# Patient Record
Sex: Female | Born: 1970 | Race: Black or African American | Hispanic: No | Smoking: Never smoker
Health system: Southern US, Community
[De-identification: ages and names within clinical notes are randomized; demographics above are authoritative.]

## PROBLEM LIST (undated history)

## (undated) DIAGNOSIS — F200 Paranoid schizophrenia: Secondary | ICD-10-CM

## (undated) DIAGNOSIS — B009 Herpesviral infection, unspecified: Secondary | ICD-10-CM

## (undated) DIAGNOSIS — D573 Sickle-cell trait: Secondary | ICD-10-CM

## (undated) DIAGNOSIS — F32A Depression, unspecified: Secondary | ICD-10-CM

## (undated) DIAGNOSIS — R945 Abnormal results of liver function studies: Secondary | ICD-10-CM

## (undated) DIAGNOSIS — K8020 Calculus of gallbladder without cholecystitis without obstruction: Secondary | ICD-10-CM

## (undated) DIAGNOSIS — B20 Human immunodeficiency virus [HIV] disease: Secondary | ICD-10-CM

## (undated) DIAGNOSIS — F329 Major depressive disorder, single episode, unspecified: Secondary | ICD-10-CM

## (undated) DIAGNOSIS — I10 Essential (primary) hypertension: Secondary | ICD-10-CM

## (undated) DIAGNOSIS — F209 Schizophrenia, unspecified: Secondary | ICD-10-CM

## (undated) DIAGNOSIS — R7989 Other specified abnormal findings of blood chemistry: Secondary | ICD-10-CM

## (undated) DIAGNOSIS — F29 Unspecified psychosis not due to a substance or known physiological condition: Secondary | ICD-10-CM

## (undated) HISTORY — DX: Depression, unspecified: F32.A

## (undated) HISTORY — DX: Sickle-cell trait: D57.3

## (undated) HISTORY — DX: Major depressive disorder, single episode, unspecified: F32.9

## (undated) HISTORY — DX: Paranoid schizophrenia: F20.0

## (undated) HISTORY — DX: Herpesviral infection, unspecified: B00.9

---

## 1999-05-28 ENCOUNTER — Encounter (INDEPENDENT_AMBULATORY_CARE_PROVIDER_SITE_OTHER): Payer: Self-pay | Admitting: *Deleted

## 1999-05-28 LAB — CONVERTED CEMR LAB
CD4 Count: 1140 microliters
CD4 T Cell Abs: 1140

## 2000-03-13 ENCOUNTER — Encounter: Payer: Self-pay | Admitting: Emergency Medicine

## 2000-03-13 ENCOUNTER — Emergency Department (HOSPITAL_COMMUNITY): Admission: EM | Admit: 2000-03-13 | Discharge: 2000-03-13 | Payer: Self-pay | Admitting: Emergency Medicine

## 2001-04-09 ENCOUNTER — Other Ambulatory Visit: Admission: RE | Admit: 2001-04-09 | Discharge: 2001-04-09 | Payer: Self-pay | Admitting: Obstetrics and Gynecology

## 2002-04-13 ENCOUNTER — Other Ambulatory Visit: Admission: RE | Admit: 2002-04-13 | Discharge: 2002-04-13 | Payer: Self-pay | Admitting: Obstetrics and Gynecology

## 2003-04-15 ENCOUNTER — Emergency Department (HOSPITAL_COMMUNITY): Admission: EM | Admit: 2003-04-15 | Discharge: 2003-04-15 | Payer: Self-pay | Admitting: Emergency Medicine

## 2003-05-17 ENCOUNTER — Other Ambulatory Visit: Admission: RE | Admit: 2003-05-17 | Discharge: 2003-05-17 | Payer: Self-pay | Admitting: Obstetrics and Gynecology

## 2005-07-05 ENCOUNTER — Ambulatory Visit: Payer: Self-pay | Admitting: Infectious Diseases

## 2005-08-01 ENCOUNTER — Ambulatory Visit: Payer: Self-pay | Admitting: Internal Medicine

## 2005-08-01 ENCOUNTER — Encounter: Admission: RE | Admit: 2005-08-01 | Discharge: 2005-08-01 | Payer: Self-pay | Admitting: Infectious Diseases

## 2005-08-01 ENCOUNTER — Encounter (INDEPENDENT_AMBULATORY_CARE_PROVIDER_SITE_OTHER): Payer: Self-pay | Admitting: *Deleted

## 2005-08-01 LAB — CONVERTED CEMR LAB
CD4 Count: 660 microliters
HIV 1 RNA Quant: 399 copies/mL

## 2005-08-08 ENCOUNTER — Encounter: Payer: Self-pay | Admitting: Internal Medicine

## 2005-08-14 ENCOUNTER — Ambulatory Visit: Payer: Self-pay | Admitting: Internal Medicine

## 2005-10-23 ENCOUNTER — Encounter (INDEPENDENT_AMBULATORY_CARE_PROVIDER_SITE_OTHER): Payer: Self-pay | Admitting: *Deleted

## 2005-10-23 ENCOUNTER — Ambulatory Visit: Payer: Self-pay | Admitting: Internal Medicine

## 2005-10-23 ENCOUNTER — Encounter: Admission: RE | Admit: 2005-10-23 | Discharge: 2005-10-23 | Payer: Self-pay | Admitting: Internal Medicine

## 2005-10-23 LAB — CONVERTED CEMR LAB
CD4 Count: 700 microliters
HIV 1 RNA Quant: 49 copies/mL

## 2005-11-06 ENCOUNTER — Ambulatory Visit: Payer: Self-pay | Admitting: Internal Medicine

## 2005-11-20 ENCOUNTER — Ambulatory Visit: Payer: Self-pay | Admitting: Internal Medicine

## 2005-12-17 ENCOUNTER — Encounter: Payer: Self-pay | Admitting: Internal Medicine

## 2005-12-27 DIAGNOSIS — F32A Depression, unspecified: Secondary | ICD-10-CM | POA: Insufficient documentation

## 2005-12-27 DIAGNOSIS — F329 Major depressive disorder, single episode, unspecified: Secondary | ICD-10-CM

## 2005-12-27 DIAGNOSIS — B20 Human immunodeficiency virus [HIV] disease: Secondary | ICD-10-CM | POA: Insufficient documentation

## 2006-01-20 ENCOUNTER — Encounter: Admission: RE | Admit: 2006-01-20 | Discharge: 2006-01-20 | Payer: Self-pay | Admitting: Internal Medicine

## 2006-01-20 ENCOUNTER — Encounter (INDEPENDENT_AMBULATORY_CARE_PROVIDER_SITE_OTHER): Payer: Self-pay | Admitting: *Deleted

## 2006-01-20 ENCOUNTER — Ambulatory Visit: Payer: Self-pay | Admitting: Internal Medicine

## 2006-01-20 LAB — CONVERTED CEMR LAB
CD4 Count: 690 microliters
HIV 1 RNA Quant: 49 copies/mL

## 2006-02-21 ENCOUNTER — Encounter: Payer: Self-pay | Admitting: Internal Medicine

## 2006-04-21 ENCOUNTER — Encounter (INDEPENDENT_AMBULATORY_CARE_PROVIDER_SITE_OTHER): Payer: Self-pay | Admitting: *Deleted

## 2006-04-21 LAB — CONVERTED CEMR LAB: Pap Smear: NEGATIVE

## 2006-05-04 ENCOUNTER — Encounter (INDEPENDENT_AMBULATORY_CARE_PROVIDER_SITE_OTHER): Payer: Self-pay | Admitting: *Deleted

## 2006-07-09 ENCOUNTER — Encounter: Payer: Self-pay | Admitting: Internal Medicine

## 2007-07-29 ENCOUNTER — Encounter: Admission: RE | Admit: 2007-07-29 | Discharge: 2007-07-29 | Payer: Self-pay | Admitting: Internal Medicine

## 2007-07-29 ENCOUNTER — Ambulatory Visit: Payer: Self-pay | Admitting: Internal Medicine

## 2007-07-29 LAB — CONVERTED CEMR LAB
ALT: 9 units/L (ref 0–35)
AST: 11 units/L (ref 0–37)
Albumin: 4.1 g/dL (ref 3.5–5.2)
Alkaline Phosphatase: 60 units/L (ref 39–117)
BUN: 12 mg/dL (ref 6–23)
Basophils Absolute: 0 10*3/uL (ref 0.0–0.1)
Basophils Relative: 1 % (ref 0–1)
CO2: 20 meq/L (ref 19–32)
Calcium: 8.9 mg/dL (ref 8.4–10.5)
Chlamydia, Swab/Urine, PCR: NEGATIVE
Chloride: 113 meq/L — ABNORMAL HIGH (ref 96–112)
Creatinine, Ser: 0.83 mg/dL (ref 0.40–1.20)
Eosinophils Absolute: 0 10*3/uL (ref 0.0–0.7)
Eosinophils Relative: 1 % (ref 0–5)
GC Probe Amp, Urine: NEGATIVE
Glucose, Bld: 89 mg/dL (ref 70–99)
HCT: 36.2 % (ref 36.0–46.0)
HIV 1 RNA Quant: <50 copies/mL (ref <50)
HIV-1 RNA Quant, Log: <1.7 (ref <1.70)
Hemoglobin: 12.2 g/dL (ref 12.0–15.0)
Lymphocytes Relative: 43 % (ref 12–46)
Lymphs Abs: 1.6 10*3/uL (ref 0.7–4.0)
MCHC: 33.7 g/dL (ref 30.0–36.0)
MCV: 87 fL (ref 78.0–100.0)
Monocytes Absolute: 0.4 10*3/uL (ref 0.1–1.0)
Monocytes Relative: 11 % (ref 3–12)
Neutro Abs: 1.6 10*3/uL — ABNORMAL LOW (ref 1.7–7.7)
Neutrophils Relative %: 45 % (ref 43–77)
Platelets: 239 10*3/uL (ref 150–400)
Potassium: 3.6 meq/L (ref 3.5–5.3)
RBC: 4.16 M/uL (ref 3.87–5.11)
RDW: 13.8 % (ref 11.5–15.5)
Sodium: 142 meq/L (ref 135–145)
Total Bilirubin: 0.4 mg/dL (ref 0.3–1.2)
Total Protein: 7.4 g/dL (ref 6.0–8.3)
WBC: 3.7 10*3/uL — ABNORMAL LOW (ref 4.0–10.5)

## 2007-08-06 ENCOUNTER — Encounter (INDEPENDENT_AMBULATORY_CARE_PROVIDER_SITE_OTHER): Payer: Self-pay | Admitting: *Deleted

## 2007-08-11 ENCOUNTER — Encounter (INDEPENDENT_AMBULATORY_CARE_PROVIDER_SITE_OTHER): Payer: Self-pay | Admitting: *Deleted

## 2007-08-12 ENCOUNTER — Ambulatory Visit: Payer: Self-pay | Admitting: Internal Medicine

## 2007-11-19 ENCOUNTER — Telehealth: Payer: Self-pay | Admitting: Internal Medicine

## 2008-09-06 ENCOUNTER — Ambulatory Visit: Payer: Self-pay | Admitting: Internal Medicine

## 2008-09-06 LAB — CONVERTED CEMR LAB
ALT: 13 units/L (ref 0–35)
AST: 15 units/L (ref 0–37)
Albumin: 4 g/dL (ref 3.5–5.2)
Alkaline Phosphatase: 63 units/L (ref 39–117)
BUN: 16 mg/dL (ref 6–23)
Basophils Absolute: 0 10*3/uL (ref 0.0–0.1)
Basophils Relative: 0 % (ref 0–1)
CO2: 20 meq/L (ref 19–32)
Calcium: 8.8 mg/dL (ref 8.4–10.5)
Chloride: 110 meq/L (ref 96–112)
Creatinine, Ser: 0.83 mg/dL (ref 0.40–1.20)
Eosinophils Absolute: 0 10*3/uL (ref 0.0–0.7)
Eosinophils Relative: 1 % (ref 0–5)
Glucose, Bld: 77 mg/dL (ref 70–99)
HCT: 34 % — ABNORMAL LOW (ref 36.0–46.0)
HIV 1 RNA Quant: <48 copies/mL (ref <48)
HIV-1 RNA Quant, Log: <1.68 (ref <1.68)
Hemoglobin: 11.4 g/dL — ABNORMAL LOW (ref 12.0–15.0)
Lymphocytes Relative: 32 % (ref 12–46)
Lymphs Abs: 1.1 10*3/uL (ref 0.7–4.0)
MCHC: 33.5 g/dL (ref 30.0–36.0)
MCV: 84.8 fL (ref 78.0–100.0)
Monocytes Absolute: 0.3 10*3/uL (ref 0.1–1.0)
Monocytes Relative: 10 % (ref 3–12)
Neutro Abs: 2 10*3/uL (ref 1.7–7.7)
Neutrophils Relative %: 57 % (ref 43–77)
Platelets: 229 10*3/uL (ref 150–400)
Potassium: 4.3 meq/L (ref 3.5–5.3)
RBC: 4.01 M/uL (ref 3.87–5.11)
RDW: 14.9 % (ref 11.5–15.5)
Sodium: 139 meq/L (ref 135–145)
Total Bilirubin: 0.4 mg/dL (ref 0.3–1.2)
Total Protein: 7 g/dL (ref 6.0–8.3)
WBC: 3.4 10*3/uL — ABNORMAL LOW (ref 4.0–10.5)

## 2008-09-21 ENCOUNTER — Ambulatory Visit: Payer: Self-pay | Admitting: Internal Medicine

## 2009-05-05 ENCOUNTER — Ambulatory Visit: Payer: Self-pay | Admitting: Internal Medicine

## 2009-05-05 DIAGNOSIS — N6459 Other signs and symptoms in breast: Secondary | ICD-10-CM | POA: Insufficient documentation

## 2009-05-05 LAB — CONVERTED CEMR LAB
HIV 1 RNA Quant: <48 copies/mL (ref <48)
HIV-1 RNA Quant, Log: <1.68 (ref <1.68)
Pap Smear: NEGATIVE

## 2009-05-09 ENCOUNTER — Encounter: Payer: Self-pay | Admitting: Internal Medicine

## 2009-05-10 DIAGNOSIS — E229 Hyperfunction of pituitary gland, unspecified: Secondary | ICD-10-CM | POA: Insufficient documentation

## 2009-05-10 LAB — CONVERTED CEMR LAB
ALT: 9 units/L (ref 0–35)
AST: 15 units/L (ref 0–37)
Albumin: 3.7 g/dL (ref 3.5–5.2)
Alkaline Phosphatase: 106 units/L (ref 39–117)
BUN: 18 mg/dL (ref 6–23)
Basophils Absolute: 0 10*3/uL (ref 0.0–0.1)
Basophils Relative: 0 % (ref 0–1)
CO2: 23 meq/L (ref 19–32)
Calcium: 8.8 mg/dL (ref 8.4–10.5)
Chloride: 108 meq/L (ref 96–112)
Creatinine, Ser: 0.81 mg/dL (ref 0.40–1.20)
Eosinophils Absolute: 0.1 10*3/uL (ref 0.0–0.7)
Eosinophils Relative: 1 % (ref 0–5)
Glucose, Bld: 89 mg/dL (ref 70–99)
HCT: 37.2 % (ref 36.0–46.0)
Hemoglobin: 12.3 g/dL (ref 12.0–15.0)
Lymphocytes Relative: 33 % (ref 12–46)
Lymphs Abs: 1.7 10*3/uL (ref 0.7–4.0)
MCHC: 33.1 g/dL (ref 30.0–36.0)
MCV: 84.9 fL (ref >=78.0)
Monocytes Absolute: 0.5 10*3/uL (ref 0.1–1.0)
Monocytes Relative: 9 % (ref 3–12)
Neutro Abs: 3 10*3/uL (ref 1.7–7.7)
Neutrophils Relative %: 57 % (ref 43–77)
Platelets: 203 10*3/uL (ref 150–400)
Potassium: 3.7 meq/L (ref 3.5–5.3)
Prolactin: 99.6 ng/mL
RBC: 4.38 M/uL (ref 3.87–5.11)
RDW: 13.6 % (ref 11.5–15.5)
Sodium: 140 meq/L (ref 135–145)
Total Bilirubin: 0.3 mg/dL (ref 0.3–1.2)
Total Protein: 6.7 g/dL (ref 6.0–8.3)
WBC: 5.3 10*3/uL (ref 4.0–10.5)

## 2009-05-12 ENCOUNTER — Encounter: Payer: Self-pay | Admitting: Internal Medicine

## 2009-05-16 LAB — CONVERTED CEMR LAB: hCG, Beta Chain, Quant, S: <2 milliintl units/mL

## 2009-05-17 ENCOUNTER — Encounter: Payer: Self-pay | Admitting: Internal Medicine

## 2009-05-19 ENCOUNTER — Ambulatory Visit: Payer: Self-pay | Admitting: Internal Medicine

## 2009-06-23 ENCOUNTER — Ambulatory Visit (HOSPITAL_COMMUNITY): Admission: RE | Admit: 2009-06-23 | Discharge: 2009-06-23 | Payer: Self-pay | Admitting: Internal Medicine

## 2009-12-07 ENCOUNTER — Ambulatory Visit: Payer: Self-pay | Admitting: Internal Medicine

## 2009-12-07 LAB — CONVERTED CEMR LAB
ALT: 12 units/L (ref 0–35)
AST: 13 units/L (ref 0–37)
Albumin: 3.9 g/dL (ref 3.5–5.2)
Alkaline Phosphatase: 137 units/L — ABNORMAL HIGH (ref 39–117)
BUN: 12 mg/dL (ref 6–23)
Basophils Absolute: 0 10*3/uL (ref 0.0–0.1)
Basophils Relative: 0 % (ref 0–1)
CO2: 26 meq/L (ref 19–32)
Calcium: 8.7 mg/dL (ref 8.4–10.5)
Chloride: 107 meq/L (ref 96–112)
Creatinine, Ser: 0.77 mg/dL (ref 0.40–1.20)
Eosinophils Absolute: 0.1 10*3/uL (ref 0.0–0.7)
Eosinophils Relative: 1 % (ref 0–5)
Glucose, Bld: 89 mg/dL (ref 70–99)
HCT: 36.3 % (ref 36.0–46.0)
HIV 1 RNA Quant: <20 copies/mL (ref <20)
HIV-1 RNA Quant, Log: <1.3 (ref <1.30)
Hemoglobin: 12 g/dL (ref 12.0–15.0)
Lymphocytes Relative: 27 % (ref 12–46)
Lymphs Abs: 1.7 10*3/uL (ref 0.7–4.0)
MCHC: 33.1 g/dL (ref 30.0–36.0)
MCV: 83.1 fL (ref 78.0–100.0)
Monocytes Absolute: 0.7 10*3/uL (ref 0.1–1.0)
Monocytes Relative: 10 % (ref 3–12)
Neutro Abs: 3.9 10*3/uL (ref 1.7–7.7)
Neutrophils Relative %: 62 % (ref 43–77)
Platelets: 229 10*3/uL (ref 150–400)
Potassium: 3.7 meq/L (ref 3.5–5.3)
RBC: 4.37 M/uL (ref 3.87–5.11)
RDW: 14.9 % (ref 11.5–15.5)
Sodium: 141 meq/L (ref 135–145)
Total Bilirubin: 0.4 mg/dL (ref 0.3–1.2)
Total Protein: 7.1 g/dL (ref 6.0–8.3)
WBC: 6.4 10*3/uL (ref 4.0–10.5)

## 2010-01-17 ENCOUNTER — Encounter (INDEPENDENT_AMBULATORY_CARE_PROVIDER_SITE_OTHER): Payer: Self-pay | Admitting: *Deleted

## 2010-03-18 ENCOUNTER — Encounter: Payer: Self-pay | Admitting: Internal Medicine

## 2010-03-29 NOTE — Miscellaneous (Signed)
  Clinical Lists Changes  Observations: Added new observation of YEARAIDSPOS: 2007  (01/17/2010 10:53) Added new observation of HIV STATUS: CDC-defined AIDS  (01/17/2010 10:53)

## 2010-03-29 NOTE — Miscellaneous (Signed)
Summary: Orders Update  Clinical Lists Changes  Orders: Added new Test order of T-CBC w/Diff [phone]) - Signed Added new Test order of T-CD4 [phone]) - Signed Added new Test order of T-Comprehensive Metabolic Panel [id number]) - Signed Added new Test order of T-HIV Viral Load [phone]) - Signed Added new Test order of T-RPR (Syphilis) ([id number]) - Signed

## 2010-03-29 NOTE — Letter (Signed)
Summary: OPCID: Flow Sheet  OPCID: Flow Sheet   Imported By: Florinda Marker 09/22/2008 15:33:13  _____________________________________________________________________  External Attachment:    Type:   Image     Comment:   External Document

## 2010-03-29 NOTE — Assessment & Plan Note (Signed)
Summary: CHECKUP/SB.   CC:  check up and Depression.  History of Present Illness: Pt here for lab results.  She wants to have another baby. She currently does not have a partner. She will look into artificial insemination. She did a rapid HIV test at home and it was negative.  Depression History:      The patient denies a depressed mood most of the day and a diminished interest in her usual daily activities.         Preventive Screening-Counseling & Management  Alcohol-Tobacco     Alcohol drinks/day: 0     Smoking Status: never  Caffeine-Diet-Exercise     Caffeine use/day: 0     Does Patient Exercise: yes     Type of exercise: walking     Exercise (avg: min/session): 30-60     Times/week: 5  Safety-Violence-Falls     Seat Belt Use: yes   Current Allergies (reviewed today): No known allergies  Review of Systems  The patient denies anorexia, fever, and weight loss.    Vital Signs:  Patient profile:   40 year old female Height:      67 inches (170.18 cm) Weight:      155.7 pounds (70.77 kg) BMI:     24.47 Temp:     97.9 degrees F (36.61 degrees C) oral Pulse rate:   83 / minute BP sitting:   100 / 64  (left arm)  Vitals Entered By: Baxter Hire) (September 21, 2008 10:09 AM) CC: check up, Depression Is Patient Diabetic? No Pain Assessment Patient in pain? no      Nutritional Status BMI of 19 -24 = normal Nutritional Status Detail appetite is good per patient  Have you ever been in a relationship where you felt threatened, hurt or afraid?No   Does patient need assistance? Functional Status Self care Ambulation Normal    Physical Exam  General:  alert, well-developed, well-nourished, and well-hydrated.   Head:  normocephalic and atraumatic.   Mouth:  pharynx pink and moist.   Lungs:  normal breath sounds.     Impression & Recommendations:  Problem # 1:  HIV DISEASE (ICD-042) Pt currently not on treatment.  We confirmed that she is HIV postive by  reviewing 2 postive wester blots in her chart.  I do not have an explanation for the negative rapid test unless it was not done correctly.  I discussed the need for using condoms. She will repeat her lbs in 6 months. Diagnostics Reviewed:  CD4: 480 (09/07/2008)   WBC: 3.4 (09/06/2008)   Hgb: 11.4 (09/06/2008)   HCT: 34.0 (09/06/2008)   Platelets: 229 (09/06/2008) HIV-1 RNA: <48 copies/mL (09/06/2008)   HBSAg: NO (04/21/2006)  Other Orders: Est. Patient Level III (48546) Future Orders: T-CD4SP ([facility]) (CD4SP) ... 03/20/2009 T-HIV Viral Load [id number]) ... 03/20/2009 T-Comprehensive Metabolic Panel [id number]) ... 03/20/2009 T-CBC w/Diff ([id number]) ... 03/20/2009  Patient Instructions: 1)  Please schedule a follow-up appointment in 6 months,2 weeks after labs. 2)  Schedule in PAP clinic
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
## 2010-03-29 NOTE — Progress Notes (Signed)
Summary: [place] another state  Phone Note Call from Patient   Caller: Patient Summary of Call: Patient called to let us know that she has moved to [place] and still wants to receive care here.  Her new number is [phone]. Initial call taken by: Paulo Fruit  BS,CPht II,MPH,  November 19, 2007 4:49 PM

## 2010-03-29 NOTE — Letter (Signed)
Summary: Adriana Fernandez  Adriana Fernandez   Imported By: Randon Goldsmith 09/08/2006 06:40:57  _____________________________________________________________________  External Attachment:    Type:   Image     Comment:   External Document

## 2010-03-29 NOTE — Progress Notes (Signed)
Summary: Office Visit  Office Visit   Imported By: Randon Goldsmith 09/08/2006 06:48:32  _____________________________________________________________________  External Attachment:    Type:   Image     Comment:   External Document

## 2010-03-29 NOTE — Miscellaneous (Signed)
Summary: clinical update/ryan white  Clinical Lists Changes  Observations: Added new observation of PAYOR: No Insurance (08/06/2007 15:09) Added new observation of AIDSDAP: Pending (08/06/2007 15:09) Added new observation of INCOMESOURCE: none (08/06/2007 15:09) Added new observation of HOUSEINCOME: 0  (08/06/2007 15:09) Added new observation of FINASSESSDT: 08/04/2007  (08/06/2007 15:09)

## 2010-03-29 NOTE — Miscellaneous (Signed)
Summary: Lab Orders  Clinical Lists Changes  Orders: Added new Test order of T-CBC w/Diff [id number]) - Signed Added new Test order of T-CD4 [id number]) - Signed Added new Test order of T-Comprehensive Metabolic Panel [id number]) - Signed Added new Test order of T-HIV Viral Load [phone]) - Signed Added new Test order of T-Chlamydia  Probe, urine [id number]) - Signed Added new Test order of T-GC Probe, urine [id number]) - Signed Added new Test order of T-RPR (Syphilis) ([id number]) - Signed

## 2010-03-29 NOTE — Letter (Signed)
Summary: Results Follow-up Letter  [facility]  [address]   [place], [place] [address]   Phone: [phone]  Fax: [phone]          May 09, 2009  [address] [place], [place]  [address]  Dear Ms. Mayford Knife,   The following are the results of your recent test(s):  Test     Result    Pap Smear     Normal_XXX___  Not Normal_____  Comments:  I will see you in a year for your next PAP Smear.  Thank you for allowing me to do your PAP smear.  Sincerely,    Jennet Maduro RN [facility]

## 2010-03-29 NOTE — Miscellaneous (Signed)
Summary: Orders Update  Clinical Lists Changes  Orders: Added new Test order of CT with Contrast (CT w/ contrast) - Signed 

## 2010-03-29 NOTE — Assessment & Plan Note (Signed)
Summary: 2WK F/U/VS   CC:  follow-up visit, lab results, complaining of diarrhea x 1-2 months, and Depression.  History of Present Illness: Pt here to get lab results. She has been having some diarrhea. She thinks it is due to her Psych meds.  She does c/o HAs.  No double vision.  Depression History:      The patient denies a depressed mood most of the day and a diminished interest in her usual daily activities.        The patient denies that she feels like life is not worth living, denies that she wishes that she were dead, and denies that she has thought about ending her life.        Preventive Screening-Counseling & Management  Alcohol-Tobacco     Alcohol drinks/day: 0     Smoking Status: never     Passive Smoke Exposure: yes  Caffeine-Diet-Exercise     Caffeine use/day: soda     Does Patient Exercise: yes     Type of exercise: walking goes to gym     Exercise (avg: min/session): 30-60     Times/week: 5      Sexual History:  currently monogamous.     Updated Prior Medication List: INVEGA 3 MG XR24H-TAB (PALIPERIDONE) one tablet one time a day LEXAPRO 10 MG TABS (ESCITALOPRAM OXALATE) take one tablet one time a day APLENZIN 174 MG XR24H-TAB (BUPROPION HBR) take one tablet one time a day CEREFOLIN 07-26-48-5 MG TABS (L-METHYLFOLATE-B12-B6-B2) take one tablet one time a day  Current Allergies (reviewed today): No known allergies  Past History:  Past Medical History: Last updated: 12/27/2005 Depression HIV disease  Review of Systems       The patient complains of headaches.  The patient denies anorexia, fever, weight loss, and vision loss.    Vital Signs:  Patient profile:   40 year old female Height:      67 inches (170.18 cm) Weight:      225.0 pounds (102.27 kg) BMI:     35.37 Temp:     97.8 degrees F (36.56 degrees C) oral Pulse rate:   77 / minute BP sitting:   108 / 65  (left arm)  Vitals Entered By: Wendall Mola CMA Duncan Dull) (May 19, 2009 3:27  PM) CC: follow-up visit, lab results, complaining of diarrhea x 1-2 months, Depression Is Patient Diabetic? No Pain Assessment Patient in pain? no      Nutritional Status BMI of > 30 = obese Nutritional Status Detail appetite "normal"  Have you ever been in a relationship where you felt threatened, hurt or afraid?No   Does patient need assistance? Functional Status Self care Ambulation Normal Comments no missed doses of meds per patient   Physical Exam  General:  alert, well-developed, well-nourished, and well-hydrated.   Head:  normocephalic and atraumatic.   Neurologic:  alert & oriented X3, cranial nerves II-XII intact, strength normal in all extremities, and gait normal.      Impression & Recommendations:  Problem # 1:  HIV DISEASE (ICD-042) Pt currently not on therapy and does not need to be at this time. She will f/u for labs in 6 months. Diagnostics Reviewed:  HIV: HIV positive - not AIDS (09/21/2008)   CD4: 820 (05/05/2009)   WBC: 5.3 (05/05/2009)   Hgb: 12.3 (05/05/2009)   HCT: 37.2 (05/05/2009)   Platelets: 203 (05/05/2009) HIV-1 RNA: <48 copies/mL (05/05/2009)   HBSAg: NO (04/21/2006)  Problem # 2:  HYPERPROLACTINEMIA (ICD-253.1) will obtain CT  scan to r/o pituitary adenoma.  Medications Added to Medication List This Visit: 1)  Invega 3 Mg Xr24h-tab (Paliperidone) .... One tablet one time a day 2)  Lexapro 10 Mg Tabs (Escitalopram oxalate) .... Take one tablet one time a day 3)  Aplenzin 174 Mg Xr24h-tab (Bupropion hbr) .... Take one tablet one time a day 4)  Cerefolin 07-26-48-5 Mg Tabs (L-methylfolate-b12-b6-b2) .... Take one tablet one time a day  Other Orders: Est. Patient Level III ([address]) Future Orders: T-CD4SP ([facility]) (CD4SP) ... 11/15/2009 T-HIV Viral Load [id number]) ... 11/15/2009 T-Comprehensive Metabolic Panel [id number]) ... 11/15/2009 T-CBC w/Diff ([id number]) ... 11/15/2009  Patient Instructions: 1)  Please schedule a follow-up  appointment in 6 months, 2 weeks after labs.  Process Orders Check Orders Results:     [facility]: ABN not required for this insurance Tests Sent for requisitioning (May 19, 2009 3:45 PM):     11/15/2009: [facility] -- T-HIV Viral Load [phone] (signed)     11/15/2009: [facility] -- T-Comprehensive Metabolic Panel [[id number]] (signed)     11/15/2009: [facility] -- [facility] w/Diff [[id number]] (signed)

## 2010-03-29 NOTE — Progress Notes (Signed)
Summary: Office Visit  Office Visit   Imported By: Randon Goldsmith 09/08/2006 06:46:55  _____________________________________________________________________  External Attachment:    Type:   Image     Comment:   External Document

## 2010-03-29 NOTE — Letter (Signed)
Summary: Generic Letter  [facility]  [address]   [place], [place] [address]   Phone: [phone]  Fax: [phone]         May 12, 2009  Adriana Fernandez [address] [place], [place]  [address]  Dear Ms. RAUTH,  This letter is a follow up to the telephone message I left today.  Enclosed is the information that is necessary to complete financial eligibility criteria for the MRI that Dr. Philipp Deputy has ordered.  She has ordered an MRI based on two recent abnormal blood tests results.  It is very important that you follow-up as soon as possible to complete this paperwork.  Please call Rudell Cobb @ [phone] if you have questions about the financial information she is requesting you bring to the clinic.  Please call [place] or I @ [phone] if you have questions about the MRI.  Thank you.  Sincerely,   Jennet Maduro RN [facility]

## 2010-03-29 NOTE — Miscellaneous (Signed)
Summary: clinical update/ryan white  Clinical Lists Changes  Observations: Added new observation of AIDSDAP: Yes 2009 (08/11/2007 10:01)

## 2010-03-29 NOTE — Assessment & Plan Note (Signed)
Summary: EST-CK/FU/MEDS/CFB   CC:  pt needs labwork and Depression.  History of Present Illness: Pt here for f/u. She has been feeling well.  She c/o some clear discharge from her left breast.  No tenderness and no masses palpated.  Depression History:      The patient denies a depressed mood most of the day and a diminished interest in her usual daily activities.        The patient denies that she feels like life is not worth living, denies that she wishes that she were dead, and denies that she has thought about ending her life.        Preventive Screening-Counseling & Management  Alcohol-Tobacco     Alcohol drinks/day: 0     Smoking Status: never     Passive Smoke Exposure: yes  Caffeine-Diet-Exercise     Caffeine use/day: soda     Does Patient Exercise: yes     Type of exercise: walking     Exercise (avg: min/session): 30-60     Times/week: 5  Hep-HIV-STD-Contraception     HIV Risk: no  Safety-Violence-Falls     Seat Belt Use: yes      Sexual History:  currently monogamous.     Current Allergies: No known allergies  Past History:  Past Medical History: Last updated: 12/27/2005 Depression HIV disease  Social History: Sexual History:  currently monogamous  Review of Systems  The patient denies anorexia, fever, and weight loss.    Vital Signs:  Patient profile:   40 year old female Height:      67 inches (170.18 cm) Weight:      223.3 pounds (101.50 kg) BMI:     35.10 Temp:     97.0 degrees F (36.11 degrees C) oral Pulse rate:   92 / minute BP sitting:   135 / 81  (left arm)  Vitals Entered By: Wendall Mola CMA Duncan Dull) (May 05, 2009 9:48 AM) CC: pt needs labwork, Depression Is Patient Diabetic? No Pain Assessment Patient in pain? no      Nutritional Status BMI of > 30 = obese Nutritional Status Detail appetite "good"  Does patient need assistance? Functional Status Self care Ambulation Normal Comments no missed doses of meds per  patient   Physical Exam  General:  alert, well-developed, well-nourished, and well-hydrated.   Head:  normocephalic and atraumatic.   Mouth:  pharynx pink and moist.   Breasts:  skin/areolae normal, no masses, no nipple discharge, and no tenderness.   Lungs:  normal breath sounds.      Impression & Recommendations:  Problem # 1:  HIV DISEASE (ICD-042) Pt currently not on therapy.  Will repeat labs today and have her f/u in 2 weeks. PAP today. Diagnostics Reviewed:  HIV: HIV positive - not AIDS (09/21/2008)   CD4: 480 (09/07/2008)   WBC: 3.4 (09/06/2008)   Hgb: 11.4 (09/06/2008)   HCT: 34.0 (09/06/2008)   Platelets: 229 (09/06/2008) HIV-1 RNA: <48 copies/mL (09/06/2008)   HBSAg: NO (04/21/2006)  Problem # 2:  NIPPLE DISCHARGE (ICD-611.79) will check a prolactin level. Orders: T-Prolactin [id number])  Other Orders: T-CD4SP ([facility]) (CD4SP) T-HIV Viral Load [id number]) T-Comprehensive Metabolic Panel [phone]) T-CBC w/Diff ([id number]) Est. Patient Level III ([address]) T-PAP [facility]) [phone]) Influenza Vaccine NON MCR ([address])  Patient Instructions: 1)  Please schedule a follow-up appointment in 2 weeks.  Process Orders Check Orders Results:     [facility]: ABN not required for this insurance Tests Sent for requisitioning (  May 05, 2009 2:29 PM):     05/05/2009: [facility] -- T-HIV Viral Load [id number] (signed)     05/05/2009: [facility] -- T-Comprehensive Metabolic Panel [[id number]] (signed)     05/05/2009: [facility] -- T-CBC w/Diff [[id number]] (signed)     05/05/2009: [facility] -- T-Prolactin [phone] (signed)       Immunizations Administered:  Influenza Vaccine # 1:    Vaccine Type: Fluvax Non-MCR    Site: left deltoid    Mfr: novartis    Dose: 0.5 ml    Route: IM    Given by: Wendall Mola CMA ( AAMA)    Exp. Date: 05/26/2009    Lot #:  [id number]  Flu Vaccine Consent Questions:    Do you have a history of severe allergic reactions to this vaccine? no    Any prior history of allergic reactions to egg and/or gelatin? no    Do you have a sensitivity to the preservative Thimersol? no    Do you have a past history of Guillan-Barre Syndrome? no    Do you currently have an acute febrile illness? no    Have you ever had a severe reaction to latex? no    Vaccine information given and explained to patient? yes    Are you currently pregnant? no

## 2010-04-30 ENCOUNTER — Other Ambulatory Visit: Payer: Self-pay

## 2010-05-10 LAB — T-HELPER CELL (CD4) - (RCID CLINIC ONLY)
CD4 % Helper T Cell: 47 % (ref 33–55)
CD4 T Cell Abs: 790 uL (ref 400–2700)

## 2010-05-14 ENCOUNTER — Ambulatory Visit: Payer: Self-pay | Admitting: Adult Health

## 2010-05-20 LAB — T-HELPER CELL (CD4) - (RCID CLINIC ONLY)
CD4 % Helper T Cell: 50 % (ref 33–55)
CD4 T Cell Abs: 820 uL (ref 400–2700)

## 2010-06-03 LAB — T-HELPER CELL (CD4) - (RCID CLINIC ONLY)
CD4 % Helper T Cell: 45 % (ref 33–55)
CD4 T Cell Abs: 480 uL (ref 400–2700)

## 2010-06-14 ENCOUNTER — Telehealth: Payer: Self-pay | Admitting: *Deleted

## 2010-06-14 NOTE — Telephone Encounter (Signed)
Wants an appt. Has been out of meds for over a year. Currently is not feeling well. C/o fatigue & HA. Told her she can use UC and to make 2 appts here. One for labs & one to see new provider. She agreed with plan

## 2010-06-26 ENCOUNTER — Other Ambulatory Visit: Payer: Self-pay

## 2010-06-27 ENCOUNTER — Other Ambulatory Visit: Payer: Medicaid Other

## 2010-06-27 DIAGNOSIS — B20 Human immunodeficiency virus [HIV] disease: Secondary | ICD-10-CM

## 2010-06-28 LAB — CBC WITH DIFFERENTIAL/PLATELET
Basophils Absolute: 0 10*3/uL (ref 0.0–0.1)
Basophils Relative: 0 % (ref 0–1)
Eosinophils Absolute: 0.1 10*3/uL (ref 0.0–0.7)
Eosinophils Relative: 1 % (ref 0–5)
HCT: 37.1 % (ref 36.0–46.0)
Hemoglobin: 12.2 g/dL (ref 12.0–15.0)
Lymphocytes Relative: 25 % (ref 12–46)
Lymphs Abs: 1.9 10*3/uL (ref 0.7–4.0)
MCH: 27.5 pg (ref 26.0–34.0)
MCHC: 32.9 g/dL (ref 30.0–36.0)
MCV: 83.7 fL (ref 78.0–100.0)
Monocytes Absolute: 0.6 10*3/uL (ref 0.1–1.0)
Monocytes Relative: 8 % (ref 3–12)
Neutro Abs: 4.9 10*3/uL (ref 1.7–7.7)
Neutrophils Relative %: 66 % (ref 43–77)
Platelets: 259 10*3/uL (ref 150–400)
RBC: 4.43 MIL/uL (ref 3.87–5.11)
RDW: 16.1 % — ABNORMAL HIGH (ref 11.5–15.5)
WBC: 7.5 10*3/uL (ref 4.0–10.5)

## 2010-06-28 LAB — COMPLETE METABOLIC PANEL WITH GFR
ALT: 15 U/L (ref 0–35)
AST: 16 U/L (ref 0–37)
Albumin: 4 g/dL (ref 3.5–5.2)
Alkaline Phosphatase: 128 U/L — ABNORMAL HIGH (ref 39–117)
BUN: 10 mg/dL (ref 6–23)
CO2: 25 mEq/L (ref 19–32)
Calcium: 9.1 mg/dL (ref 8.4–10.5)
Chloride: 107 mEq/L (ref 96–112)
Creat: 0.75 mg/dL (ref 0.40–1.20)
GFR, Est African American: >60 mL/min (ref >60)
GFR, Est Non African American: >60 mL/min (ref >60)
Glucose, Bld: 101 mg/dL — ABNORMAL HIGH (ref 70–99)
Potassium: 4.3 mEq/L (ref 3.5–5.3)
Sodium: 142 mEq/L (ref 135–145)
Total Bilirubin: 0.4 mg/dL (ref 0.3–1.2)
Total Protein: 7.5 g/dL (ref 6.0–8.3)

## 2010-06-28 LAB — HIV-1 RNA QUANT-NO REFLEX-BLD
HIV 1 RNA Quant: <20 copies/mL (ref <20)
HIV-1 RNA Quant, Log: <1.3 {Log} (ref <1.30)

## 2010-06-28 LAB — T-HELPER CELL (CD4) - (RCID CLINIC ONLY)
CD4 % Helper T Cell: 46 % (ref 33–55)
CD4 T Cell Abs: 890 uL (ref 400–2700)

## 2010-07-09 ENCOUNTER — Encounter: Payer: Self-pay | Admitting: Adult Health

## 2010-07-09 ENCOUNTER — Ambulatory Visit (INDEPENDENT_AMBULATORY_CARE_PROVIDER_SITE_OTHER): Payer: Self-pay | Admitting: Adult Health

## 2010-07-09 VITALS — BP 115/88 | HR 93 | Temp 97.9°F | Ht 67.75 in | Wt 254.2 lb

## 2010-07-09 DIAGNOSIS — B20 Human immunodeficiency virus [HIV] disease: Secondary | ICD-10-CM

## 2010-07-09 NOTE — Progress Notes (Signed)
  Subjective:    Patient ID: Adriana Fernandez, female    DOB: 23-Feb-1971, 40 y.o.   MRN: [id number]  HPI Presents for followup. Only chief complaint is "feeling tired." All the time. States she is adjusting, beginning therapy for HIV, and is willing to discuss possible participation in research.   Review of Systems  Constitutional: Positive for fatigue. Negative for fever, chills, diaphoresis, activity change, appetite change and unexpected weight change.  HENT: Negative for hearing loss, ear pain, nosebleeds, congestion, sore throat, facial swelling, rhinorrhea, sneezing, drooling, mouth sores, trouble swallowing, neck pain, neck stiffness, dental problem, voice change, postnasal drip, sinus pressure, tinnitus and ear discharge.   Eyes: Negative for photophobia, pain, discharge, redness, itching and visual disturbance.  Respiratory: Negative for apnea, cough, choking, chest tightness, shortness of breath, wheezing and stridor.   Cardiovascular: Negative for chest pain, palpitations and leg swelling.  Gastrointestinal: Negative for nausea, vomiting, abdominal pain, diarrhea, constipation, blood in stool, abdominal distention, anal bleeding and rectal pain.  Genitourinary: Negative for dysuria, urgency, frequency, hematuria, flank pain, decreased urine volume, vaginal bleeding, vaginal discharge, enuresis, difficulty urinating, genital sores, vaginal pain, menstrual problem, pelvic pain and dyspareunia.  Musculoskeletal: Negative for myalgias, back pain, joint swelling, arthralgias and gait problem.  Skin: Negative for color change, pallor, rash and wound.  Neurological: Negative for dizziness, tremors, seizures, syncope, facial asymmetry, speech difficulty, weakness, light-headedness, numbness and headaches.  Hematological: Negative for adenopathy. Does not bruise/bleed easily.  Psychiatric/Behavioral: Negative for suicidal ideas, hallucinations, behavioral problems, confusion, sleep disturbance,  self-injury, dysphoric mood, decreased concentration and agitation. The patient is not nervous/anxious and is not hyperactive.        Objective:   Physical Exam  Constitutional: She is oriented to person, place, and time. She appears well-developed. No distress.       Overweight appearing  HENT:  Head: Normocephalic and atraumatic.  Right Ear: External ear normal.  Left Ear: External ear normal.  Nose: Nose normal.  Mouth/Throat: Oropharynx is clear and moist.  Eyes: Conjunctivae and EOM are normal. Pupils are equal, round, and reactive to light.  Neck: Normal range of motion. Neck supple.  Cardiovascular: Normal rate, regular rhythm, normal heart sounds and intact distal pulses.   Pulmonary/Chest: Effort normal and breath sounds normal.  Abdominal: Soft. Bowel sounds are normal.  Musculoskeletal: Normal range of motion.  Neurological: She is alert and oriented to person, place, and time. No cranial nerve deficit. She exhibits normal muscle tone. Coordination normal.  Skin: Skin is warm and dry. No rash noted.  Psychiatric: She has a normal mood and affect. Her behavior is normal. Judgment and thought content normal.          Assessment & Plan:  1. HIV. Labs from 06/27/2010 show a CD4 count of 890 at 46% with an HIV-1. Viral load of less than 20 copies per mL. There are no records indicating an HIV-2 antibody test, which would be important in determining whether her undetectable viral load is a result of elite controller state or her HIV is relative to HIV-2 infection. We will obtain a repeat HIV 1/2 antibody test today and have her return to clinic 2 weeks for followup. If she is HIV-2. Negative then we will refer her back to research for evaluation in the START study. She verbally acknowledged this and agreed with plan of care.

## 2010-07-10 ENCOUNTER — Ambulatory Visit: Payer: Self-pay | Admitting: Adult Health

## 2010-07-12 LAB — HIV 1/2 CONFIRMATION
HIV-1 antibody: POSITIVE — AB
HIV-2 Ab: NEGATIVE

## 2010-07-24 ENCOUNTER — Ambulatory Visit: Payer: Self-pay | Admitting: Adult Health

## 2010-07-24 ENCOUNTER — Ambulatory Visit: Payer: Self-pay

## 2010-08-14 ENCOUNTER — Ambulatory Visit (INDEPENDENT_AMBULATORY_CARE_PROVIDER_SITE_OTHER): Payer: Self-pay | Admitting: Adult Health

## 2010-08-14 ENCOUNTER — Encounter: Payer: Self-pay | Admitting: Adult Health

## 2010-08-14 DIAGNOSIS — R5381 Other malaise: Secondary | ICD-10-CM

## 2010-08-14 DIAGNOSIS — R5383 Other fatigue: Secondary | ICD-10-CM

## 2010-08-14 DIAGNOSIS — B20 Human immunodeficiency virus [HIV] disease: Secondary | ICD-10-CM

## 2010-08-14 NOTE — Progress Notes (Signed)
  Subjective:    Patient ID: Adriana Fernandez, female    DOB: May 27, 1970, 40 y.o.   MRN: [id number]  HPI Presents for laboratory followup after repeating HIV-1 an HIV-2 antibody testing. Her chief complaint. Remains chronic, not exertional fatigue with weight gain. She does report being on Wellbutrin, Invega, and Cerefolin. She states that she stopped her Lexapro therapy.   Review of Systems  Constitutional: Positive for activity change, fatigue and unexpected weight change. Negative for fever, chills, diaphoresis and appetite change.  HENT: Negative.   Eyes: Negative.   Respiratory: Negative.   Cardiovascular: Negative.   Gastrointestinal: Negative.   Genitourinary: Negative.   Musculoskeletal: Negative.   Neurological: Negative.   Hematological: Negative.   Psychiatric/Behavioral: Negative.        Objective:   Physical Exam  Constitutional: She is oriented to person, place, and time. She appears well-developed and well-nourished. No distress.  HENT:  Head: Normocephalic and atraumatic.  Eyes: Conjunctivae and EOM are normal. Pupils are equal, round, and reactive to light.  Neck: Normal range of motion. Neck supple.  Cardiovascular: Normal rate and regular rhythm.   Pulmonary/Chest: Effort normal and breath sounds normal.  Abdominal: Soft. Bowel sounds are normal.  Musculoskeletal: Normal range of motion.  Neurological: She is alert and oriented to person, place, and time. No cranial nerve deficit. She exhibits normal muscle tone. Coordination normal.  Skin: Skin is warm and dry.  Psychiatric: She has a normal mood and affect. Her behavior is normal. Judgment and thought content normal.          Assessment & Plan:  1. HIV. CD4 count was approximately 800 with a viral load of less than 20 copies/mL. Remains treatment nave. 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   Therefore, have referred her to research team in order that we may collect her information when the unique controller. Study opens. Some time in September 2012.  2. Chronic Fatigue. Given her medication history, this may very well be associated with medications for depression. Recommend following up with her psychiatrist regarding this matter and seeing if dosage modifications may help her fatigue and weight change.  We will plan for followup in 5 months but sooner should she qualify for research study. She verbally acknowledged this information and agreed with plan of care.

## 2010-08-20 ENCOUNTER — Ambulatory Visit: Payer: Self-pay

## 2010-08-28 ENCOUNTER — Telehealth: Payer: Self-pay | Admitting: Licensed Clinical Social Worker

## 2010-08-28 NOTE — Telephone Encounter (Signed)
Patient wants her husband to start on Truvada for preventative medication, she states they wear condoms now but would like to try for a child soon.

## 2010-09-18 ENCOUNTER — Telehealth: Payer: Self-pay | Admitting: *Deleted

## 2010-09-18 NOTE — Telephone Encounter (Signed)
Told her her husband (who is HIV negative) wants to go on truvada as a precaution. Told her he would have to get that from his md. He is not seen here. She will have him contact his md

## 2010-11-22 LAB — T-HELPER CELL (CD4) - (RCID CLINIC ONLY)
CD4 % Helper T Cell: 43
CD4 T Cell Abs: 720

## 2011-01-07 ENCOUNTER — Other Ambulatory Visit: Payer: Self-pay

## 2011-01-07 DIAGNOSIS — B20 Human immunodeficiency virus [HIV] disease: Secondary | ICD-10-CM

## 2011-01-07 DIAGNOSIS — Z79899 Other long term (current) drug therapy: Secondary | ICD-10-CM

## 2011-01-07 DIAGNOSIS — Z113 Encounter for screening for infections with a predominantly sexual mode of transmission: Secondary | ICD-10-CM

## 2011-01-08 ENCOUNTER — Other Ambulatory Visit: Payer: Self-pay | Admitting: Internal Medicine

## 2011-01-08 ENCOUNTER — Other Ambulatory Visit (INDEPENDENT_AMBULATORY_CARE_PROVIDER_SITE_OTHER): Payer: Self-pay

## 2011-01-08 DIAGNOSIS — Z113 Encounter for screening for infections with a predominantly sexual mode of transmission: Secondary | ICD-10-CM

## 2011-01-08 DIAGNOSIS — Z79899 Other long term (current) drug therapy: Secondary | ICD-10-CM

## 2011-01-08 DIAGNOSIS — B20 Human immunodeficiency virus [HIV] disease: Secondary | ICD-10-CM

## 2011-01-08 LAB — LIPID PANEL
Cholesterol: 137 mg/dL (ref 0–200)
HDL: 33 mg/dL — ABNORMAL LOW (ref >39)
LDL Cholesterol: 95 mg/dL (ref 0–99)
Total CHOL/HDL Ratio: 4.2 Ratio
Triglycerides: 47 mg/dL (ref <150)
VLDL: 9 mg/dL (ref 0–40)

## 2011-01-08 LAB — COMPLETE METABOLIC PANEL WITH GFR
ALT: 14 U/L (ref 0–35)
AST: 14 U/L (ref 0–37)
Albumin: 4.1 g/dL (ref 3.5–5.2)
Alkaline Phosphatase: 125 U/L — ABNORMAL HIGH (ref 39–117)
BUN: 7 mg/dL (ref 6–23)
CO2: 27 mEq/L (ref 19–32)
Calcium: 9.2 mg/dL (ref 8.4–10.5)
Chloride: 106 mEq/L (ref 96–112)
Creat: 0.78 mg/dL (ref 0.50–1.10)
GFR, Est African American: >89 mL/min/{1.73_m2}
GFR, Est Non African American: >89 mL/min/{1.73_m2}
Glucose, Bld: 88 mg/dL (ref 70–99)
Potassium: 4.1 mEq/L (ref 3.5–5.3)
Sodium: 139 mEq/L (ref 135–145)
Total Bilirubin: 0.4 mg/dL (ref 0.3–1.2)
Total Protein: 7.5 g/dL (ref 6.0–8.3)

## 2011-01-08 LAB — CBC WITH DIFFERENTIAL/PLATELET
Basophils Absolute: 0 10*3/uL (ref 0.0–0.1)
Basophils Relative: 0 % (ref 0–1)
Eosinophils Absolute: 0 10*3/uL (ref 0.0–0.7)
Eosinophils Relative: 1 % (ref 0–5)
HCT: 38.1 % (ref 36.0–46.0)
Hemoglobin: 12.7 g/dL (ref 12.0–15.0)
Lymphocytes Relative: 27 % (ref 12–46)
Lymphs Abs: 1.6 10*3/uL (ref 0.7–4.0)
MCH: 28 pg (ref 26.0–34.0)
MCHC: 33.3 g/dL (ref 30.0–36.0)
MCV: 83.9 fL (ref 78.0–100.0)
Monocytes Absolute: 0.5 10*3/uL (ref 0.1–1.0)
Monocytes Relative: 9 % (ref 3–12)
Neutro Abs: 3.9 10*3/uL (ref 1.7–7.7)
Neutrophils Relative %: 64 % (ref 43–77)
Platelets: 283 10*3/uL (ref 150–400)
RBC: 4.54 MIL/uL (ref 3.87–5.11)
RDW: 14.9 % (ref 11.5–15.5)
WBC: 6.1 10*3/uL (ref 4.0–10.5)

## 2011-01-08 LAB — RPR

## 2011-01-09 LAB — HIV-1 RNA QUANT-NO REFLEX-BLD: HIV 1 RNA Quant: NOT DETECTED copies/mL (ref <20)

## 2011-01-09 LAB — T-HELPER CELL (CD4) - (RCID CLINIC ONLY)
CD4 % Helper T Cell: 44 % (ref 33–55)
CD4 T Cell Abs: 760 uL (ref 400–2700)

## 2011-01-21 ENCOUNTER — Ambulatory Visit: Payer: Self-pay | Admitting: Adult Health

## 2011-01-22 ENCOUNTER — Ambulatory Visit: Payer: Self-pay | Admitting: Internal Medicine

## 2011-01-22 ENCOUNTER — Ambulatory Visit: Payer: Self-pay | Admitting: Adult Health

## 2011-02-15 ENCOUNTER — Encounter: Payer: Self-pay | Admitting: *Deleted

## 2011-05-07 ENCOUNTER — Ambulatory Visit (INDEPENDENT_AMBULATORY_CARE_PROVIDER_SITE_OTHER): Payer: Self-pay | Admitting: Infectious Disease

## 2011-05-07 ENCOUNTER — Encounter: Payer: Self-pay | Admitting: Infectious Disease

## 2011-05-07 VITALS — BP 115/79 | HR 102 | Temp 98.1°F | Wt 233.0 lb

## 2011-05-07 DIAGNOSIS — B20 Human immunodeficiency virus [HIV] disease: Secondary | ICD-10-CM

## 2011-05-07 DIAGNOSIS — T50904A Poisoning by unspecified drugs, medicaments and biological substances, undetermined, initial encounter: Secondary | ICD-10-CM

## 2011-05-07 DIAGNOSIS — F329 Major depressive disorder, single episode, unspecified: Secondary | ICD-10-CM

## 2011-05-07 DIAGNOSIS — F3289 Other specified depressive episodes: Secondary | ICD-10-CM

## 2011-05-07 DIAGNOSIS — R040 Epistaxis: Secondary | ICD-10-CM | POA: Insufficient documentation

## 2011-05-07 DIAGNOSIS — L270 Generalized skin eruption due to drugs and medicaments taken internally: Secondary | ICD-10-CM

## 2011-05-07 NOTE — Assessment & Plan Note (Signed)
Due to prezista. She is not tolerant of benadryl. Advised her to try claritin. Desnt want to try steroids. Should dissapate with stopping the prezista.

## 2011-05-07 NOTE — Assessment & Plan Note (Signed)
Continue humidified air, afrin as well

## 2011-05-07 NOTE — Progress Notes (Signed)
Addended by: Randall Hiss on: 05/07/2011 05:26 PM   Modules accepted: Level of Service

## 2011-05-07 NOTE — Progress Notes (Addendum)
  Subjective:    Patient ID: Adriana Fernandez, female    DOB: 1970/12/10, 41 y.o.   MRN: [id number]  HPI  NOTE THE ORIGINAL NOTE WRITTEN FOR THIS PATIENT WAS IN ERROR. [facility] DID NOT HAVE AN ALLERGIC RXN TO ANTIVIRALS.   This is the correct note. 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 She maintains healthy Cd4 counts as well. She has done well and only recentl complaint has been epistaxis made worse with dry air and alleviated by moisture. She requests note to allow her to have humidifier at work.  Review of Systems  Constitutional: Negative for fever, chills, diaphoresis, activity change, appetite change, fatigue and unexpected weight change.  HENT: Positive for nosebleeds. Negative for congestion, sore throat, rhinorrhea, sneezing, trouble swallowing and sinus pressure.   Eyes: Negative for photophobia and visual disturbance.  Respiratory: Negative for cough, chest tightness, shortness of breath, wheezing and stridor.   Cardiovascular: Negative for chest pain, palpitations and leg swelling.  Gastrointestinal: Negative for nausea, vomiting, abdominal pain, diarrhea, constipation, blood in stool, abdominal distention and anal bleeding.  Genitourinary: Negative for dysuria, hematuria, flank pain and difficulty urinating.  Musculoskeletal: Negative for myalgias, back pain, joint swelling, arthralgias and gait problem.  Skin: Negative for color change, pallor, rash and wound.  Neurological: Negative for dizziness, tremors, weakness and light-headedness.  Hematological: Negative for adenopathy. Does not bruise/bleed easily.  Psychiatric/Behavioral: Negative for behavioral problems, confusion, sleep disturbance, dysphoric mood, decreased concentration and agitation.       Objective:   Physical Exam        Assessment & Plan:  Drug rash Due to  prezista. She is not tolerant of benadryl. Advised her to try claritin. Desnt want to try steroids. Should dissapate with stopping the prezista.  HIV DISEASE ELITE CONTROLLER NO NEED FOR ARV. Will consider ACTG study for elite controller  DEPRESSION stable  Epistaxis Continue humidified air, afrin as well

## 2011-05-07 NOTE — Assessment & Plan Note (Addendum)
ELITE CONTROLLER NO NEED FOR ARV. Will consider ACTG study for elite controller

## 2011-05-07 NOTE — Assessment & Plan Note (Addendum)
stable °

## 2011-05-07 NOTE — Progress Notes (Signed)
Subjective:    Patient ID: Adriana Fernandez, female    DOB: 12/08/1970, 41 y.o.   MRN: [id number]  HPI AA 41 year old with HIV previously well controlled on combivir, viread and kaletra change to prezista norvir combivir and viread to improve her GI side effects from [place].  After starting new regimen she developed an intensely pruritis rash on her arms, that extended to her abdomen, chest and legs. She stopped the ARV yesterday. She has no mucosal involvement or other systemic symptoms. After review of her past ARV and genotypes available (though most are in paper chart) I proposed changin her to twice daily isentress with continuation of her combivir and viread. She also wanted to restart her SSRI. I spent greater than 45 minutes with the patient including greater than 50% of time in face to face counsel of the patient and in coordination of their care.   Review of Systems  Constitutional: Negative for fever, chills, diaphoresis, activity change, appetite change, fatigue and unexpected weight change.  HENT: Negative for congestion, sore throat, rhinorrhea, sneezing, trouble swallowing and sinus pressure.   Eyes: Negative for photophobia and visual disturbance.  Respiratory: Negative for cough, chest tightness, shortness of breath, wheezing and stridor.   Cardiovascular: Negative for chest pain, palpitations and leg swelling.  Gastrointestinal: Negative for nausea, vomiting, abdominal pain, diarrhea, constipation, blood in stool, abdominal distention and anal bleeding.  Genitourinary: Negative for dysuria, hematuria, flank pain and difficulty urinating.  Musculoskeletal: Negative for myalgias, back pain, joint swelling, arthralgias and gait problem.  Skin: Positive for rash. Negative for color change, pallor and wound.  Neurological: Negative for dizziness, tremors, weakness and light-headedness.  Hematological: Negative for adenopathy. Does not bruise/bleed easily.  Psychiatric/Behavioral:  Negative for behavioral problems, confusion, sleep disturbance, dysphoric mood, decreased concentration and agitation.       Objective:   Physical Exam  Constitutional: She is oriented to person, place, and time. She appears well-developed and well-nourished. No distress.  HENT:  Head: Normocephalic and atraumatic.  Mouth/Throat: Oropharynx is clear and moist. No oropharyngeal exudate.  Eyes: Conjunctivae and EOM are normal. Pupils are equal, round, and reactive to light. No scleral icterus.  Neck: Normal range of motion. Neck supple. No JVD present.  Cardiovascular: Normal rate, regular rhythm and normal heart sounds.  Exam reveals no gallop and no friction rub.   No murmur heard. Pulmonary/Chest: Effort normal and breath sounds normal. No respiratory distress. She has no wheezes. She has no rales. She exhibits no tenderness.  Abdominal: She exhibits no distension and no mass. There is no tenderness. There is no rebound and no guarding.  Musculoskeletal: She exhibits no edema and no tenderness.  Lymphadenopathy:    She has no cervical adenopathy.  Neurological: She is alert and oriented to person, place, and time. She has normal reflexes. She exhibits normal muscle tone. Coordination normal.  Skin: Skin is warm and dry. Rash noted. She is not diaphoretic. No erythema. No pallor.     Psychiatric: She has a normal mood and affect. Her behavior is normal. Judgment and thought content normal.          Assessment & Plan:  Drug rash Due to prezista. She is not tolerant of benadryl. Advised her to try claritin. Desnt want to try steroids. Should dissapate with stopping the prezista.  HIV DISEASE Change to bid isentress with continued comibivir and viread. Check labs at the end of the month keeping in mind we dont know how high her VL  may have gone off her prior therapy  DEPRESSION Resume SSRI

## 2011-06-04 ENCOUNTER — Ambulatory Visit (INDEPENDENT_AMBULATORY_CARE_PROVIDER_SITE_OTHER): Payer: Self-pay | Admitting: *Deleted

## 2011-06-04 VITALS — BP 112/82 | HR 104 | Temp 97.8°F | Resp 16 | Ht 67.0 in | Wt 222.2 lb

## 2011-06-04 DIAGNOSIS — B20 Human immunodeficiency virus [HIV] disease: Secondary | ICD-10-CM

## 2011-06-04 NOTE — Progress Notes (Signed)
  Subjective:    Patient ID: Adriana Fernandez, female    DOB: 04/12/1970, 41 y.o.   MRN: [id number]  HPI    Review of Systems  Constitutional: Negative.   HENT: Negative.   Eyes: Negative.   Respiratory: Negative.   Cardiovascular: Negative.   Gastrointestinal: Negative.   Genitourinary: Negative.   Musculoskeletal: Negative.   Skin: Negative.   Neurological: Negative.   Hematological: Negative.   Psychiatric/Behavioral: Negative.    Reports an occassional headache, sometimes they are migraines since she was a teenager.    Objective:   Physical Exam  Constitutional: She is oriented to person, place, and time. She appears well-developed.  HENT:  Head: Normocephalic.  Mouth/Throat: Oropharynx is clear and moist.  Eyes: Conjunctivae are normal.  Cardiovascular: Normal rate, regular rhythm, normal heart sounds and intact distal pulses.        No pedal edema noted.  Pulmonary/Chest: Effort normal and breath sounds normal.  Abdominal: Soft. Bowel sounds are normal. There is no hepatomegaly.  Lymphadenopathy:    She has no cervical adenopathy.  Neurological: She is alert and oriented to person, place, and time. She has normal strength.       Reports occassional numbness in her feet, but usually related to position.  Skin: Skin is warm, dry and intact.          Assessment & Plan:

## 2011-06-04 NOTE — Progress Notes (Signed)
Patient here today to consent for study A5308, the elite controller study using complera. After the study was reviewed and explained to her and all items in the consent discussed, informed consent was obtained. She understands that she will be required to come for frequent visits and blood collections. Medication will start at week 12 and she has agreed to be part of the viral decay reservoir study. She denies any problems except for occasional headache, sometimes migraines. She says she is basically healthy and has not had any sx of disease since she was diagnosed in 86. She is not on any medications currently except for a diet supplement called "Slim Trim U" which does not contain any items listed on the prohibited med list. She is actively trying to lose weight. She will be starting a new job soon with [employer]. She plans to come in next Tuesday for entry on the study.

## 2011-06-05 ENCOUNTER — Telehealth: Payer: Self-pay

## 2011-06-05 LAB — CBC WITH DIFFERENTIAL/PLATELET
Basophils Absolute: 0 10*3/uL (ref 0.0–0.1)
Basophils Relative: 1 % (ref 0–1)
Eosinophils Absolute: 0.1 10*3/uL (ref 0.0–0.7)
Eosinophils Relative: 2 % (ref 0–5)
HCT: 39.6 % (ref 36.0–46.0)
Hemoglobin: 13.2 g/dL (ref 12.0–15.0)
Lymphocytes Relative: 32 % (ref 12–46)
Lymphs Abs: 2.2 10*3/uL (ref 0.7–4.0)
MCH: 27.7 pg (ref 26.0–34.0)
MCHC: 33.3 g/dL (ref 30.0–36.0)
MCV: 83 fL (ref 78.0–100.0)
Monocytes Absolute: 0.7 10*3/uL (ref 0.1–1.0)
Monocytes Relative: 11 % (ref 3–12)
Neutro Abs: 3.8 10*3/uL (ref 1.7–7.7)
Neutrophils Relative %: 55 % (ref 43–77)
Platelets: 285 10*3/uL (ref 150–400)
RBC: 4.77 MIL/uL (ref 3.87–5.11)
RDW: 14.4 % (ref 11.5–15.5)
WBC: 6.8 10*3/uL (ref 4.0–10.5)

## 2011-06-05 LAB — COMPREHENSIVE METABOLIC PANEL
ALT: 17 U/L (ref 0–35)
AST: 17 U/L (ref 0–37)
Albumin: 4.4 g/dL (ref 3.5–5.2)
Alkaline Phosphatase: 120 U/L — ABNORMAL HIGH (ref 39–117)
BUN: 12 mg/dL (ref 6–23)
CO2: 24 mEq/L (ref 19–32)
Calcium: 9.4 mg/dL (ref 8.4–10.5)
Chloride: 105 mEq/L (ref 96–112)
Creat: 1.07 mg/dL (ref 0.50–1.10)
Glucose, Bld: 89 mg/dL (ref 70–99)
Potassium: 4.1 mEq/L (ref 3.5–5.3)
Sodium: 139 mEq/L (ref 135–145)
Total Bilirubin: 0.3 mg/dL (ref 0.3–1.2)
Total Protein: 7.8 g/dL (ref 6.0–8.3)

## 2011-06-05 LAB — PREGNANCY, URINE: Preg Test, Ur: NEGATIVE

## 2011-06-05 LAB — HEPATITIS C ANTIBODY: HCV Ab: NEGATIVE

## 2011-06-05 LAB — PHOSPHORUS: Phosphorus: 3.4 mg/dL (ref 2.3–4.6)

## 2011-06-05 LAB — HEPATITIS B SURFACE ANTIGEN: Hepatitis B Surface Ag: NEGATIVE

## 2011-06-05 NOTE — Telephone Encounter (Signed)
Patient said she has collection agency calling her regarding lab bill for Nov. 2012  - she has not had [employer] since 2011. She is not on ADAP program - set-up appt for 06/11/11 as she is coming in to see [name] that day - will renew her Adriana Fernandez, then.

## 2011-06-06 LAB — HIV-1 RNA QUANT-NO REFLEX-BLD
HIV 1 RNA Quant: <20 copies/mL (ref <20)
HIV-1 RNA Quant, Log: <1.3 {Log} (ref <1.30)

## 2011-06-11 ENCOUNTER — Ambulatory Visit: Payer: Self-pay

## 2011-06-20 ENCOUNTER — Ambulatory Visit: Payer: Self-pay

## 2011-06-25 ENCOUNTER — Ambulatory Visit (INDEPENDENT_AMBULATORY_CARE_PROVIDER_SITE_OTHER): Payer: Self-pay | Admitting: *Deleted

## 2011-06-25 DIAGNOSIS — B20 Human immunodeficiency virus [HIV] disease: Secondary | ICD-10-CM

## 2011-06-25 LAB — POCT URINE PREGNANCY: Preg Test, Ur: NEGATIVE

## 2011-06-26 ENCOUNTER — Ambulatory Visit (INDEPENDENT_AMBULATORY_CARE_PROVIDER_SITE_OTHER): Payer: Self-pay | Admitting: *Deleted

## 2011-06-26 VITALS — BP 103/72 | HR 89 | Temp 98.6°F | Resp 16 | Ht 67.0 in | Wt 219.8 lb

## 2011-06-26 DIAGNOSIS — B20 Human immunodeficiency virus [HIV] disease: Secondary | ICD-10-CM

## 2011-06-26 LAB — CD4/CD8 (T-HELPER/T-SUPPRESSOR CELL)
CD4%: 50.6
CD4%: 860
CD8 % Suppressor T Cell: 28.1
CD8: 478

## 2011-06-26 LAB — HIV-1 RNA QUANT-NO REFLEX-BLD: HIV-1 RNA Viral Load: <40

## 2011-06-26 NOTE — Progress Notes (Signed)
Patient enrolled in the A5308 Elite Controller Study today. She denies any current complaints. We drew 185 ml of blood today without any problem. She will start complera at week 12 and has been reminded not to get any vaccinations within 7 days of study visits. She will return in 4 weeks.  She has recently started training for a position at [employer].

## 2011-07-26 ENCOUNTER — Encounter: Payer: Self-pay | Admitting: *Deleted

## 2011-08-02 ENCOUNTER — Encounter: Payer: Self-pay | Admitting: Infectious Disease

## 2011-08-21 ENCOUNTER — Ambulatory Visit (INDEPENDENT_AMBULATORY_CARE_PROVIDER_SITE_OTHER): Payer: Self-pay | Admitting: *Deleted

## 2011-08-21 DIAGNOSIS — B20 Human immunodeficiency virus [HIV] disease: Secondary | ICD-10-CM

## 2011-08-21 NOTE — Progress Notes (Signed)
Adriana Fernandez came in today because she said she was ready to get back on the research study for elite controllers. She had missed the week 4 visit, 4 weeks ago. She said that she had recently gotten out of [employer] for hearing voices and depression. She was in for 8 days and was diagnosed with bipolar depression and schizophrenia. I have requested her records from there. She says that she is not hearing voices anymore and believes that stress brought about the breakdown. She was in training for a position at [employer] and was struggling with the tests. She has since quit the position and is applying for disability. She has also reunited with her husband. She is now taking several different meds for psych. I told her that the study would not be in the best interest for her because of what she is going through and getting adjusted to her new meds. We will take her off study for now.

## 2011-08-27 ENCOUNTER — Ambulatory Visit (INDEPENDENT_AMBULATORY_CARE_PROVIDER_SITE_OTHER): Payer: Medicaid Other | Admitting: *Deleted

## 2011-08-27 VITALS — BP 91/62 | HR 83 | Temp 97.7°F | Resp 16 | Wt 214.0 lb

## 2011-08-27 DIAGNOSIS — B20 Human immunodeficiency virus [HIV] disease: Secondary | ICD-10-CM

## 2011-08-27 LAB — COMPREHENSIVE METABOLIC PANEL
ALT: 25 U/L (ref 0–35)
AST: 23 U/L (ref 0–37)
Albumin: 4.1 g/dL (ref 3.5–5.2)
Alkaline Phosphatase: 93 U/L (ref 39–117)
BUN: 16 mg/dL (ref 6–23)
CO2: 21 mEq/L (ref 19–32)
Calcium: 8.7 mg/dL (ref 8.4–10.5)
Chloride: 110 mEq/L (ref 96–112)
Creat: 0.73 mg/dL (ref 0.50–1.10)
Glucose, Bld: 69 mg/dL — ABNORMAL LOW (ref 70–99)
Potassium: 4 mEq/L (ref 3.5–5.3)
Sodium: 140 mEq/L (ref 135–145)
Total Bilirubin: 0.3 mg/dL (ref 0.3–1.2)
Total Protein: 7.3 g/dL (ref 6.0–8.3)

## 2011-08-27 LAB — PHOSPHORUS: Phosphorus: 3.2 mg/dL (ref 2.3–4.6)

## 2011-08-27 NOTE — Progress Notes (Signed)
Patient here for the final study visit. She is coming off study early because she is no longer eligible. She is now on meds that are prohibited on study. She says that she still hears voices once in awhile and is occasionally depressed and anxious. She will follow up with Dr. Daiva Eves in the clinic.

## 2011-09-17 ENCOUNTER — Telehealth: Payer: Self-pay

## 2011-10-14 LAB — HIV-1 RNA QUANT-NO REFLEX-BLD: HIV 1 RNA Quant: <40

## 2011-10-14 NOTE — Addendum Note (Signed)
Addended by: Phill Myron on: 10/14/2011 03:57 PM   Modules accepted: Orders

## 2011-11-19 ENCOUNTER — Ambulatory Visit: Payer: Self-pay | Admitting: Infectious Disease

## 2011-11-19 ENCOUNTER — Ambulatory Visit: Payer: Self-pay

## 2011-12-03 NOTE — Telephone Encounter (Signed)
[id number] 

## 2011-12-04 ENCOUNTER — Ambulatory Visit: Payer: Self-pay | Admitting: Infectious Disease

## 2011-12-09 ENCOUNTER — Encounter: Payer: Self-pay | Admitting: Infectious Disease

## 2011-12-09 ENCOUNTER — Ambulatory Visit (INDEPENDENT_AMBULATORY_CARE_PROVIDER_SITE_OTHER): Payer: Self-pay | Admitting: Infectious Disease

## 2011-12-09 ENCOUNTER — Ambulatory Visit: Payer: Self-pay

## 2011-12-09 VITALS — BP 118/77 | HR 80 | Temp 98.3°F | Ht 67.0 in | Wt 221.0 lb

## 2011-12-09 DIAGNOSIS — F209 Schizophrenia, unspecified: Secondary | ICD-10-CM

## 2011-12-09 DIAGNOSIS — Z113 Encounter for screening for infections with a predominantly sexual mode of transmission: Secondary | ICD-10-CM

## 2011-12-09 DIAGNOSIS — Z23 Encounter for immunization: Secondary | ICD-10-CM

## 2011-12-09 DIAGNOSIS — B20 Human immunodeficiency virus [HIV] disease: Secondary | ICD-10-CM

## 2011-12-09 LAB — LIPID PANEL
Cholesterol: 126 mg/dL (ref 0–200)
HDL: 41 mg/dL (ref >39)
LDL Cholesterol: 75 mg/dL (ref 0–99)
Total CHOL/HDL Ratio: 3.1 Ratio
Triglycerides: 50 mg/dL (ref <150)
VLDL: 10 mg/dL (ref 0–40)

## 2011-12-09 LAB — CBC WITH DIFFERENTIAL/PLATELET
Basophils Absolute: 0 10*3/uL (ref 0.0–0.1)
Basophils Relative: 0 % (ref 0–1)
Eosinophils Absolute: 0.1 10*3/uL (ref 0.0–0.7)
Eosinophils Relative: 2 % (ref 0–5)
HCT: 36.5 % (ref 36.0–46.0)
Hemoglobin: 12.4 g/dL (ref 12.0–15.0)
Lymphocytes Relative: 33 % (ref 12–46)
Lymphs Abs: 1.6 10*3/uL (ref 0.7–4.0)
MCH: 27.6 pg (ref 26.0–34.0)
MCHC: 34 g/dL (ref 30.0–36.0)
MCV: 81.1 fL (ref 78.0–100.0)
Monocytes Absolute: 0.5 10*3/uL (ref 0.1–1.0)
Monocytes Relative: 10 % (ref 3–12)
Neutro Abs: 2.6 10*3/uL (ref 1.7–7.7)
Neutrophils Relative %: 55 % (ref 43–77)
Platelets: 271 10*3/uL (ref 150–400)
RBC: 4.5 MIL/uL (ref 3.87–5.11)
RDW: 14.1 % (ref 11.5–15.5)
WBC: 4.8 10*3/uL (ref 4.0–10.5)

## 2011-12-09 LAB — COMPLETE METABOLIC PANEL WITH GFR
ALT: 12 U/L (ref 0–35)
AST: 13 U/L (ref 0–37)
Albumin: 3.8 g/dL (ref 3.5–5.2)
Alkaline Phosphatase: 93 U/L (ref 39–117)
BUN: 7 mg/dL (ref 6–23)
CO2: 26 mEq/L (ref 19–32)
Calcium: 9 mg/dL (ref 8.4–10.5)
Chloride: 105 mEq/L (ref 96–112)
Creat: 0.74 mg/dL (ref 0.50–1.10)
GFR, Est African American: >89 mL/min
GFR, Est Non African American: >89 mL/min
Glucose, Bld: 87 mg/dL (ref 70–99)
Potassium: 4.2 mEq/L (ref 3.5–5.3)
Sodium: 138 mEq/L (ref 135–145)
Total Bilirubin: 0.2 mg/dL — ABNORMAL LOW (ref 0.3–1.2)
Total Protein: 6.7 g/dL (ref 6.0–8.3)

## 2011-12-09 MED ORDER — PNEUMOCOCCAL VAC POLYVALENT 25 MCG/0.5ML IJ INJ: 0.5000 mL | INJECTION | Freq: Once | INTRAMUSCULAR | Status: DC

## 2011-12-09 NOTE — Addendum Note (Signed)
Addended by: Emilio Aspen on: 12/09/2011 10:28 AM   Modules accepted: Orders

## 2011-12-09 NOTE — Addendum Note (Signed)
Addended by: Starleen Arms D on: 12/09/2011 10:07 AM   Modules accepted: Orders

## 2011-12-09 NOTE — Assessment & Plan Note (Signed)
Elite controller. Check VL today and bring back in 6 months time for repeat labs

## 2011-12-09 NOTE — Assessment & Plan Note (Signed)
Well controlled at present

## 2011-12-09 NOTE — Addendum Note (Signed)
Addended by: Mariea Clonts D on: 12/09/2011 10:30 AM   Modules accepted: Orders

## 2011-12-09 NOTE — Progress Notes (Signed)
  Subjective:    Patient ID: Adriana Fernandez, female    DOB: 08-17-1970, 41 y.o.   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 She was enrolled in ACTG trial for this but then could not stay in trial due to drugs she needed for her schizophrenia. I spent greater than 45 minutes with the patient including greater than 50% of time in face to face counsel of the patient and in coordination of their care.   Review of Systems  Constitutional: Negative for fever, chills, diaphoresis, activity change, appetite change, fatigue and unexpected weight change.  HENT: Negative for congestion, sore throat, rhinorrhea, sneezing, trouble swallowing and sinus pressure.   Eyes: Negative for photophobia and visual disturbance.  Respiratory: Negative for cough, chest tightness, shortness of breath, wheezing and stridor.   Cardiovascular: Negative for chest pain, palpitations and leg swelling.  Gastrointestinal: Negative for nausea, vomiting, abdominal pain, diarrhea, constipation, blood in stool, abdominal distention and anal bleeding.  Genitourinary: Negative for dysuria, hematuria, flank pain and difficulty urinating.  Musculoskeletal: Negative for myalgias, back pain, joint swelling, arthralgias and gait problem.  Skin: Negative for color change, pallor, rash and wound.  Neurological: Negative for dizziness, tremors, weakness and light-headedness.  Hematological: Negative for adenopathy. Does not bruise/bleed easily.  Psychiatric/Behavioral: Negative for behavioral problems, confusion, disturbed wake/sleep cycle, dysphoric mood, decreased concentration and agitation.       Objective:   Physical Exam  Constitutional: She is oriented to person, place, and time. She appears well-developed and well-nourished. No distress.  HENT:  Head: Normocephalic and atraumatic.  Mouth/Throat: Oropharynx is clear and moist.  No oropharyngeal exudate.  Eyes: Conjunctivae normal and EOM are normal. Pupils are equal, round, and reactive to light. No scleral icterus.  Neck: Normal range of motion. Neck supple. No JVD present.  Cardiovascular: Normal rate, regular rhythm and normal heart sounds.  Exam reveals no gallop and no friction rub.   No murmur heard. Pulmonary/Chest: Effort normal and breath sounds normal. No respiratory distress. She has no wheezes. She has no rales. She exhibits no tenderness.  Abdominal: She exhibits no distension and no mass. There is no tenderness. There is no rebound and no guarding.  Musculoskeletal: She exhibits no edema and no tenderness.  Lymphadenopathy:    She has no cervical adenopathy.  Neurological: She is alert and oriented to person, place, and time. She has normal reflexes. She exhibits normal muscle tone. Coordination normal.  Skin: Skin is warm and dry. She is not diaphoretic. No erythema. No pallor.  Psychiatric: She has a normal mood and affect. Judgment and thought content normal.          Assessment & Plan:  HIV DISEASE Elite controller. Check VL today and bring back in 6 months time for repeat labs  Schizophrenia Well controlled at present

## 2011-12-10 LAB — HIV-1 RNA QUANT-NO REFLEX-BLD
HIV 1 RNA Quant: <20 copies/mL (ref <20)
HIV-1 RNA Quant, Log: <1.3 {Log} (ref <1.30)

## 2011-12-10 LAB — RPR

## 2011-12-10 LAB — T-HELPER CELL (CD4) - (RCID CLINIC ONLY)
CD4 % Helper T Cell: 45 % (ref 33–55)
CD4 T Cell Abs: 740 uL (ref 400–2700)

## 2012-03-15 ENCOUNTER — Emergency Department (HOSPITAL_BASED_OUTPATIENT_CLINIC_OR_DEPARTMENT_OTHER): Payer: Medicaid Other

## 2012-03-15 ENCOUNTER — Encounter (HOSPITAL_BASED_OUTPATIENT_CLINIC_OR_DEPARTMENT_OTHER): Payer: Self-pay | Admitting: *Deleted

## 2012-03-15 ENCOUNTER — Emergency Department (HOSPITAL_BASED_OUTPATIENT_CLINIC_OR_DEPARTMENT_OTHER)
Admission: EM | Admit: 2012-03-15 | Discharge: 2012-03-15 | Disposition: A | Discharge status: Other Acute Inpatient Hospital | Payer: Medicaid Other | Attending: Emergency Medicine | Admitting: Emergency Medicine

## 2012-03-15 DIAGNOSIS — Z21 Asymptomatic human immunodeficiency virus [HIV] infection status: Secondary | ICD-10-CM | POA: Insufficient documentation

## 2012-03-15 DIAGNOSIS — Z79899 Other long term (current) drug therapy: Secondary | ICD-10-CM | POA: Insufficient documentation

## 2012-03-15 DIAGNOSIS — F209 Schizophrenia, unspecified: Secondary | ICD-10-CM | POA: Insufficient documentation

## 2012-03-15 DIAGNOSIS — R61 Generalized hyperhidrosis: Secondary | ICD-10-CM | POA: Insufficient documentation

## 2012-03-15 DIAGNOSIS — R350 Frequency of micturition: Secondary | ICD-10-CM | POA: Insufficient documentation

## 2012-03-15 DIAGNOSIS — IMO0001 Reserved for inherently not codable concepts without codable children: Secondary | ICD-10-CM | POA: Insufficient documentation

## 2012-03-15 DIAGNOSIS — J3489 Other specified disorders of nose and nasal sinuses: Secondary | ICD-10-CM | POA: Insufficient documentation

## 2012-03-15 DIAGNOSIS — R5381 Other malaise: Secondary | ICD-10-CM | POA: Insufficient documentation

## 2012-03-15 DIAGNOSIS — F29 Unspecified psychosis not due to a substance or known physiological condition: Secondary | ICD-10-CM | POA: Insufficient documentation

## 2012-03-15 DIAGNOSIS — N10 Acute tubulo-interstitial nephritis: Secondary | ICD-10-CM | POA: Insufficient documentation

## 2012-03-15 DIAGNOSIS — Z791 Long term (current) use of non-steroidal anti-inflammatories (NSAID): Secondary | ICD-10-CM | POA: Insufficient documentation

## 2012-03-15 DIAGNOSIS — R300 Dysuria: Secondary | ICD-10-CM | POA: Insufficient documentation

## 2012-03-15 DIAGNOSIS — R51 Headache: Secondary | ICD-10-CM | POA: Insufficient documentation

## 2012-03-15 DIAGNOSIS — R5383 Other fatigue: Secondary | ICD-10-CM | POA: Insufficient documentation

## 2012-03-15 HISTORY — DX: Human immunodeficiency virus (HIV) disease: B20

## 2012-03-15 HISTORY — DX: Unspecified psychosis not due to a substance or known physiological condition: F29

## 2012-03-15 HISTORY — DX: Schizophrenia, unspecified: F20.9

## 2012-03-15 LAB — CBC WITH DIFFERENTIAL/PLATELET
Basophils Absolute: 0 10*3/uL (ref 0.0–0.1)
Basophils Relative: 0 % (ref 0–1)
Eosinophils Absolute: 0 10*3/uL (ref 0.0–0.7)
Eosinophils Relative: 0 % (ref 0–5)
HCT: 35.2 % — ABNORMAL LOW (ref 36.0–46.0)
Hemoglobin: 12.2 g/dL (ref 12.0–15.0)
Lymphocytes Relative: 8 % — ABNORMAL LOW (ref 12–46)
Lymphs Abs: 1 10*3/uL (ref 0.7–4.0)
MCH: 28.2 pg (ref 26.0–34.0)
MCHC: 34.7 g/dL (ref 30.0–36.0)
MCV: 81.3 fL (ref 78.0–100.0)
Monocytes Absolute: 1.4 10*3/uL — ABNORMAL HIGH (ref 0.1–1.0)
Monocytes Relative: 11 % (ref 3–12)
Neutro Abs: 10.1 10*3/uL — ABNORMAL HIGH (ref 1.7–7.7)
Neutrophils Relative %: 81 % — ABNORMAL HIGH (ref 43–77)
Platelets: 187 10*3/uL (ref 150–400)
RBC: 4.33 MIL/uL (ref 3.87–5.11)
RDW: 14.1 % (ref 11.5–15.5)
WBC: 12.5 10*3/uL — ABNORMAL HIGH (ref 4.0–10.5)

## 2012-03-15 LAB — COMPREHENSIVE METABOLIC PANEL
ALT: 16 U/L (ref 0–35)
AST: 14 U/L (ref 0–37)
Albumin: 3.6 g/dL (ref 3.5–5.2)
Alkaline Phosphatase: 92 U/L (ref 39–117)
BUN: 7 mg/dL (ref 6–23)
CO2: 22 mEq/L (ref 19–32)
Calcium: 9.1 mg/dL (ref 8.4–10.5)
Chloride: 101 mEq/L (ref 96–112)
Creatinine, Ser: 1.1 mg/dL (ref 0.50–1.10)
GFR calc Af Amer: 72 mL/min — ABNORMAL LOW (ref >90)
GFR calc non Af Amer: 62 mL/min — ABNORMAL LOW (ref >90)
Glucose, Bld: 200 mg/dL — ABNORMAL HIGH (ref 70–99)
Potassium: 3.6 mEq/L (ref 3.5–5.1)
Sodium: 136 mEq/L (ref 135–145)
Total Bilirubin: 0.5 mg/dL (ref 0.3–1.2)
Total Protein: 8.3 g/dL (ref 6.0–8.3)

## 2012-03-15 LAB — PREGNANCY, URINE: Preg Test, Ur: NEGATIVE

## 2012-03-15 LAB — URINALYSIS, ROUTINE W REFLEX MICROSCOPIC
Glucose, UA: NEGATIVE mg/dL
Ketones, ur: 15 mg/dL — AB
Nitrite: NEGATIVE
Protein, ur: 100 mg/dL — AB
Specific Gravity, Urine: 1.02 (ref 1.005–1.030)
Urobilinogen, UA: 1 mg/dL (ref 0.0–1.0)
pH: 5 (ref 5.0–8.0)

## 2012-03-15 LAB — URINE MICROSCOPIC-ADD ON

## 2012-03-15 LAB — LACTIC ACID, PLASMA: Lactic Acid, Venous: 1.6 mmol/L (ref 0.5–2.2)

## 2012-03-15 MED ORDER — CEFTRIAXONE SODIUM 1 G IJ SOLR
1.0000 g | Freq: Once | INTRAMUSCULAR | Status: AC
  Administered 2012-03-15: 1 g via INTRAVENOUS
  Filled 2012-03-15: qty 10

## 2012-03-15 MED ORDER — IBUPROFEN 400 MG PO TABS
600.0000 mg | ORAL_TABLET | Freq: Once | ORAL | Status: AC
  Administered 2012-03-15: 600 mg via ORAL
  Filled 2012-03-15: qty 1

## 2012-03-15 MED ORDER — SODIUM CHLORIDE 0.9 % IV BOLUS (SEPSIS)
1000.0000 mL | Freq: Once | INTRAVENOUS | Status: AC
  Administered 2012-03-15: 1000 mL via INTRAVENOUS

## 2012-03-15 MED ORDER — ACETAMINOPHEN 325 MG PO TABS
ORAL_TABLET | ORAL | Status: AC
  Administered 2012-03-15: 650 mg
  Filled 2012-03-15: qty 2

## 2012-03-15 NOTE — ED Notes (Signed)
Patient states that she took 4 extra strength tylenol this morning at 9 am.

## 2012-03-15 NOTE — ED Notes (Signed)
Patient transported to X-ray 

## 2012-03-15 NOTE — ED Provider Notes (Signed)
History     CSN: [id number]  Arrival date & time 03/15/12  1118   First MD Initiated Contact with Patient 03/15/12 1134      Chief Complaint  Patient presents with  . Fever    (Consider location/radiation/quality/duration/timing/severity/associated sxs/prior treatment) HPI Pt with 2 days of chills, sweats, frontal HA, dysuria, frequency. No neck pain or stiffness. No cough, fever, CP, SOB. Denies abd pain, N/V/D. No focal weakness or sensory changes.  Past Medical History  Diagnosis Date  . HIV disease   . Schizophrenia   . Psychosis     History reviewed. No pertinent past surgical history.  No family history on file.  History  Substance Use Topics  . Smoking status: Never Smoker   . Smokeless tobacco: Never Used  . Alcohol Use: No    OB History    Grav Para Term Preterm Abortions TAB SAB Ect Mult Living                  Review of Systems  Constitutional: Positive for chills, diaphoresis and fatigue. Negative for fever.  HENT: Positive for sinus pressure. Negative for congestion, sore throat, rhinorrhea, neck pain and neck stiffness.   Eyes: Negative for visual disturbance.  Respiratory: Negative for cough, shortness of breath and wheezing.   Cardiovascular: Negative for chest pain, palpitations and leg swelling.  Gastrointestinal: Negative for nausea, vomiting, abdominal pain and diarrhea.  Genitourinary: Positive for dysuria and frequency.  Musculoskeletal: Positive for myalgias. Negative for back pain and arthralgias.  Skin: Negative for pallor, rash and wound.  Neurological: Positive for headaches. Negative for dizziness, seizures, syncope, weakness and numbness.  All other systems reviewed and are negative.    Allergies  Latex  Home Medications   Current Outpatient Rx  Name  Route  Sig  Dispense  Refill  . BUPROPION HBR ER 174 MG PO TB24   Oral   Take 1 tablet by mouth daily.          . CELECOXIB 200 MG PO CAPS   Oral   Take 200 mg by mouth  2 (two) times daily.         Marland Kitchen ESCITALOPRAM OXALATE 10 MG PO TABS   Oral   Take 10 mg by mouth daily.           . L-METHYLFOLATE-B12-B6-B2 07-26-48-5 MG PO TABS   Oral   Take 1 tablet by mouth daily.           Marland Kitchen LORAZEPAM 0.5 MG PO TABS   Oral   Take 0.5 mg by mouth every 8 (eight) hours.         Marland Kitchen PALIPERIDONE ER 3 MG PO TB24   Oral   Take 3 mg by mouth daily.          Marland Kitchen PANTOPRAZOLE SODIUM 40 MG PO TBEC   Oral   Take 40 mg by mouth daily.         . TOPIRAMATE 50 MG PO TABS   Oral   Take 50 mg by mouth 2 (two) times daily.           BP 125/78  Pulse 138  Temp 102.9 F (39.4 C) (Rectal)  Resp 20  SpO2 96%  Physical Exam  Nursing note and vitals reviewed. Constitutional: She is oriented to person, place, and time. She appears well-developed and well-nourished. No distress.  HENT:  Head: Normocephalic and atraumatic.  Mouth/Throat: Oropharynx is clear and moist.  Tenderness to percussion over frontal sinuses  Eyes: EOM are normal. Pupils are equal, round, and reactive to light.  Neck: Normal range of motion. Neck supple.       No meningismus  Cardiovascular: Regular rhythm.        Tahcycardia  Pulmonary/Chest: Effort normal and breath sounds normal. No respiratory distress. She has no wheezes. She has no rales. She exhibits no tenderness.  Abdominal: Soft. Bowel sounds are normal. She exhibits no mass. There is no tenderness. There is no rebound and no guarding.  Musculoskeletal: Normal range of motion. She exhibits no edema and no tenderness.       No CVAT  Lymphadenopathy:    She has no cervical adenopathy.  Neurological: She is alert and oriented to person, place, and time.       5/5 motor in all ext, sensation intact  Skin: Skin is warm and dry. No rash noted. No erythema.  Psychiatric: She has a normal mood and affect. Her behavior is normal.    ED Course  Procedures (including critical care time)  Labs Reviewed  CBC WITH DIFFERENTIAL  - Abnormal; Notable for the following:    WBC 12.5 (*)     HCT 35.2 (*)     Neutrophils Relative 81 (*)     Neutro Abs 10.1 (*)     Lymphocytes Relative 8 (*)     Monocytes Absolute 1.4 (*)     All other components within normal limits  COMPREHENSIVE METABOLIC PANEL - Abnormal; Notable for the following:    Glucose, Bld 200 (*)     GFR calc non Af Amer 62 (*)     GFR calc Af Amer 72 (*)     All other components within normal limits  URINALYSIS, ROUTINE W REFLEX MICROSCOPIC - Abnormal; Notable for the following:    Color, Urine AMBER (*)  BIOCHEMICALS MAY BE AFFECTED BY COLOR   APPearance TURBID (*)     Hgb urine dipstick LARGE (*)     Bilirubin Urine SMALL (*)     Ketones, ur 15 (*)     Protein, ur 100 (*)     Leukocytes, UA LARGE (*)     All other components within normal limits  URINE MICROSCOPIC-ADD ON - Abnormal; Notable for the following:    Squamous Epithelial / LPF MANY (*)     Bacteria, UA MANY (*)     All other components within normal limits  PREGNANCY, URINE  LACTIC ACID, PLASMA  URINE CULTURE   Dg Chest 2 View  03/15/2012  *RADIOLOGY REPORT*  Clinical Data: Fever  CHEST - 2 VIEW  Comparison: None.  Findings: The heart and mediastinal contours are within normal limits. The lungs are clear. No airspace disease, pleural effusion, or pneumothorax is identified. The visualized bony thorax is unremarkable.  IMPRESSION: No acute cardiopulmonary disease.   Original Report Authenticated By: Britta Mccreedy, M.D.    Ct Head Wo Contrast  03/15/2012  *RADIOLOGY REPORT*  Clinical Data:  Headache for 3 days with fever and chills.  CT HEAD WITHOUT CONTRAST  Technique:  Contiguous axial images were obtained from the base of the skull through the vertex without contrast  Comparison:  Head CT 06/23/2009.  Findings:  The brain has a normal appearance without evidence for hemorrhage, acute infarction, hydrocephalus, or mass lesion.  There is no extra axial fluid collection.  The skull and  paranasal sinuses are normal. No change from priors.  IMPRESSION: Normal CT of the head without  contrast.   Original Report Authenticated By: Davonna Belling, M.D.      1. Acute pyelonephritis       MDM  Pt states her CD4 800-900 and viral load undetected. She is on no retroviral therapy and is followed by ID. Given source for pt's symptoms found, no concerning findings for CNS pathology, and lack of immunosuppression, will treat for UTI, give IVF's and have f/u with her PMD.         Loren Racer, MD 03/16/12 [phone]

## 2012-03-15 NOTE — ED Notes (Signed)
Report called to RN at [facility]

## 2012-03-15 NOTE — ED Provider Notes (Signed)
Care assumed from Dr. Ranae Palms at shift change.  The patient has what appears to be pyelonephritis and is now febrile to 104 and tachycardic to 150 bpm.  She is being hydrated and has already received rocephin.  I have spoken with Dr. Frazier Butt at [facility] who agrees to accept the patient in transfer.    Geoffery Lyons, MD 03/15/12 [phone]

## 2012-03-15 NOTE — ED Notes (Signed)
Patient states that since Friday she has had chills and headaches. Patients heart rate is elevated also.

## 2012-03-17 LAB — URINE CULTURE: Colony Count: 70000

## 2012-03-18 NOTE — ED Notes (Signed)
+   Urine Chart sent to EDP office for review. 

## 2012-03-18 NOTE — ED Notes (Signed)
Selena Batten, RN at [facility] ED requesting urine culture results from last visit 03/15/12.

## 2012-03-20 NOTE — ED Notes (Signed)
Chart returned from EDP office. Rx for Cipro 500 po BID x 14 days  Written by Fayrene Helper.

## 2012-06-10 ENCOUNTER — Other Ambulatory Visit (INDEPENDENT_AMBULATORY_CARE_PROVIDER_SITE_OTHER): Payer: Medicaid Other

## 2012-06-10 ENCOUNTER — Other Ambulatory Visit: Payer: Self-pay | Admitting: Infectious Diseases

## 2012-06-10 DIAGNOSIS — Z113 Encounter for screening for infections with a predominantly sexual mode of transmission: Secondary | ICD-10-CM

## 2012-06-10 DIAGNOSIS — B20 Human immunodeficiency virus [HIV] disease: Secondary | ICD-10-CM

## 2012-06-10 LAB — COMPLETE METABOLIC PANEL WITH GFR
ALT: 18 U/L (ref 0–35)
AST: 14 U/L (ref 0–37)
Albumin: 4.1 g/dL (ref 3.5–5.2)
Alkaline Phosphatase: 116 U/L (ref 39–117)
BUN: 10 mg/dL (ref 6–23)
CO2: 26 mEq/L (ref 19–32)
Calcium: 9.5 mg/dL (ref 8.4–10.5)
Chloride: 105 mEq/L (ref 96–112)
Creat: 0.83 mg/dL (ref 0.50–1.10)
GFR, Est African American: >89 mL/min
GFR, Est Non African American: 89 mL/min
Glucose, Bld: 100 mg/dL — ABNORMAL HIGH (ref 70–99)
Potassium: 4.1 mEq/L (ref 3.5–5.3)
Sodium: 138 mEq/L (ref 135–145)
Total Bilirubin: 0.4 mg/dL (ref 0.3–1.2)
Total Protein: 7.8 g/dL (ref 6.0–8.3)

## 2012-06-10 LAB — CBC WITH DIFFERENTIAL/PLATELET
Basophils Absolute: 0 10*3/uL (ref 0.0–0.1)
Basophils Relative: 0 % (ref 0–1)
Eosinophils Absolute: 0.1 10*3/uL (ref 0.0–0.7)
Eosinophils Relative: 1 % (ref 0–5)
HCT: 39 % (ref 36.0–46.0)
Hemoglobin: 13.1 g/dL (ref 12.0–15.0)
Lymphocytes Relative: 29 % (ref 12–46)
Lymphs Abs: 1.8 10*3/uL (ref 0.7–4.0)
MCH: 28.4 pg (ref 26.0–34.0)
MCHC: 33.6 g/dL (ref 30.0–36.0)
MCV: 84.4 fL (ref 78.0–100.0)
Monocytes Absolute: 0.7 10*3/uL (ref 0.1–1.0)
Monocytes Relative: 11 % (ref 3–12)
Neutro Abs: 3.6 10*3/uL (ref 1.7–7.7)
Neutrophils Relative %: 59 % (ref 43–77)
Platelets: 252 10*3/uL (ref 150–400)
RBC: 4.62 MIL/uL (ref 3.87–5.11)
RDW: 15.1 % (ref 11.5–15.5)
WBC: 6.2 10*3/uL (ref 4.0–10.5)

## 2012-06-10 LAB — LIPID PANEL
Cholesterol: 146 mg/dL (ref 0–200)
HDL: 32 mg/dL — ABNORMAL LOW (ref >39)
LDL Cholesterol: 90 mg/dL (ref 0–99)
Total CHOL/HDL Ratio: 4.6 Ratio
Triglycerides: 119 mg/dL (ref <150)
VLDL: 24 mg/dL (ref 0–40)

## 2012-06-10 LAB — RPR

## 2012-06-11 LAB — HIV-1 RNA QUANT-NO REFLEX-BLD
HIV 1 RNA Quant: <20 copies/mL (ref <20)
HIV-1 RNA Quant, Log: <1.3 {Log} (ref <1.30)

## 2012-06-11 LAB — T-HELPER CELL (CD4) - (RCID CLINIC ONLY)
CD4 % Helper T Cell: 39 % (ref 33–55)
CD4 T Cell Abs: 730 uL (ref 400–2700)

## 2012-06-24 ENCOUNTER — Ambulatory Visit (INDEPENDENT_AMBULATORY_CARE_PROVIDER_SITE_OTHER): Payer: Self-pay | Admitting: Infectious Disease

## 2012-06-24 ENCOUNTER — Encounter: Payer: Self-pay | Admitting: Infectious Disease

## 2012-06-24 VITALS — BP 115/77 | HR 97 | Temp 98.6°F | Ht 68.0 in | Wt 245.0 lb

## 2012-06-24 DIAGNOSIS — F3289 Other specified depressive episodes: Secondary | ICD-10-CM

## 2012-06-24 DIAGNOSIS — G43909 Migraine, unspecified, not intractable, without status migrainosus: Secondary | ICD-10-CM

## 2012-06-24 DIAGNOSIS — F32A Depression, unspecified: Secondary | ICD-10-CM

## 2012-06-24 DIAGNOSIS — B20 Human immunodeficiency virus [HIV] disease: Secondary | ICD-10-CM

## 2012-06-24 DIAGNOSIS — F329 Major depressive disorder, single episode, unspecified: Secondary | ICD-10-CM

## 2012-06-24 DIAGNOSIS — F209 Schizophrenia, unspecified: Secondary | ICD-10-CM

## 2012-06-24 MED ORDER — BUPROPION HCL ER (SR) 150 MG PO TB12: 150.0000 mg | ORAL_TABLET | Freq: Two times a day (BID) | ORAL | Status: AC

## 2012-06-24 NOTE — Addendum Note (Signed)
Addended by: Starleen Arms D on: 06/24/2012 09:26 AM   Modules accepted: Orders, Medications

## 2012-06-24 NOTE — Progress Notes (Signed)
  Subjective:    Patient ID: Adriana Fernandez, female    DOB: Mar 20, 1970, 42 y.o. y.o.   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 She was enrolled in ACTG trial for this but then could not stay in trial due to drugs she needed for her schizophrenia. I spent greater than 45 minutes with the patient including greater than 50% of time in face to face counsel of the patient and in coordination of their care.  She is otherwise doing well. I would not rx ARV yet to her without more information from clinical trials   Review of Systems  Constitutional: Negative for fever, chills, diaphoresis, activity change, appetite change, fatigue and unexpected weight change.  HENT: Negative for congestion, sore throat, rhinorrhea, sneezing, trouble swallowing and sinus pressure.   Eyes: Negative for photophobia and visual disturbance.  Respiratory: Negative for cough, chest tightness, shortness of breath, wheezing and stridor.   Cardiovascular: Negative for chest pain, palpitations and leg swelling.  Gastrointestinal: Negative for nausea, vomiting, abdominal pain, diarrhea, constipation, blood in stool, abdominal distention and anal bleeding.  Genitourinary: Negative for dysuria, hematuria, flank pain and difficulty urinating.  Musculoskeletal: Negative for myalgias, back pain, joint swelling, arthralgias and gait problem.  Skin: Negative for color change, pallor, rash and wound.  Neurological: Negative for dizziness, tremors, weakness and light-headedness.  Hematological: Negative for adenopathy. Does not bruise/bleed easily.  Psychiatric/Behavioral: Negative for behavioral problems, confusion, sleep disturbance, dysphoric mood, decreased concentration and agitation.       Objective:   Physical Exam  Constitutional: She is oriented to person, place, and time. She appears well-developed and well-nourished. No  distress.  HENT:  Head: Normocephalic and atraumatic.  Mouth/Throat: Oropharynx is clear and moist. No oropharyngeal exudate.  Eyes: Conjunctivae and EOM are normal. Pupils are equal, round, and reactive to light. No scleral icterus.  Neck: Normal range of motion. Neck supple. No JVD present.  Cardiovascular: Normal rate, regular rhythm and normal heart sounds.  Exam reveals no gallop and no friction rub.   No murmur heard. Pulmonary/Chest: Effort normal and breath sounds normal. No respiratory distress. She has no wheezes. She has no rales. She exhibits no tenderness.  Abdominal: She exhibits no distension and no mass. There is no tenderness. There is no rebound and no guarding.  Musculoskeletal: She exhibits no edema and no tenderness.  Lymphadenopathy:    She has no cervical adenopathy.  Neurological: She is alert and oriented to person, place, and time. She has normal reflexes. She exhibits normal muscle tone. Coordination normal.  Skin: Skin is warm and dry. She is not diaphoretic. No erythema. No pallor.  Psychiatric: She has a normal mood and affect. Judgment and thought content normal.          Assessment & Plan:  HIV: elite controller. There is evidence that pts who are elite controllers may have more systemic inflammation thought to be due to ongoing suppression of HIV virus by immune system. ? Intervention for such pts ? ARV anti=inflammatory drugs, statins might be helpful is not yet known  Schizopherenia; well controlled  MIgraines: going to Neurologist  HCM vaccines up to date

## 2012-11-09 ENCOUNTER — Ambulatory Visit (INDEPENDENT_AMBULATORY_CARE_PROVIDER_SITE_OTHER): Payer: Medicaid Other | Admitting: *Deleted

## 2012-11-09 ENCOUNTER — Other Ambulatory Visit: Payer: Medicaid Other

## 2012-11-09 DIAGNOSIS — Z23 Encounter for immunization: Secondary | ICD-10-CM

## 2012-11-09 DIAGNOSIS — B20 Human immunodeficiency virus [HIV] disease: Secondary | ICD-10-CM

## 2012-11-09 DIAGNOSIS — Z1231 Encounter for screening mammogram for malignant neoplasm of breast: Secondary | ICD-10-CM

## 2012-11-09 DIAGNOSIS — Z124 Encounter for screening for malignant neoplasm of cervix: Secondary | ICD-10-CM

## 2012-11-09 DIAGNOSIS — R350 Frequency of micturition: Secondary | ICD-10-CM

## 2012-11-09 LAB — CBC WITH DIFFERENTIAL/PLATELET
Basophils Absolute: 0 10*3/uL (ref 0.0–0.1)
Basophils Relative: 0 % (ref 0–1)
Eosinophils Absolute: 0.1 10*3/uL (ref 0.0–0.7)
Eosinophils Relative: 1 % (ref 0–5)
HCT: 36.7 % (ref 36.0–46.0)
Hemoglobin: 12.4 g/dL (ref 12.0–15.0)
Lymphocytes Relative: 27 % (ref 12–46)
Lymphs Abs: 1.8 10*3/uL (ref 0.7–4.0)
MCH: 27.9 pg (ref 26.0–34.0)
MCHC: 33.8 g/dL (ref 30.0–36.0)
MCV: 82.5 fL (ref 78.0–100.0)
Monocytes Absolute: 0.8 10*3/uL (ref 0.1–1.0)
Monocytes Relative: 12 % (ref 3–12)
Neutro Abs: 4 10*3/uL (ref 1.7–7.7)
Neutrophils Relative %: 60 % (ref 43–77)
Platelets: 260 10*3/uL (ref 150–400)
RBC: 4.45 MIL/uL (ref 3.87–5.11)
RDW: 14.5 % (ref 11.5–15.5)
WBC: 6.6 10*3/uL (ref 4.0–10.5)

## 2012-11-09 LAB — COMPLETE METABOLIC PANEL WITH GFR
ALT: 16 U/L (ref 0–35)
AST: 14 U/L (ref 0–37)
Albumin: 4.2 g/dL (ref 3.5–5.2)
Alkaline Phosphatase: 125 U/L — ABNORMAL HIGH (ref 39–117)
BUN: 8 mg/dL (ref 6–23)
CO2: 28 mEq/L (ref 19–32)
Calcium: 9.6 mg/dL (ref 8.4–10.5)
Chloride: 105 mEq/L (ref 96–112)
Creat: 0.83 mg/dL (ref 0.50–1.10)
GFR, Est African American: >89 mL/min
GFR, Est Non African American: 88 mL/min
Glucose, Bld: 78 mg/dL (ref 70–99)
Potassium: 4.1 mEq/L (ref 3.5–5.3)
Sodium: 137 mEq/L (ref 135–145)
Total Bilirubin: 0.3 mg/dL (ref 0.3–1.2)
Total Protein: 7.5 g/dL (ref 6.0–8.3)

## 2012-11-09 LAB — LIPID PANEL
Cholesterol: 121 mg/dL (ref 0–200)
HDL: 28 mg/dL — ABNORMAL LOW (ref >39)
LDL Cholesterol: 75 mg/dL (ref 0–99)
Total CHOL/HDL Ratio: 4.3 Ratio
Triglycerides: 92 mg/dL (ref <150)
VLDL: 18 mg/dL (ref 0–40)

## 2012-11-09 LAB — RPR

## 2012-11-09 NOTE — Progress Notes (Signed)
  Subjective:     Adriana Fernandez is a 42 y.o. woman who comes in today for a  pap smear only.  Contraception: Mirena, condoms.   Urinary frequency x 4 days per patient.  Objective:    There were no vitals taken for this visit.  Brownish vaginal discharge. Pelvic Exam:  Pap smear obtained.   Assessment:    Screening pap smear.   Plan:    Follow up in one year, or as indicated by Pap results.  Pt given educational materials re: HIV and women, self-esteem, nutrition and diet management, PAP smears and partner safety. Pt given condoms.

## 2012-11-09 NOTE — Patient Instructions (Signed)
  Your results will be ready in about a week.  You may look them up on MyChart.  Thank you for coming to the Center for your care.  Lazara Grieser, RN 

## 2012-11-10 LAB — URINALYSIS, ROUTINE W REFLEX MICROSCOPIC
Bilirubin Urine: NEGATIVE
Glucose, UA: NEGATIVE mg/dL
Ketones, ur: NEGATIVE mg/dL
Nitrite: POSITIVE — AB
Protein, ur: NEGATIVE mg/dL
Specific Gravity, Urine: 1.012 (ref 1.005–1.030)
Urobilinogen, UA: 1 mg/dL (ref 0.0–1.0)
pH: 6 (ref 5.0–8.0)

## 2012-11-10 LAB — T-HELPER CELL (CD4) - (RCID CLINIC ONLY)
CD4 % Helper T Cell: 44 % (ref 33–55)
CD4 T Cell Abs: 830 /uL (ref 400–2700)

## 2012-11-10 LAB — HIV-1 RNA QUANT-NO REFLEX-BLD
HIV 1 RNA Quant: <20 copies/mL (ref <20)
HIV-1 RNA Quant, Log: <1.3 {Log} (ref <1.30)

## 2012-11-10 LAB — URINALYSIS, MICROSCOPIC ONLY
Casts: NONE SEEN
Crystals: NONE SEEN

## 2012-11-12 ENCOUNTER — Encounter: Payer: Self-pay | Admitting: *Deleted

## 2012-11-17 NOTE — Addendum Note (Signed)
Addended by: Jennet Maduro D on: 11/17/2012 03:39 PM   Modules accepted: Orders

## 2012-11-24 ENCOUNTER — Ambulatory Visit (HOSPITAL_COMMUNITY): Payer: Self-pay

## 2012-12-03 ENCOUNTER — Ambulatory Visit
Admission: RE | Admit: 2012-12-03 | Discharge: 2012-12-03 | Disposition: A | Discharge status: Home / Self Care | Payer: Self-pay | Source: Ambulatory Visit | Attending: Infectious Disease | Admitting: Infectious Disease

## 2012-12-03 DIAGNOSIS — Z1231 Encounter for screening mammogram for malignant neoplasm of breast: Secondary | ICD-10-CM

## 2012-12-16 ENCOUNTER — Other Ambulatory Visit: Payer: Self-pay

## 2012-12-30 ENCOUNTER — Encounter: Payer: Self-pay | Admitting: Infectious Disease

## 2012-12-30 ENCOUNTER — Ambulatory Visit (INDEPENDENT_AMBULATORY_CARE_PROVIDER_SITE_OTHER): Payer: Medicaid Other | Admitting: Infectious Disease

## 2012-12-30 VITALS — BP 115/77 | HR 80 | Temp 97.9°F | Ht 68.0 in | Wt 236.0 lb

## 2012-12-30 DIAGNOSIS — Z23 Encounter for immunization: Secondary | ICD-10-CM

## 2012-12-30 DIAGNOSIS — F200 Paranoid schizophrenia: Secondary | ICD-10-CM | POA: Insufficient documentation

## 2012-12-30 DIAGNOSIS — D573 Sickle-cell trait: Secondary | ICD-10-CM | POA: Insufficient documentation

## 2012-12-30 DIAGNOSIS — B20 Human immunodeficiency virus [HIV] disease: Secondary | ICD-10-CM

## 2012-12-30 NOTE — Progress Notes (Signed)
  Subjective:    Patient ID: Adriana Fernandez, female    DOB: 02-01-1971, 42 y.o.   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 She was enrolled in ACTG trial for this but then could not stay in trial due to drugs she needed for her schizophrenia..  She is otherwise doing well. I would not rx ARV yet to her without more information from clinical trials   Review of Systems  Constitutional: Negative for fever, chills, diaphoresis, activity change, appetite change, fatigue and unexpected weight change.  HENT: Negative for congestion, rhinorrhea, sinus pressure, sneezing, sore throat and trouble swallowing.   Eyes: Negative for photophobia and visual disturbance.  Respiratory: Negative for cough, chest tightness, shortness of breath, wheezing and stridor.   Cardiovascular: Negative for chest pain, palpitations and leg swelling.  Gastrointestinal: Negative for nausea, vomiting, abdominal pain, diarrhea, constipation, blood in stool, abdominal distention and anal bleeding.  Genitourinary: Negative for dysuria, hematuria, flank pain and difficulty urinating.  Musculoskeletal: Negative for arthralgias, back pain, gait problem, joint swelling and myalgias.  Skin: Negative for color change, pallor, rash and wound.  Neurological: Negative for dizziness, tremors, weakness and light-headedness.  Hematological: Negative for adenopathy. Does not bruise/bleed easily.  Psychiatric/Behavioral: Negative for behavioral problems, confusion, sleep disturbance, dysphoric mood, decreased concentration and agitation.       Objective:   Physical Exam  Constitutional: She is oriented to person, place, and time. She appears well-developed and well-nourished. No distress.  HENT:  Head: Normocephalic and atraumatic.  Mouth/Throat: Oropharynx is clear and moist. No oropharyngeal exudate.  Eyes: Conjunctivae and  EOM are normal. Pupils are equal, round, and reactive to light. No scleral icterus.  Neck: Normal range of motion. Neck supple. No JVD present.  Cardiovascular: Normal rate, regular rhythm and normal heart sounds.  Exam reveals no gallop and no friction rub.   No murmur heard. Pulmonary/Chest: Effort normal and breath sounds normal. No respiratory distress. She has no wheezes. She has no rales. She exhibits no tenderness.  Abdominal: She exhibits no distension and no mass. There is no tenderness. There is no rebound and no guarding.  Musculoskeletal: She exhibits no edema and no tenderness.  Lymphadenopathy:    She has no cervical adenopathy.  Neurological: She is alert and oriented to person, place, and time. She has normal reflexes. She exhibits normal muscle tone. Coordination normal.  Skin: Skin is warm and dry. She is not diaphoretic. No erythema. No pallor.  Psychiatric: She has a normal mood and affect. Judgment and thought content normal.          Assessment & Plan:  HIV: elite controller. There is evidence that pts who are elite controllers may have more systemic inflammation thought to be due to ongoing suppression of HIV virus by immune system. ? Intervention for such pts ? ARV anti=inflammatory drugs, statins might be helpful is not yet known. Her LDL is normal. She is a nonsmoker and without HTN and without high risk family hx for CAD. I would not add more drugs could consider measuring HR CRP but doubt medicaid would cover this at this point  Schizopherenia; well controlled  MIgraines: going to Neurologist  Need for flu vaccine: given

## 2013-06-29 ENCOUNTER — Other Ambulatory Visit: Payer: Self-pay

## 2013-07-07 ENCOUNTER — Other Ambulatory Visit: Payer: Medicaid Other

## 2013-07-14 ENCOUNTER — Ambulatory Visit: Payer: Medicaid Other | Admitting: Infectious Disease

## 2013-07-14 ENCOUNTER — Ambulatory Visit: Payer: Self-pay | Admitting: Infectious Disease

## 2013-08-19 ENCOUNTER — Encounter: Payer: Self-pay | Admitting: Internal Medicine

## 2013-08-19 ENCOUNTER — Ambulatory Visit (INDEPENDENT_AMBULATORY_CARE_PROVIDER_SITE_OTHER): Payer: Medicaid Other | Admitting: Internal Medicine

## 2013-08-19 VITALS — BP 108/74 | HR 84 | Temp 98.0°F | Ht 68.0 in | Wt 237.0 lb

## 2013-08-19 DIAGNOSIS — Z113 Encounter for screening for infections with a predominantly sexual mode of transmission: Secondary | ICD-10-CM

## 2013-08-19 DIAGNOSIS — Z79899 Other long term (current) drug therapy: Secondary | ICD-10-CM

## 2013-08-19 DIAGNOSIS — B20 Human immunodeficiency virus [HIV] disease: Secondary | ICD-10-CM

## 2013-08-19 LAB — LIPID PANEL
Cholesterol: 134 mg/dL (ref 0–200)
HDL: 28 mg/dL — ABNORMAL LOW (ref >39)
LDL Cholesterol: 84 mg/dL (ref 0–99)
Total CHOL/HDL Ratio: 4.8 Ratio
Triglycerides: 109 mg/dL (ref <150)
VLDL: 22 mg/dL (ref 0–40)

## 2013-08-19 LAB — CBC WITH DIFFERENTIAL/PLATELET
Basophils Absolute: 0 10*3/uL (ref 0.0–0.1)
Basophils Relative: 0 % (ref 0–1)
Eosinophils Absolute: 0.1 10*3/uL (ref 0.0–0.7)
Eosinophils Relative: 1 % (ref 0–5)
HCT: 37.3 % (ref 36.0–46.0)
Hemoglobin: 12.4 g/dL (ref 12.0–15.0)
Lymphocytes Relative: 30 % (ref 12–46)
Lymphs Abs: 1.8 10*3/uL (ref 0.7–4.0)
MCH: 27.7 pg (ref 26.0–34.0)
MCHC: 33.2 g/dL (ref 30.0–36.0)
MCV: 83.3 fL (ref 78.0–100.0)
Monocytes Absolute: 0.6 10*3/uL (ref 0.1–1.0)
Monocytes Relative: 10 % (ref 3–12)
Neutro Abs: 3.5 10*3/uL (ref 1.7–7.7)
Neutrophils Relative %: 59 % (ref 43–77)
Platelets: 243 10*3/uL (ref 150–400)
RBC: 4.48 MIL/uL (ref 3.87–5.11)
RDW: 14.3 % (ref 11.5–15.5)
WBC: 5.9 10*3/uL (ref 4.0–10.5)

## 2013-08-19 LAB — COMPLETE METABOLIC PANEL WITH GFR
ALT: 18 U/L (ref 0–35)
AST: 15 U/L (ref 0–37)
Albumin: 3.8 g/dL (ref 3.5–5.2)
Alkaline Phosphatase: 97 U/L (ref 39–117)
BUN: 13 mg/dL (ref 6–23)
CO2: 25 mEq/L (ref 19–32)
Calcium: 8.7 mg/dL (ref 8.4–10.5)
Chloride: 107 mEq/L (ref 96–112)
Creat: 0.81 mg/dL (ref 0.50–1.10)
GFR, Est African American: >89 mL/min
GFR, Est Non African American: >89 mL/min
Glucose, Bld: 101 mg/dL — ABNORMAL HIGH (ref 70–99)
Potassium: 3.8 mEq/L (ref 3.5–5.3)
Sodium: 138 mEq/L (ref 135–145)
Total Bilirubin: 0.2 mg/dL (ref 0.2–1.2)
Total Protein: 7.2 g/dL (ref 6.0–8.3)

## 2013-08-19 NOTE — Assessment & Plan Note (Signed)
Elite controller, no indication for treatment at this time pending further data regarding treatment in these patients.  She will return in 1 year, labs today and she is on Mychart.

## 2013-08-19 NOTE — Progress Notes (Signed)
   Subjective:    Patient ID: Lynetta MareUlaunda K Suniga, female    DOB: 1971/02/09, 43 y.o.   MRN: [id number]  HPI  Here for follow up of HIV.  Is and elite controller and has had no indication for treatment.  Tells me she is followed yearly now.  Has always been undetectable.  No new issues.    Review of Systems  Constitutional: Negative for fatigue.  Gastrointestinal: Positive for diarrhea.  Skin: Negative for rash.  Neurological: Negative for dizziness and light-headedness.  Psychiatric/Behavioral: Negative for sleep disturbance.       Objective:   Physical Exam  Constitutional: She appears well-developed and well-nourished. No distress.  HENT:  Mouth/Throat: No oropharyngeal exudate.  Eyes: Right eye exhibits no discharge. Left eye exhibits no discharge. No scleral icterus.  Cardiovascular: Normal rate, regular rhythm and normal heart sounds.   No murmur heard. Pulmonary/Chest: Effort normal. No respiratory distress. She has no wheezes.  Lymphadenopathy:    She has no cervical adenopathy.  Skin: Skin is warm and dry. No rash noted.          Assessment & Plan:

## 2013-08-20 LAB — T-HELPER CELL (CD4) - (RCID CLINIC ONLY)
CD4 % Helper T Cell: 46 % (ref 33–55)
CD4 T Cell Abs: 730 /uL (ref 400–2700)

## 2013-08-20 LAB — HIV-1 RNA QUANT-NO REFLEX-BLD
HIV 1 RNA Quant: <20 copies/mL (ref <20)
HIV-1 RNA Quant, Log: <1.3 {Log} (ref <1.30)

## 2013-08-20 LAB — RPR

## 2013-09-16 ENCOUNTER — Ambulatory Visit: Payer: Medicaid Other | Admitting: Internal Medicine

## 2013-12-01 ENCOUNTER — Ambulatory Visit (INDEPENDENT_AMBULATORY_CARE_PROVIDER_SITE_OTHER): Payer: BLUE CROSS/BLUE SHIELD | Admitting: *Deleted

## 2013-12-01 DIAGNOSIS — Z113 Encounter for screening for infections with a predominantly sexual mode of transmission: Secondary | ICD-10-CM | POA: Diagnosis not present

## 2013-12-01 DIAGNOSIS — Z124 Encounter for screening for malignant neoplasm of cervix: Secondary | ICD-10-CM

## 2013-12-01 DIAGNOSIS — Z23 Encounter for immunization: Secondary | ICD-10-CM | POA: Diagnosis not present

## 2013-12-01 NOTE — Patient Instructions (Signed)
Your PAP smear results will be ready in about a week.  I will mail them to you.  If there is an infection I will call you.  Thank you for coming to the Center for your care.  Angelique Blonderenise, RN

## 2013-12-01 NOTE — Progress Notes (Signed)
  Subjective:     Adriana Fernandez is a 43 y.o. woman who comes in today for a  pap smear only.  Previous abnormal Pap smears: yes. Contraception: Mirena.  Objective:    There were no vitals taken for this visit. Pelvic Exam: Pap smear obtained.   Assessment:    Screening pap smear.   Plan:    Follow up in one year, or as indicated by Pap results.

## 2013-12-02 LAB — CYTOLOGY - PAP

## 2013-12-03 ENCOUNTER — Telehealth: Payer: Self-pay | Admitting: *Deleted

## 2013-12-03 ENCOUNTER — Other Ambulatory Visit: Payer: Self-pay | Admitting: *Deleted

## 2013-12-03 DIAGNOSIS — A599 Trichomoniasis, unspecified: Secondary | ICD-10-CM

## 2013-12-03 MED ORDER — METRONIDAZOLE 500 MG PO TABS: 2000.0000 mg | ORAL_TABLET | Freq: Once | ORAL | Status: DC

## 2013-12-03 MED ORDER — FLUCONAZOLE 100 MG PO TABS: 100.0000 mg | ORAL_TABLET | Freq: Every day | ORAL | Status: DC

## 2013-12-03 NOTE — Telephone Encounter (Signed)
Message copied by Macy MisOCKERHAM, Mikel Hardgrove A on Fri Dec 03, 2013  4:54 PM ------      Message from: Daiva EvesVAN DAM, CORNELIUS N      Created: Fri Dec 03, 2013 11:50 AM       Can we give this lady 2 grams of metronidazole and fluconazole 100mg  daily for 10 days. Partners should be tested and treated for trich as well ------

## 2013-12-03 NOTE — Telephone Encounter (Signed)
Left patient a voice mail that Rx has been called to her pharmacy to treat her infection and to please inform her partner to be treated as well. Adriana Fernandez

## 2013-12-06 ENCOUNTER — Encounter: Payer: Self-pay | Admitting: *Deleted

## 2013-12-20 ENCOUNTER — Other Ambulatory Visit: Payer: Self-pay | Admitting: Internal Medicine

## 2013-12-20 ENCOUNTER — Other Ambulatory Visit: Payer: Self-pay | Admitting: Licensed Clinical Social Worker

## 2013-12-20 ENCOUNTER — Telehealth: Payer: Self-pay | Admitting: Licensed Clinical Social Worker

## 2013-12-20 DIAGNOSIS — A599 Trichomoniasis, unspecified: Secondary | ICD-10-CM

## 2013-12-20 MED ORDER — METRONIDAZOLE 500 MG PO TABS: 2000.0000 mg | ORAL_TABLET | Freq: Once | ORAL | Status: DC

## 2013-12-20 NOTE — Telephone Encounter (Signed)
Patient was treated for Trichomonas and Yeast earlier this month, patient states that was not informed that she had Trichomonas just an infection because it was left on voicemail. She had unprotected sex with her Fiance and was reinfected. Can we call in another prescription for Flagyl? Please advise.

## 2013-12-20 NOTE — Telephone Encounter (Signed)
She has symptoms to suggest recurrent vaginitis after recent therapy with metronidazole and fluconazole for trichomonas and Candida. She had unprotected sex with her fianc after treatment. He has not been treated for trichomonas yet. He needs treatment. I will retreat her with metronidazole.

## 2013-12-27 NOTE — Telephone Encounter (Signed)
RN left message to be sure that her current sexual partner go for evaluation and treatment for Trichomonas.  Pt and partner need to use protected sexual encounters until both partners are treated and afterward to prevent future infections.  RX has been sent to [facility] on [address] [facility] [place] on 12/20/13.

## 2014-07-26 ENCOUNTER — Encounter: Payer: Self-pay | Admitting: Infectious Disease

## 2014-07-26 ENCOUNTER — Ambulatory Visit (INDEPENDENT_AMBULATORY_CARE_PROVIDER_SITE_OTHER): Payer: BLUE CROSS/BLUE SHIELD | Admitting: Infectious Disease

## 2014-07-26 VITALS — BP 129/87 | HR 105 | Temp 98.1°F | Ht 66.5 in | Wt 258.0 lb

## 2014-07-26 DIAGNOSIS — B009 Herpesviral infection, unspecified: Secondary | ICD-10-CM

## 2014-07-26 DIAGNOSIS — F200 Paranoid schizophrenia: Secondary | ICD-10-CM | POA: Diagnosis not present

## 2014-07-26 DIAGNOSIS — B20 Human immunodeficiency virus [HIV] disease: Secondary | ICD-10-CM | POA: Diagnosis not present

## 2014-07-26 HISTORY — DX: Herpesviral infection, unspecified: B00.9

## 2014-07-26 LAB — COMPLETE METABOLIC PANEL WITH GFR
ALT: 14 U/L (ref 0–35)
AST: 11 U/L (ref 0–37)
Albumin: 3.8 g/dL (ref 3.5–5.2)
Alkaline Phosphatase: 104 U/L (ref 39–117)
BUN: 9 mg/dL (ref 6–23)
CO2: 22 mEq/L (ref 19–32)
Calcium: 9 mg/dL (ref 8.4–10.5)
Chloride: 106 mEq/L (ref 96–112)
Creat: 0.71 mg/dL (ref 0.50–1.10)
GFR, Est African American: >89 mL/min
GFR, Est Non African American: >89 mL/min
Glucose, Bld: 87 mg/dL (ref 70–99)
Potassium: 3.9 mEq/L (ref 3.5–5.3)
Sodium: 138 mEq/L (ref 135–145)
Total Bilirubin: 0.2 mg/dL (ref 0.2–1.2)
Total Protein: 7.1 g/dL (ref 6.0–8.3)

## 2014-07-26 LAB — CBC WITH DIFFERENTIAL/PLATELET
Basophils Absolute: 0 10*3/uL (ref 0.0–0.1)
Basophils Relative: 0 % (ref 0–1)
Eosinophils Absolute: 0.1 10*3/uL (ref 0.0–0.7)
Eosinophils Relative: 1 % (ref 0–5)
HCT: 36.7 % (ref 36.0–46.0)
Hemoglobin: 12.2 g/dL (ref 12.0–15.0)
Lymphocytes Relative: 31 % (ref 12–46)
Lymphs Abs: 2.5 10*3/uL (ref 0.7–4.0)
MCH: 28.2 pg (ref 26.0–34.0)
MCHC: 33.2 g/dL (ref 30.0–36.0)
MCV: 85 fL (ref 78.0–100.0)
MPV: 10.2 fL (ref 8.6–12.4)
Monocytes Absolute: 0.8 10*3/uL (ref 0.1–1.0)
Monocytes Relative: 10 % (ref 3–12)
Neutro Abs: 4.8 10*3/uL (ref 1.7–7.7)
Neutrophils Relative %: 58 % (ref 43–77)
Platelets: 269 10*3/uL (ref 150–400)
RBC: 4.32 MIL/uL (ref 3.87–5.11)
RDW: 14.7 % (ref 11.5–15.5)
WBC: 8.2 10*3/uL (ref 4.0–10.5)

## 2014-07-26 LAB — LDL CHOLESTEROL, DIRECT: Direct LDL: 85 mg/dL

## 2014-07-26 NOTE — Progress Notes (Signed)
Subjective:    Patient ID: Adriana Fernandez, female    DOB: 08-May-1970, 44 y.o.   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   She was enrolled in ACTG trial for this but then could not stay in trial due to drugs she needed for her schizophrenia..  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 She is now on valtrex 1 gram daily.  Her schizophrenia is well controlled currently and she is back at work now at [employer].   Review of Systems  Constitutional: Negative for fever, chills, diaphoresis, activity change, appetite change, fatigue and unexpected weight change.  HENT: Negative for congestion, rhinorrhea, sinus pressure, sneezing, sore throat and trouble swallowing.   Eyes: Negative for photophobia and visual disturbance.  Respiratory: Negative for cough, chest tightness, shortness of breath, wheezing and stridor.   Cardiovascular: Negative for chest pain, palpitations and leg swelling.  Gastrointestinal: Negative for nausea, vomiting, abdominal pain, diarrhea, constipation, blood in stool, abdominal distention and anal bleeding.  Genitourinary: Negative for dysuria, hematuria, flank pain and difficulty urinating.  Musculoskeletal: Negative for myalgias, back pain, joint swelling, arthralgias and gait problem.  Skin: Negative for color change, pallor, rash and wound.  Neurological: Negative for dizziness, tremors, weakness and light-headedness.  Hematological: Negative for adenopathy. Does not bruise/bleed easily.  Psychiatric/Behavioral: Negative for behavioral problems, confusion, sleep disturbance, dysphoric mood, decreased concentration and agitation.       Objective:   Physical Exam  Constitutional: She is oriented to person, place, and time.  She appears well-developed and well-nourished. No distress.  HENT:  Head: Normocephalic and atraumatic.  Mouth/Throat: Oropharynx is clear and moist. No oropharyngeal exudate.  Eyes: Conjunctivae and EOM are normal. Pupils are equal, round, and reactive to light. No scleral icterus.  Neck: Normal range of motion. Neck supple. No JVD present.  Cardiovascular: Normal rate, regular rhythm and normal heart sounds.  Exam reveals no gallop and no friction rub.   No murmur heard. Pulmonary/Chest: Effort normal and breath sounds normal. No respiratory distress. She has no wheezes. She has no rales. She exhibits no tenderness.  Abdominal: She exhibits no distension and no mass. There is no tenderness. There is no rebound and no guarding.  Musculoskeletal: She exhibits no edema or tenderness.  Lymphadenopathy:    She has no cervical adenopathy.  Neurological: She is alert and oriented to person, place, and time. She has normal reflexes. She exhibits normal muscle tone. Coordination normal.  Skin: Skin is warm and dry. She is not diaphoretic. No erythema. No pallor.  Psychiatric: She has a normal mood and affect. Judgment and thought content normal.          Assessment & Plan:  HIV: elite controller. There is evidence that pts who are elite controllers may have more systemic inflammation thought to be due to ongoing suppression of HIV virus by immune system. ? Intervention for such pts ? ARV anti=inflammatory drugs, statins might be helpful is not yet known. Her LDL is normal. She is a nonsmoker and without HTN and without high risk family hx for CAD. Losing weight may be best intervention. I will check her LDL direct today as well and see if she is candidate for statin by guidelines. I  spent greater than 25 minutes with the patient including greater than 50% of time in face to face counsel of the patient re her HIV and HSV1 and 2 and also re nature of "elite controllers"  and in coordination of their  care.  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

## 2014-07-27 LAB — RPR

## 2014-07-28 LAB — T-HELPER CELL (CD4) - (RCID CLINIC ONLY)
CD4 % Helper T Cell: 47 % (ref 33–55)
CD4 T Cell Abs: 1130 /uL (ref 400–2700)

## 2014-07-29 LAB — URINE CYTOLOGY ANCILLARY ONLY
Chlamydia: NEGATIVE
Neisseria Gonorrhea: NEGATIVE

## 2014-08-05 LAB — HIV-1 INTEGRASE GENOTYPE

## 2014-08-06 ENCOUNTER — Encounter: Payer: Self-pay | Admitting: Infectious Disease

## 2014-08-17 ENCOUNTER — Telehealth: Payer: Self-pay | Admitting: Infectious Disease

## 2014-09-06 ENCOUNTER — Other Ambulatory Visit: Payer: Self-pay | Admitting: *Deleted

## 2014-09-06 DIAGNOSIS — B20 Human immunodeficiency virus [HIV] disease: Secondary | ICD-10-CM

## 2014-09-06 MED ORDER — ELVITEG-COBIC-EMTRICIT-TENOFDF 150-150-200-300 MG PO TABS: 1.0000 | ORAL_TABLET | Freq: Every day | ORAL | Status: DC

## 2014-09-14 ENCOUNTER — Other Ambulatory Visit: Payer: Self-pay | Admitting: *Deleted

## 2014-09-14 ENCOUNTER — Other Ambulatory Visit (INDEPENDENT_AMBULATORY_CARE_PROVIDER_SITE_OTHER): Payer: Self-pay

## 2014-09-14 DIAGNOSIS — B20 Human immunodeficiency virus [HIV] disease: Secondary | ICD-10-CM

## 2014-09-14 NOTE — Progress Notes (Signed)
Pt needs viral load for ADAP/RW application. Andree CossHowell, Chun Sellen M, RN

## 2014-09-15 LAB — HIV-1 RNA QUANT-NO REFLEX-BLD
HIV 1 RNA Quant: <20 copies/mL (ref <20)
HIV-1 RNA Quant, Log: <1.3 {Log} (ref <1.30)

## 2014-12-05 NOTE — Progress Notes (Signed)
Notice of approval faxed to [facility]. Latisa Belay M, RN  

## 2014-12-16 ENCOUNTER — Encounter: Payer: Self-pay | Admitting: *Deleted

## 2014-12-30 ENCOUNTER — Ambulatory Visit (INDEPENDENT_AMBULATORY_CARE_PROVIDER_SITE_OTHER): Payer: Self-pay | Admitting: *Deleted

## 2014-12-30 DIAGNOSIS — Z1231 Encounter for screening mammogram for malignant neoplasm of breast: Secondary | ICD-10-CM

## 2014-12-30 DIAGNOSIS — Z124 Encounter for screening for malignant neoplasm of cervix: Secondary | ICD-10-CM

## 2014-12-30 DIAGNOSIS — Z113 Encounter for screening for infections with a predominantly sexual mode of transmission: Secondary | ICD-10-CM

## 2014-12-30 DIAGNOSIS — Z23 Encounter for immunization: Secondary | ICD-10-CM

## 2014-12-30 NOTE — Progress Notes (Signed)
  Subjective:     Adriana Fernandez is a 44 y.o. woman who comes in today for a  pap smear only.  Previous abnormal Pap smears: no. Contraception: Mirena.  HX of BV, currently being [facility], metronidazole on Monday and Thursday for suppressive therapy.  Has been told that she will need to find another source of treatment.  RN advised the pt to update her "[employer]."  Given information about updating this card.    Objective:    There were no vitals taken for this visit. Pelvic Exam:  Pap smear obtained.   Assessment:    Screening pap smear.   Plan:   Follow up in one year, or as indicated by Pap results.  Pt given educational materials re: HIV and women, self-esteem, BSE, nutrition and diet management, PAP smears and partner safety. Pt given condoms

## 2015-01-02 LAB — CERVICOVAGINAL ANCILLARY ONLY
Chlamydia: NEGATIVE
Neisseria Gonorrhea: NEGATIVE
Trichomonas: NEGATIVE

## 2015-01-02 LAB — CYTOLOGY - PAP

## 2015-01-06 NOTE — Progress Notes (Signed)
Thanks Denise! 

## 2015-01-18 ENCOUNTER — Ambulatory Visit: Payer: Self-pay

## 2015-03-14 ENCOUNTER — Telehealth: Payer: Self-pay

## 2015-03-14 NOTE — Telephone Encounter (Signed)
Paperwork mailed to pt

## 2015-03-17 ENCOUNTER — Encounter (HOSPITAL_BASED_OUTPATIENT_CLINIC_OR_DEPARTMENT_OTHER): Payer: Self-pay

## 2015-03-17 ENCOUNTER — Emergency Department (HOSPITAL_BASED_OUTPATIENT_CLINIC_OR_DEPARTMENT_OTHER): Payer: Self-pay

## 2015-03-17 ENCOUNTER — Emergency Department (HOSPITAL_BASED_OUTPATIENT_CLINIC_OR_DEPARTMENT_OTHER)
Admission: EM | Admit: 2015-03-17 | Discharge: 2015-03-17 | Disposition: A | Discharge status: Home / Self Care | Payer: Self-pay | Attending: Emergency Medicine | Admitting: Emergency Medicine

## 2015-03-17 DIAGNOSIS — F200 Paranoid schizophrenia: Secondary | ICD-10-CM | POA: Insufficient documentation

## 2015-03-17 DIAGNOSIS — B20 Human immunodeficiency virus [HIV] disease: Secondary | ICD-10-CM | POA: Insufficient documentation

## 2015-03-17 DIAGNOSIS — I10 Essential (primary) hypertension: Secondary | ICD-10-CM | POA: Insufficient documentation

## 2015-03-17 DIAGNOSIS — R079 Chest pain, unspecified: Secondary | ICD-10-CM | POA: Insufficient documentation

## 2015-03-17 DIAGNOSIS — Z8619 Personal history of other infectious and parasitic diseases: Secondary | ICD-10-CM | POA: Insufficient documentation

## 2015-03-17 DIAGNOSIS — Z91040 Latex allergy status: Secondary | ICD-10-CM | POA: Insufficient documentation

## 2015-03-17 DIAGNOSIS — Z862 Personal history of diseases of the blood and blood-forming organs and certain disorders involving the immune mechanism: Secondary | ICD-10-CM | POA: Insufficient documentation

## 2015-03-17 DIAGNOSIS — F329 Major depressive disorder, single episode, unspecified: Secondary | ICD-10-CM | POA: Insufficient documentation

## 2015-03-17 DIAGNOSIS — Z79899 Other long term (current) drug therapy: Secondary | ICD-10-CM | POA: Insufficient documentation

## 2015-03-17 HISTORY — DX: Essential (primary) hypertension: I10

## 2015-03-17 LAB — CBC
HCT: 38.3 % (ref 36.0–46.0)
Hemoglobin: 12.9 g/dL (ref 12.0–15.0)
MCH: 27.9 pg (ref 26.0–34.0)
MCHC: 33.7 g/dL (ref 30.0–36.0)
MCV: 82.9 fL (ref 78.0–100.0)
Platelets: 291 10*3/uL (ref 150–400)
RBC: 4.62 MIL/uL (ref 3.87–5.11)
RDW: 14.4 % (ref 11.5–15.5)
WBC: 7.3 10*3/uL (ref 4.0–10.5)

## 2015-03-17 LAB — BASIC METABOLIC PANEL
Anion gap: 8 (ref 5–15)
BUN: 16 mg/dL (ref 6–20)
CO2: 24 mmol/L (ref 22–32)
Calcium: 9.8 mg/dL (ref 8.9–10.3)
Chloride: 105 mmol/L (ref 101–111)
Creatinine, Ser: 0.78 mg/dL (ref 0.44–1.00)
GFR calc Af Amer: >60 mL/min (ref >60)
GFR calc non Af Amer: >60 mL/min (ref >60)
Glucose, Bld: 95 mg/dL (ref 65–99)
Potassium: 4 mmol/L (ref 3.5–5.1)
Sodium: 137 mmol/L (ref 135–145)

## 2015-03-17 LAB — URINALYSIS, ROUTINE W REFLEX MICROSCOPIC
Bilirubin Urine: NEGATIVE
Glucose, UA: NEGATIVE mg/dL
Ketones, ur: NEGATIVE mg/dL
Leukocytes, UA: NEGATIVE
Nitrite: NEGATIVE
Protein, ur: NEGATIVE mg/dL
Specific Gravity, Urine: 1.01 (ref 1.005–1.030)
pH: 6.5 (ref 5.0–8.0)

## 2015-03-17 LAB — TROPONIN I
Troponin I: <0.03 ng/mL (ref <0.031)
Troponin I: <0.03 ng/mL (ref <0.031)

## 2015-03-17 LAB — URINE MICROSCOPIC-ADD ON

## 2015-03-17 MED ORDER — IBUPROFEN 600 MG PO TABS: 600.0000 mg | ORAL_TABLET | Freq: Four times a day (QID) | ORAL | Status: DC | PRN

## 2015-03-17 NOTE — ED Notes (Signed)
MD at bedside. 

## 2015-03-17 NOTE — ED Notes (Signed)
CP x this week-sent from PCP office-newly dx HTN with meds to be started today

## 2015-03-17 NOTE — Discharge Instructions (Signed)

## 2015-03-17 NOTE — ED Provider Notes (Addendum)
CSN: [id number]     Arrival date & time 03/17/15  1324 History   First MD Initiated Contact with Patient 03/17/15 1458     Chief Complaint  Patient presents with  . Chest Pain     (Consider location/radiation/quality/duration/timing/severity/associated sxs/prior Treatment) HPI Comments: The patient is a 45 year old female, she has a history of HIV, history of having very good control over this, has history of herpetic infections as well as schizophrenia. She reports that she has recently started to feel intermittent dizziness, lightheadedness, chest discomfort, mild confusion, approximately one week ago she was seen at her OB/GYN office and was told that her blood pressure was 132/95. She became very upset feeling like people were withholding information from her and has been perseverating on this all week long going to the pharmacy everyday to have her blood pressure checked. Today she states that it was as high as 174/110. She reports that she's been having heaviness and tingling in her chest as well as numbness in her feet, no shortness of breath, no nausea vomiting swelling of the legs or fevers. She reports she last exercised last week with 30 minutes on a treadmill, 30 minutes on an exercise bike without any difficulty.  She is never had cardiac disease. She saw her medical doctor today who sent her to the emergency department for testing.  Patient is a 45 y.o. female presenting with chest pain. The history is provided by the patient.  Chest Pain   Past Medical History  Diagnosis Date  . HIV disease (HCC)   . Schizophrenia (HCC)   . Psychosis   . Depression   . Neuromuscular disorder (HCC)   . Paranoid schizophrenia (HCC)   . Sickle cell trait (HCC)   . 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 Hypertension    History reviewed. No pertinent past surgical history. Family History  Problem Relation Age of Onset  .  GER disease Mother   . Hypertension Mother   . Sickle cell anemia Brother   . Diabetes Maternal Aunt   . Diabetes Maternal Grandmother   . Throat cancer Paternal Grandmother   . Breast cancer Maternal Aunt    Social History  Substance Use Topics  . Smoking status: Never Smoker   . Smokeless tobacco: Never Used  . Alcohol Use: No   OB History    No data available     Review of Systems  Cardiovascular: Positive for chest pain.  All other systems reviewed and are negative.     Allergies  Latex  Home Medications   Prior to Admission medications   Medication Sig Start Date End Date Taking? Authorizing Provider  buPROPion (WELLBUTRIN SR) 150 MG 12 hr tablet Take 1 tablet (150 mg total) by mouth 2 (two) times daily. 06/24/12   Randall Hiss, MD  ibuprofen (ADVIL,MOTRIN) 600 MG tablet Take 1 tablet (600 mg total) by mouth every 6 (six) hours as needed. 03/17/15   Eber Hong, MD  paliperidone (INVEGA) 3 MG 24 hr tablet Take 1.5 mg by mouth daily.     Historical Provider, MD  valACYclovir (VALTREX) 1000 MG tablet Take 1,000 mg by mouth daily.    Historical Provider, MD   BP 143/99 mmHg  Pulse 97  Temp(Src) 98.2 F (36.8 C) (Oral)  Resp 20  Ht  (1.676 m)  Wt 261 lb (118.389 kg)  BMI 42.15 kg/m2  SpO2 99% Physical Exam  Constitutional: She appears well-developed and well-nourished. No distress.  HENT:  Head: Normocephalic and atraumatic.  Mouth/Throat: Oropharynx is clear and moist. No oropharyngeal exudate.  Eyes: Conjunctivae and EOM are normal. Pupils are equal, round, and reactive to light. Right eye exhibits no discharge. Left eye exhibits no discharge. No scleral icterus.  Neck: Normal range of motion. Neck supple. No JVD present. No thyromegaly present.  Cardiovascular: Normal rate, regular rhythm, normal heart sounds and intact distal pulses.  Exam reveals no gallop and no friction rub.   No murmur heard. Pulmonary/Chest: Effort normal and breath sounds  normal. No respiratory distress. She has no wheezes. She has no rales.  Abdominal: Soft. Bowel sounds are normal. She exhibits no distension and no mass. There is no tenderness.  Musculoskeletal: Normal range of motion. She exhibits no edema or tenderness.  Lymphadenopathy:    She has no cervical adenopathy.  Neurological: She is alert. Coordination normal.  Skin: Skin is warm and dry. No rash noted. No erythema.  Psychiatric: She has a normal mood and affect. Her behavior is normal.  Nursing note and vitals reviewed.   ED Course  Procedures (including critical care time) Labs Review Labs Reviewed  URINALYSIS, ROUTINE W REFLEX MICROSCOPIC (NOT AT [facility]) - Abnormal; Notable for the following:    Hgb urine dipstick MODERATE (*)    All other components within normal limits  URINE MICROSCOPIC-ADD ON - Abnormal; Notable for the following:    Squamous Epithelial / LPF 0-5 (*)    Bacteria, UA RARE (*)    All other components within normal limits  CBC  BASIC METABOLIC PANEL  TROPONIN I  TROPONIN I    Imaging Review Dg Chest 2 View  03/17/2015  CLINICAL DATA:  Chest heaviness and shortness of breath. EXAM: CHEST  2 VIEW COMPARISON:  03/15/2012 FINDINGS: Cardiomediastinal silhouette is normal. Mediastinal contours appear intact. There is no evidence of focal airspace consolidation, pleural effusion or pneumothorax. Osseous structures are without acute abnormality. Soft tissues are grossly normal. IMPRESSION: No active cardiopulmonary disease. Electronically Signed   By: Ted Mcalpine M.D.   On: 03/17/2015 15:17   I have personally reviewed and evaluated these images and lab results as part of my medical decision-making.   EKG Interpretation   Date/Time:  Friday March 17 2015 13:34:23 EST Ventricular Rate:  85 PR Interval:  142 QRS Duration: 72 QT Interval:  334 QTC Calculation: 397 R Axis:   -2 Text Interpretation:  Normal sinus rhythm with sinus arrhythmia Cannot  rule out  Anterior infarct , age undetermined Left axis deviation Abnormal  ekg No old tracing to compare Confirmed by Jeovanni Heuring  MD, Abrham Maslowski ([id number]) on  03/17/2015 3:28:42 PM      EKG Interpretation  Date/Time:  Friday March 17 2015 18:22:57 EST Ventricular Rate:  95 PR Interval:  144 QRS Duration: 76 QT Interval:  335 QTC Calculation: 421 R Axis:   -13 Text Interpretation:  Sinus rhythm Low voltage, precordial leads slight diffuse J point / ST elevation - no reciprocals Abnormal ekg Since last tracing changes noted Confirmed by Hyacinth Meeker  MD, Louan Base ([id number]) on 03/17/2015 7:10:51 PM         MDM   Final diagnoses:  Chest pain, unspecified chest pain type  Essential hypertension    The patient has a normal set of vital signs other than some hypertension, she has an EKG which is unremarkable except for left axis deviation however there is no old EKG. She has lab work which  is totally unremarkable as well. She will need a repeat troponin and EKG, she did take a hydrochlorothiazide of a coworker today and after that started to have some increased dizziness and lightheadedness. We'll need to have the discussion regarding hypertension, her family doctor will help drive the prescription for this.  D/w Dr. Anne Fu - he has viewed the ECG's and we agree that there is some mild sx of pericarditis - no signs of MI - 2 normal troponins and the pt is pain free - she has been informed of all her reulst - has her Rx for BP meds and will f/u with PCP.  Meds given in ED:  Medications - No data to display  New Prescriptions   IBUPROFEN (ADVIL,MOTRIN) 600 MG TABLET    Take 1 tablet (600 mg total) by mouth every 6 (six) hours as needed.    In addition to written d/c instructions, the pt was given verbal d/c instructions including the indications for return and expressed understanding to the instructions.   Eber Hong, MD 03/17/15 [place]  Eber Hong, MD 03/17/15 1910

## 2015-03-22 NOTE — Telephone Encounter (Signed)
PT NEEDS TO HAVE LABS DRAWN; ADAP APPLICATION NOT ABLE TO BE SUBMITTED UNTIL RESULTS HAVE BEEN RECORDED

## 2015-04-04 ENCOUNTER — Emergency Department (HOSPITAL_BASED_OUTPATIENT_CLINIC_OR_DEPARTMENT_OTHER)
Admission: EM | Admit: 2015-04-04 | Discharge: 2015-04-04 | Disposition: A | Discharge status: Home / Self Care | Payer: Self-pay | Attending: Emergency Medicine | Admitting: Emergency Medicine

## 2015-04-04 ENCOUNTER — Encounter (HOSPITAL_BASED_OUTPATIENT_CLINIC_OR_DEPARTMENT_OTHER): Payer: Self-pay

## 2015-04-04 ENCOUNTER — Other Ambulatory Visit: Payer: Self-pay

## 2015-04-04 DIAGNOSIS — I10 Essential (primary) hypertension: Secondary | ICD-10-CM | POA: Insufficient documentation

## 2015-04-04 DIAGNOSIS — F200 Paranoid schizophrenia: Secondary | ICD-10-CM | POA: Insufficient documentation

## 2015-04-04 DIAGNOSIS — Z3202 Encounter for pregnancy test, result negative: Secondary | ICD-10-CM | POA: Insufficient documentation

## 2015-04-04 DIAGNOSIS — Z79899 Other long term (current) drug therapy: Secondary | ICD-10-CM | POA: Insufficient documentation

## 2015-04-04 DIAGNOSIS — Z91040 Latex allergy status: Secondary | ICD-10-CM | POA: Insufficient documentation

## 2015-04-04 DIAGNOSIS — R42 Dizziness and giddiness: Secondary | ICD-10-CM | POA: Insufficient documentation

## 2015-04-04 DIAGNOSIS — B20 Human immunodeficiency virus [HIV] disease: Secondary | ICD-10-CM | POA: Insufficient documentation

## 2015-04-04 DIAGNOSIS — Z8669 Personal history of other diseases of the nervous system and sense organs: Secondary | ICD-10-CM | POA: Insufficient documentation

## 2015-04-04 DIAGNOSIS — Z862 Personal history of diseases of the blood and blood-forming organs and certain disorders involving the immune mechanism: Secondary | ICD-10-CM | POA: Insufficient documentation

## 2015-04-04 DIAGNOSIS — F329 Major depressive disorder, single episode, unspecified: Secondary | ICD-10-CM | POA: Insufficient documentation

## 2015-04-04 LAB — URINALYSIS, ROUTINE W REFLEX MICROSCOPIC
Bilirubin Urine: NEGATIVE
Glucose, UA: NEGATIVE mg/dL
Ketones, ur: NEGATIVE mg/dL
Leukocytes, UA: NEGATIVE
Nitrite: NEGATIVE
Protein, ur: NEGATIVE mg/dL
Specific Gravity, Urine: 1.011 (ref 1.005–1.030)
pH: 6.5 (ref 5.0–8.0)

## 2015-04-04 LAB — CBC WITH DIFFERENTIAL/PLATELET
Basophils Absolute: 0 10*3/uL (ref 0.0–0.1)
Basophils Relative: 0 %
Eosinophils Absolute: 0.1 10*3/uL (ref 0.0–0.7)
Eosinophils Relative: 1 %
HCT: 37.9 % (ref 36.0–46.0)
Hemoglobin: 12.6 g/dL (ref 12.0–15.0)
Lymphocytes Relative: 26 %
Lymphs Abs: 2.4 10*3/uL (ref 0.7–4.0)
MCH: 27.8 pg (ref 26.0–34.0)
MCHC: 33.2 g/dL (ref 30.0–36.0)
MCV: 83.5 fL (ref 78.0–100.0)
Monocytes Absolute: 0.8 10*3/uL (ref 0.1–1.0)
Monocytes Relative: 9 %
Neutro Abs: 5.8 10*3/uL (ref 1.7–7.7)
Neutrophils Relative %: 64 %
Platelets: 291 10*3/uL (ref 150–400)
RBC: 4.54 MIL/uL (ref 3.87–5.11)
RDW: 14.5 % (ref 11.5–15.5)
WBC: 9.2 10*3/uL (ref 4.0–10.5)

## 2015-04-04 LAB — BASIC METABOLIC PANEL
Anion gap: 9 (ref 5–15)
BUN: 17 mg/dL (ref 6–20)
CO2: 24 mmol/L (ref 22–32)
Calcium: 9.3 mg/dL (ref 8.9–10.3)
Chloride: 105 mmol/L (ref 101–111)
Creatinine, Ser: 0.82 mg/dL (ref 0.44–1.00)
GFR calc Af Amer: >60 mL/min (ref >60)
GFR calc non Af Amer: >60 mL/min (ref >60)
Glucose, Bld: 94 mg/dL (ref 65–99)
Potassium: 4.1 mmol/L (ref 3.5–5.1)
Sodium: 138 mmol/L (ref 135–145)

## 2015-04-04 LAB — URINE MICROSCOPIC-ADD ON

## 2015-04-04 LAB — PREGNANCY, URINE: Preg Test, Ur: NEGATIVE

## 2015-04-04 MED ORDER — SODIUM CHLORIDE 0.9 % IV BOLUS (SEPSIS)
1000.0000 mL | Freq: Once | INTRAVENOUS | Status: AC
  Administered 2015-04-04: 1000 mL via INTRAVENOUS

## 2015-04-04 NOTE — ED Notes (Addendum)
C/o dizziness since starting lisinopril 03/19/15-seen by PCP yesterday-advised dizziness and cough "will go away"-pt NAD-also c/o "chest heaviness" intermittent x 1 month-denies CP at present

## 2015-04-04 NOTE — Discharge Instructions (Signed)
Return here as needed.  Follow-up your primary care doctor, stop the lisinopril

## 2015-04-04 NOTE — ED Provider Notes (Signed)
CSN: [id number]     Arrival date & time 04/04/15  1158 History   First MD Initiated Contact with Patient 04/04/15 1407     Chief Complaint  Patient presents with  . Dizziness     (Consider location/radiation/quality/duration/timing/severity/associated sxs/prior Treatment) Patient is a 45 y.o. female presenting with dizziness.  Dizziness  The patient presents to the Emergency Department today complaining of intermittent dizziness. She has a past medical history of HIV, herpetic infections, and schizophrenia. She states that she has been having episodic complaints of dizziness over the past month. She states that she was just recently started on Lisinopril and ever since, she is experiencing a dry cough, and intermittent dizziness. The episodes of dizziness last several seconds and then reside. The patient does not recall anything that triggers the dizzy spells, they are random. The dizziness is more of an uneasiness and lightheaded feeling. She states that she came in today due to an episode of dizziness that occurred while she was driving that did not resolve like it usually does. The patient presented to the ED last month with complaints of chest pain that felt like a heaviness on her chest, she states that she continues to have the heaviness intermittently but that it is not as noticeable as it was. She endorses some nausea during the episodes of dizziness. She had one episode of epistaxis on Saturday. The patient endorses having trouble with her vision at night, but this is not a new problem. She endorses urinary frequency. She denies vomiting, diarrhea, abdominal pain, SOB, fevers, chills, night sweats, vision changes, headache, palpitations, syncope, weakness, numbness or tingling.  Past Medical History  Diagnosis Date  . HIV disease (HCC)   . Schizophrenia (HCC)   . Psychosis   . Depression   . Neuromuscular disorder (HCC)   . Paranoid schizophrenia (HCC)   . Sickle cell trait (HCC)   .  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 Hypertension    History reviewed. No pertinent past surgical history. Family History  Problem Relation Age of Onset  . GER disease Mother   . Hypertension Mother   . Sickle cell anemia Brother   . Diabetes Maternal Aunt   . Diabetes Maternal Grandmother   . Throat cancer Paternal Grandmother   . Breast cancer Maternal Aunt    Social History  Substance Use Topics  . Smoking status: Never Smoker   . Smokeless tobacco: Never Used  . Alcohol Use: No   OB History    No data available     Review of Systems  Neurological: Positive for dizziness.   All other systems negative except as documented in the HPI. All pertinent positives and negatives as reviewed in the HPI.    Allergies  Latex  Home Medications   Prior to Admission medications   Medication Sig Start Date End Date Taking? Authorizing Provider  LISINOPRIL PO Take by mouth.   Yes Historical Provider, MD  buPROPion (WELLBUTRIN SR) 150 MG 12 hr tablet Take 1 tablet (150 mg total) by mouth 2 (two) times daily. 06/24/12   Randall Hiss, MD  ibuprofen (ADVIL,MOTRIN) 600 MG tablet Take 1 tablet (600 mg total) by mouth every 6 (six) hours as needed. 03/17/15   Eber Hong, MD  paliperidone (INVEGA) 3 MG 24 hr tablet Take 1.5 mg by mouth daily.     Historical Provider, MD  valACYclovir (VALTREX) 1000 MG tablet Take 1,000 mg  by mouth daily.    Historical Provider, MD   BP 107/70 mmHg  Pulse 92  Temp(Src) 98.8 F (37.1 C) (Oral)  Resp 16  Ht  (1.702 m)  Wt 120.203 kg  BMI 41.50 kg/m2  SpO2 99% Physical Exam  Constitutional: She is oriented to person, place, and time. She appears well-developed and well-nourished. No distress.  HENT:  Head: Normocephalic and atraumatic.  Eyes: Conjunctivae and EOM are normal. Pupils are equal, round, and reactive to light.  Neck: Normal range of motion. Neck supple.   Cardiovascular: Normal rate, regular rhythm and normal heart sounds.  Exam reveals no gallop and no friction rub.   No murmur heard. Pulmonary/Chest: Effort normal and breath sounds normal. No respiratory distress. She has no wheezes. She has no rales.  Abdominal: Soft. Bowel sounds are normal. She exhibits no distension. There is no tenderness. There is no rebound and no guarding.  Musculoskeletal: Normal range of motion. She exhibits no edema.  Neurological: She is alert and oriented to person, place, and time. She has normal reflexes.  Skin: Skin is warm and dry.  Psychiatric:  Patient has flat affect, tangential thought process     ED Course  Procedures (including critical care time) Labs Review Labs Reviewed  URINALYSIS, ROUTINE W REFLEX MICROSCOPIC (NOT AT [facility]) - Abnormal; Notable for the following:    Hgb urine dipstick MODERATE (*)    All other components within normal limits  URINE MICROSCOPIC-ADD ON - Abnormal; Notable for the following:    Squamous Epithelial / LPF 0-5 (*)    Bacteria, UA RARE (*)    All other components within normal limits  PREGNANCY, URINE  BASIC METABOLIC PANEL  CBC WITH DIFFERENTIAL/PLATELET    Imaging Review No results found. I have personally reviewed and evaluated these images and lab results as part of my medical decision-making.   EKG Interpretation   Date/Time:  Tuesday April 04 2015 12:20:18 EST Ventricular Rate:  93 PR Interval:  142 QRS Duration: 78 QT Interval:  322 QTC Calculation: 400 R Axis:   33 Text Interpretation:  Normal sinus rhythm Cannot rule out Anterior infarct  , age undetermined Abnormal ECG Confirmed by Lincoln Brigham [phone]) on 04/04/2015  12:45:57 PM      I feel that the patient's dizziness and symptoms have come from recent blood pressure medications that were added.  Patient is advised to follow-up with her primary care Dr. told to return here as needed.  Patient's laboratory testing is stable and the patient  is feeling improved here in the emergency department.  Patient agrees the plan and all questions were answered    Charlestine Night, PA-C 04/04/15 1611  Tilden Fossa, MD 04/05/15 [phone]

## 2015-05-08 ENCOUNTER — Emergency Department (HOSPITAL_BASED_OUTPATIENT_CLINIC_OR_DEPARTMENT_OTHER): Payer: Self-pay

## 2015-05-08 ENCOUNTER — Emergency Department (HOSPITAL_BASED_OUTPATIENT_CLINIC_OR_DEPARTMENT_OTHER)
Admission: EM | Admit: 2015-05-08 | Discharge: 2015-05-08 | Disposition: A | Discharge status: Home / Self Care | Payer: Self-pay | Attending: Emergency Medicine | Admitting: Emergency Medicine

## 2015-05-08 ENCOUNTER — Encounter (HOSPITAL_BASED_OUTPATIENT_CLINIC_OR_DEPARTMENT_OTHER): Payer: Self-pay | Admitting: *Deleted

## 2015-05-08 DIAGNOSIS — J069 Acute upper respiratory infection, unspecified: Secondary | ICD-10-CM | POA: Insufficient documentation

## 2015-05-08 DIAGNOSIS — Z79899 Other long term (current) drug therapy: Secondary | ICD-10-CM | POA: Insufficient documentation

## 2015-05-08 DIAGNOSIS — Z91040 Latex allergy status: Secondary | ICD-10-CM | POA: Insufficient documentation

## 2015-05-08 DIAGNOSIS — J040 Acute laryngitis: Secondary | ICD-10-CM | POA: Insufficient documentation

## 2015-05-08 DIAGNOSIS — Z862 Personal history of diseases of the blood and blood-forming organs and certain disorders involving the immune mechanism: Secondary | ICD-10-CM | POA: Insufficient documentation

## 2015-05-08 DIAGNOSIS — Z21 Asymptomatic human immunodeficiency virus [HIV] infection status: Secondary | ICD-10-CM | POA: Insufficient documentation

## 2015-05-08 DIAGNOSIS — F29 Unspecified psychosis not due to a substance or known physiological condition: Secondary | ICD-10-CM | POA: Insufficient documentation

## 2015-05-08 DIAGNOSIS — Z8619 Personal history of other infectious and parasitic diseases: Secondary | ICD-10-CM | POA: Insufficient documentation

## 2015-05-08 DIAGNOSIS — F329 Major depressive disorder, single episode, unspecified: Secondary | ICD-10-CM | POA: Insufficient documentation

## 2015-05-08 DIAGNOSIS — F209 Schizophrenia, unspecified: Secondary | ICD-10-CM | POA: Insufficient documentation

## 2015-05-08 DIAGNOSIS — I10 Essential (primary) hypertension: Secondary | ICD-10-CM | POA: Insufficient documentation

## 2015-05-08 MED ORDER — PROMETHAZINE-DM 6.25-15 MG/5ML PO SYRP: 5.0000 mL | ORAL_SOLUTION | Freq: Four times a day (QID) | ORAL | Status: DC | PRN

## 2015-05-08 MED ORDER — DEXAMETHASONE SODIUM PHOSPHATE 10 MG/ML IJ SOLN
8.0000 mg | Freq: Once | INTRAMUSCULAR | Status: AC
  Administered 2015-05-08: 8 mg via INTRAMUSCULAR
  Filled 2015-05-08: qty 1

## 2015-05-08 MED ORDER — GUAIFENESIN ER 1200 MG PO TB12: 1.0000 | ORAL_TABLET | Freq: Two times a day (BID) | ORAL | Status: DC

## 2015-05-08 MED ORDER — PREDNISONE 50 MG PO TABS: 50.0000 mg | ORAL_TABLET | Freq: Every day | ORAL | Status: DC

## 2015-05-08 NOTE — Discharge Instructions (Signed)
Return here as needed. Follow up with your primary doctor. Increase your fluid intake. °

## 2015-05-08 NOTE — ED Provider Notes (Signed)
CSN: [id number]     Arrival date & time 05/08/15  1346 History   First MD Initiated Contact with Patient 05/08/15 1604     Chief Complaint  Patient presents with  . URI     (Consider location/radiation/quality/duration/timing/severity/associated sxs/prior Treatment) HPI Patient presents to the emergency department with cough, hoarseness and nasal congestion over the last week.  The patient states that she has taken DayQuil and NyQuil for her symptoms without significant relief.  The patient states that nothing seems make the condition better or worse.  She states that her cough is worse at night.  The patient states that her voice comes and goes.  She states she does do a lot of talking on the phone at work.  The patient denies nausea, vomiting, diarrhea, chest pain, shortness of breath, weakness, dizziness, headache, blurred vision, fever, dysuria, incontinence, abdominal pain, rash, near syncope or syncope Past Medical History  Diagnosis Date  . HIV disease (HCC)   . Schizophrenia (HCC)   . Psychosis   . Depression   . Paranoid schizophrenia (HCC)   . Sickle cell trait (HCC)   . 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 Hypertension    History reviewed. No pertinent past surgical history. Family History  Problem Relation Age of Onset  . GER disease Mother   . Hypertension Mother   . Sickle cell anemia Brother   . Diabetes Maternal Aunt   . Diabetes Maternal Grandmother   . Throat cancer Paternal Grandmother   . Breast cancer Maternal Aunt    Social History  Substance Use Topics  . Smoking status: Never Smoker   . Smokeless tobacco: Never Used  . Alcohol Use: No   OB History    No data available     Review of Systems  All other systems negative except as documented in the HPI. All pertinent positives and negatives as reviewed in the HPI.  Allergies  Latex  Home Medications   Prior to Admission  medications   Medication Sig Start Date End Date Taking? Authorizing Provider  amLODipine (NORVASC) 5 MG tablet Take 5 mg by mouth daily.   Yes Historical Provider, MD  hydrochlorothiazide (HYDRODIURIL) 25 MG tablet Take 25 mg by mouth daily.   Yes Historical Provider, MD  buPROPion (WELLBUTRIN SR) 150 MG 12 hr tablet Take 1 tablet (150 mg total) by mouth 2 (two) times daily. 06/24/12   Randall Hiss, MD  ibuprofen (ADVIL,MOTRIN) 600 MG tablet Take 1 tablet (600 mg total) by mouth every 6 (six) hours as needed. 03/17/15   Eber Hong, MD  paliperidone (INVEGA) 3 MG 24 hr tablet Take 1.5 mg by mouth daily.     Historical Provider, MD  valACYclovir (VALTREX) 1000 MG tablet Take 1,000 mg by mouth daily.    Historical Provider, MD   BP 135/98 mmHg  Pulse 99  Temp(Src) 99.4 F (37.4 C) (Oral)  Resp 18  Ht  (1.676 m)  Wt 117.935 kg  BMI 41.99 kg/m2  SpO2 96% Physical Exam  Constitutional: She is oriented to person, place, and time. She appears well-developed and well-nourished. No distress.  HENT:  Head: Normocephalic and atraumatic.  Mouth/Throat: Oropharynx is clear and moist.  Eyes: Pupils are equal, round, and reactive to light.  Neck: Normal range of motion. Neck supple.  Cardiovascular: Normal rate, regular rhythm and normal heart sounds.  Exam reveals no gallop and no friction rub.  No murmur heard. Pulmonary/Chest: Effort normal and breath sounds normal. No respiratory distress. She has no wheezes.  Abdominal: Soft. Bowel sounds are normal. She exhibits no distension. There is no tenderness.  Neurological: She is alert and oriented to person, place, and time. She exhibits normal muscle tone. Coordination normal.  Skin: Skin is warm and dry. No rash noted. No erythema.  Psychiatric: She has a normal mood and affect. Her behavior is normal.  Nursing note and vitals reviewed.   ED Course  Procedures (including critical care time) Labs Review Labs Reviewed - No data to  display  Imaging Review Dg Chest 2 View  05/08/2015  CLINICAL DATA:  Pt co cough, congestion, hoarseness x 2-3 wks EXAM: CHEST - 2 VIEW COMPARISON:  03/17/2015 FINDINGS: Heart size upper limits normal.  Lungs clear.  No pneumothorax. No effusion. Visualized skeletal structures are unremarkable. IMPRESSION: No acute cardiopulmonary disease. Electronically Signed   By: Corlis Leak  Hassell M.D.   On: 05/08/2015 16:13   I have personally reviewed and evaluated these images and lab results as part of my medical decision-making.  The patient will be treated for URI symptoms with laryngitis. Told to increase her fluid intake. Told to return here as needed.    Charlestine NightChristopher Cyrena Kuchenbecker, PA-C 05/08/15 1722  Rolan BuccoMelanie Belfi, MD 05/08/15 [phone]

## 2015-05-08 NOTE — ED Notes (Signed)
C/o cough, hoarseness congestion x 1 week denies pain

## 2015-05-08 NOTE — ED Notes (Signed)
Pt hoarse, reports cough, congestion x 1 week.  Denies pain, reports head feels full.

## 2015-09-29 ENCOUNTER — Other Ambulatory Visit (INDEPENDENT_AMBULATORY_CARE_PROVIDER_SITE_OTHER): Payer: Self-pay

## 2015-09-29 DIAGNOSIS — B20 Human immunodeficiency virus [HIV] disease: Secondary | ICD-10-CM

## 2015-09-29 DIAGNOSIS — Z113 Encounter for screening for infections with a predominantly sexual mode of transmission: Secondary | ICD-10-CM

## 2015-09-29 DIAGNOSIS — Z79899 Other long term (current) drug therapy: Secondary | ICD-10-CM

## 2015-09-29 LAB — COMPLETE METABOLIC PANEL WITH GFR
ALT: 18 U/L (ref 6–29)
AST: 15 U/L (ref 10–30)
Albumin: 4.1 g/dL (ref 3.6–5.1)
Alkaline Phosphatase: 80 U/L (ref 33–115)
BUN: 6 mg/dL — ABNORMAL LOW (ref 7–25)
CO2: 24 mmol/L (ref 20–31)
Calcium: 9 mg/dL (ref 8.6–10.2)
Chloride: 105 mmol/L (ref 98–110)
Creat: 0.84 mg/dL (ref 0.50–1.10)
GFR, Est African American: >89 mL/min (ref >=60)
GFR, Est Non African American: 86 mL/min (ref >=60)
Glucose, Bld: 98 mg/dL (ref 65–99)
Potassium: 3.4 mmol/L — ABNORMAL LOW (ref 3.5–5.3)
Sodium: 139 mmol/L (ref 135–146)
Total Bilirubin: 0.4 mg/dL (ref 0.2–1.2)
Total Protein: 6.9 g/dL (ref 6.1–8.1)

## 2015-09-29 LAB — CBC WITH DIFFERENTIAL/PLATELET
Basophils Absolute: 0 cells/uL (ref 0–200)
Basophils Relative: 0 %
Eosinophils Absolute: 94 cells/uL (ref 15–500)
Eosinophils Relative: 2 %
HCT: 36.6 % (ref 35.0–45.0)
Hemoglobin: 12.4 g/dL (ref 11.7–15.5)
Lymphocytes Relative: 33 %
Lymphs Abs: 1551 cells/uL (ref 850–3900)
MCH: 27.9 pg (ref 27.0–33.0)
MCHC: 33.9 g/dL (ref 32.0–36.0)
MCV: 82.2 fL (ref 80.0–100.0)
MPV: 10.2 fL (ref 7.5–12.5)
Monocytes Absolute: 470 cells/uL (ref 200–950)
Monocytes Relative: 10 %
Neutro Abs: 2585 cells/uL (ref 1500–7800)
Neutrophils Relative %: 55 %
Platelets: 285 10*3/uL (ref 140–400)
RBC: 4.45 MIL/uL (ref 3.80–5.10)
RDW: 15.9 % — ABNORMAL HIGH (ref 11.0–15.0)
WBC: 4.7 10*3/uL (ref 3.8–10.8)

## 2015-09-29 LAB — LIPID PANEL
Cholesterol: 123 mg/dL — ABNORMAL LOW (ref 125–200)
HDL: 29 mg/dL — ABNORMAL LOW (ref >=46)
LDL Cholesterol: 77 mg/dL (ref <130)
Total CHOL/HDL Ratio: 4.2 Ratio (ref <=5.0)
Triglycerides: 84 mg/dL (ref <150)
VLDL: 17 mg/dL (ref <30)

## 2015-09-29 LAB — T-HELPER CELLS (CD4) COUNT (NOT AT ARMC)
CD4 % Helper T Cell: 41 % (ref 33–55)
CD4 T Cell Abs: 720 /uL (ref 400–2700)

## 2015-09-30 LAB — RPR

## 2015-10-02 ENCOUNTER — Encounter: Payer: Self-pay | Admitting: Infectious Disease

## 2015-10-02 LAB — HIV-1 RNA QUANT-NO REFLEX-BLD
HIV 1 RNA Quant: <20 copies/mL (ref <20)
HIV-1 RNA Quant, Log: <1.3 Log copies/mL (ref <1.30)

## 2015-10-02 LAB — URINE CYTOLOGY ANCILLARY ONLY
Chlamydia: NEGATIVE
Neisseria Gonorrhea: NEGATIVE

## 2015-10-06 ENCOUNTER — Other Ambulatory Visit: Payer: Self-pay

## 2015-10-23 ENCOUNTER — Ambulatory Visit: Payer: Self-pay | Admitting: Infectious Disease

## 2016-02-13 ENCOUNTER — Telehealth: Payer: Self-pay | Admitting: Infectious Disease

## 2016-02-13 NOTE — Telephone Encounter (Signed)
Per chart review, patient is an elite controller and not on antiretroviral therapy. She still qualifies for ADAP even though she does not take HAART.  Notified pharmacy, they will note her profile. Andree CossHowell, Reyn Faivre M, RN

## 2016-02-13 NOTE — Telephone Encounter (Signed)
[facility] called to let us know that they have no antiviral medication on file for the patient.

## 2016-04-10 ENCOUNTER — Other Ambulatory Visit: Payer: Self-pay

## 2016-04-10 ENCOUNTER — Ambulatory Visit: Payer: Self-pay

## 2016-04-11 ENCOUNTER — Ambulatory Visit: Payer: Self-pay

## 2016-04-11 ENCOUNTER — Other Ambulatory Visit (INDEPENDENT_AMBULATORY_CARE_PROVIDER_SITE_OTHER): Payer: Self-pay

## 2016-04-11 DIAGNOSIS — Z113 Encounter for screening for infections with a predominantly sexual mode of transmission: Secondary | ICD-10-CM

## 2016-04-11 DIAGNOSIS — Z79899 Other long term (current) drug therapy: Secondary | ICD-10-CM

## 2016-04-11 DIAGNOSIS — B20 Human immunodeficiency virus [HIV] disease: Secondary | ICD-10-CM

## 2016-04-11 LAB — CBC WITH DIFFERENTIAL/PLATELET
Basophils Absolute: 61 cells/uL (ref 0–200)
Basophils Relative: 1 %
Eosinophils Absolute: 61 cells/uL (ref 15–500)
Eosinophils Relative: 1 %
HCT: 39.4 % (ref 35.0–45.0)
Hemoglobin: 13.1 g/dL (ref 11.7–15.5)
Lymphocytes Relative: 30 %
Lymphs Abs: 1830 cells/uL (ref 850–3900)
MCH: 28.4 pg (ref 27.0–33.0)
MCHC: 33.2 g/dL (ref 32.0–36.0)
MCV: 85.3 fL (ref 80.0–100.0)
MPV: 9.9 fL (ref 7.5–12.5)
Monocytes Absolute: 732 cells/uL (ref 200–950)
Monocytes Relative: 12 %
Neutro Abs: 3416 cells/uL (ref 1500–7800)
Neutrophils Relative %: 56 %
Platelets: 314 10*3/uL (ref 140–400)
RBC: 4.62 MIL/uL (ref 3.80–5.10)
RDW: 14.1 % (ref 11.0–15.0)
WBC: 6.1 10*3/uL (ref 3.8–10.8)

## 2016-04-11 LAB — COMPLETE METABOLIC PANEL WITH GFR
ALT: 19 U/L (ref 6–29)
AST: 16 U/L (ref 10–30)
Albumin: 4.2 g/dL (ref 3.6–5.1)
Alkaline Phosphatase: 97 U/L (ref 33–115)
BUN: 12 mg/dL (ref 7–25)
CO2: 25 mmol/L (ref 20–31)
Calcium: 9.3 mg/dL (ref 8.6–10.2)
Chloride: 100 mmol/L (ref 98–110)
Creat: 0.97 mg/dL (ref 0.50–1.10)
GFR, Est African American: 83 mL/min (ref >=60)
GFR, Est Non African American: 72 mL/min (ref >=60)
Glucose, Bld: 92 mg/dL (ref 65–99)
Potassium: 3.5 mmol/L (ref 3.5–5.3)
Sodium: 137 mmol/L (ref 135–146)
Total Bilirubin: 0.4 mg/dL (ref 0.2–1.2)
Total Protein: 7.8 g/dL (ref 6.1–8.1)

## 2016-04-11 LAB — LIPID PANEL
Cholesterol: 140 mg/dL (ref <200)
HDL: 35 mg/dL — ABNORMAL LOW (ref >50)
LDL Cholesterol: 88 mg/dL (ref <100)
Total CHOL/HDL Ratio: 4 Ratio (ref <5.0)
Triglycerides: 87 mg/dL (ref <150)
VLDL: 17 mg/dL (ref <30)

## 2016-04-12 LAB — T-HELPER CELL (CD4) - (RCID CLINIC ONLY)
CD4 % Helper T Cell: 44 % (ref 33–55)
CD4 T Cell Abs: 800 /uL (ref 400–2700)

## 2016-04-12 LAB — URINE CYTOLOGY ANCILLARY ONLY
Chlamydia: NEGATIVE
Neisseria Gonorrhea: NEGATIVE

## 2016-04-12 LAB — RPR

## 2016-04-15 ENCOUNTER — Encounter: Payer: Self-pay | Admitting: Infectious Disease

## 2016-04-18 LAB — HIV-1 RNA QUANT-NO REFLEX-BLD
HIV 1 RNA Quant: <20 copies/mL
HIV-1 RNA Quant, Log: <1.3 Log copies/mL

## 2016-04-20 ENCOUNTER — Emergency Department (HOSPITAL_BASED_OUTPATIENT_CLINIC_OR_DEPARTMENT_OTHER): Payer: Self-pay

## 2016-04-20 ENCOUNTER — Encounter (HOSPITAL_BASED_OUTPATIENT_CLINIC_OR_DEPARTMENT_OTHER): Payer: Self-pay | Admitting: *Deleted

## 2016-04-20 DIAGNOSIS — R109 Unspecified abdominal pain: Secondary | ICD-10-CM | POA: Insufficient documentation

## 2016-04-20 DIAGNOSIS — Z79899 Other long term (current) drug therapy: Secondary | ICD-10-CM | POA: Insufficient documentation

## 2016-04-20 DIAGNOSIS — Z91040 Latex allergy status: Secondary | ICD-10-CM | POA: Insufficient documentation

## 2016-04-20 DIAGNOSIS — R0789 Other chest pain: Secondary | ICD-10-CM | POA: Insufficient documentation

## 2016-04-20 DIAGNOSIS — Z760 Encounter for issue of repeat prescription: Secondary | ICD-10-CM | POA: Insufficient documentation

## 2016-04-20 DIAGNOSIS — Z21 Asymptomatic human immunodeficiency virus [HIV] infection status: Secondary | ICD-10-CM | POA: Insufficient documentation

## 2016-04-20 LAB — BASIC METABOLIC PANEL
Anion gap: 5 (ref 5–15)
BUN: 11 mg/dL (ref 6–20)
CO2: 24 mmol/L (ref 22–32)
Calcium: 8.9 mg/dL (ref 8.9–10.3)
Chloride: 106 mmol/L (ref 101–111)
Creatinine, Ser: 0.77 mg/dL (ref 0.44–1.00)
GFR calc Af Amer: >60 mL/min (ref >60)
GFR calc non Af Amer: >60 mL/min (ref >60)
Glucose, Bld: 111 mg/dL — ABNORMAL HIGH (ref 65–99)
Potassium: 3.6 mmol/L (ref 3.5–5.1)
Sodium: 135 mmol/L (ref 135–145)

## 2016-04-20 LAB — TROPONIN I: Troponin I: <0.03 ng/mL (ref <0.03)

## 2016-04-20 LAB — CBC
HCT: 34.8 % — ABNORMAL LOW (ref 36.0–46.0)
Hemoglobin: 12.1 g/dL (ref 12.0–15.0)
MCH: 29.2 pg (ref 26.0–34.0)
MCHC: 34.8 g/dL (ref 30.0–36.0)
MCV: 84.1 fL (ref 78.0–100.0)
Platelets: 289 10*3/uL (ref 150–400)
RBC: 4.14 MIL/uL (ref 3.87–5.11)
RDW: 13.7 % (ref 11.5–15.5)
WBC: 6.6 10*3/uL (ref 4.0–10.5)

## 2016-04-20 NOTE — ED Triage Notes (Addendum)
Pt states she had some diarrhea x 1 earlier today. Ate at around 5-6p. Around 9-10p, began having abd cramping. Denies urinary s/s. Also reports CP yesterday. Describes as heaviness. Denies other s/s. Stopped taking BP meds earlier this week. Primary told her she had to come in before they would give her Rxs.

## 2016-04-21 ENCOUNTER — Emergency Department (HOSPITAL_BASED_OUTPATIENT_CLINIC_OR_DEPARTMENT_OTHER)
Admission: EM | Admit: 2016-04-21 | Discharge: 2016-04-21 | Disposition: A | Discharge status: Home / Self Care | Payer: Self-pay | Attending: Emergency Medicine | Admitting: Emergency Medicine

## 2016-04-21 DIAGNOSIS — R109 Unspecified abdominal pain: Secondary | ICD-10-CM

## 2016-04-21 DIAGNOSIS — Z760 Encounter for issue of repeat prescription: Secondary | ICD-10-CM

## 2016-04-21 LAB — URINALYSIS, MICROSCOPIC (REFLEX)

## 2016-04-21 LAB — URINALYSIS, ROUTINE W REFLEX MICROSCOPIC
Bilirubin Urine: NEGATIVE
Glucose, UA: NEGATIVE mg/dL
Ketones, ur: NEGATIVE mg/dL
Leukocytes, UA: NEGATIVE
Nitrite: NEGATIVE
Protein, ur: NEGATIVE mg/dL
Specific Gravity, Urine: 1.013 (ref 1.005–1.030)
pH: 6 (ref 5.0–8.0)

## 2016-04-21 MED ORDER — HYDROCHLOROTHIAZIDE 25 MG PO TABS: 25.0000 mg | ORAL_TABLET | Freq: Every day | ORAL | 0 refills | Status: DC

## 2016-04-21 MED ORDER — AMLODIPINE BESYLATE 5 MG PO TABS: 5.0000 mg | ORAL_TABLET | Freq: Every day | ORAL | 0 refills | Status: DC

## 2016-04-21 MED ORDER — METOCLOPRAMIDE HCL 10 MG PO TABS: 10.0000 mg | ORAL_TABLET | Freq: Four times a day (QID) | ORAL | 0 refills | Status: DC | PRN

## 2016-04-21 NOTE — ED Notes (Signed)
ED Provider at bedside. 

## 2016-04-21 NOTE — ED Notes (Signed)
Pt given d/c instructions as per chart. Rx x 3. Verbalizes understanding. No questions. 

## 2016-04-21 NOTE — ED Provider Notes (Signed)
[facility] Provider Note: Adriana Fernandez Hetty Linhart, MD, FACEP  CSN: [id number] MRN: [id number] ARRIVAL: 04/20/16 at 2221 ROOM: MH04/MH04   CHIEF COMPLAINT  Abdominal Pain   HISTORY OF PRESENT ILLNESS  Adriana Fernandez is a 46 y.o. female who developed what she describes as severe mid abdominal cramping about 9 PM yesterday evening. There was associated nausea but no vomiting. She had one episode of diarrhea. Her nausea and cramping have subsequently resolved without intervention. She denies fever or urinary symptoms. She denies chest pain or shortness of breath although she did have some chest pressure earlier in the week. She is HIV positive but is not on any antiretrovirals due to undetectable viral load and good CD4 count. She is out of her antihypertensives and has not been able to get into her PCP for refills.    Past Medical History:  Diagnosis Date  . Depression   . HIV disease (HCC)   . 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 Hypertension   . Paranoid schizophrenia (HCC)   . Psychosis   . Schizophrenia (HCC)   . Sickle cell trait (HCC)     History reviewed. No pertinent surgical history.  Family History  Problem Relation Age of Onset  . GER disease Mother   . Hypertension Mother   . Sickle cell anemia Brother   . Diabetes Maternal Aunt   . Diabetes Maternal Grandmother   . Breast cancer Maternal Aunt   . Throat cancer Paternal Grandmother     Social History  Substance Use Topics  . Smoking status: Never Smoker  . Smokeless tobacco: Never Used  . Alcohol use No    Prior to Admission medications   Medication Sig Start Date End Date Taking? Authorizing Provider  amLODipine (NORVASC) 5 MG tablet Take 1 tablet (5 mg total) by mouth daily. 04/21/16   Keimani Laufer, MD  buPROPion (WELLBUTRIN SR) 150 MG 12 hr tablet Take 1 tablet (150 mg total) by mouth 2 (two) times daily. 06/24/12   Randall Hissornelius N  Van Dam, MD  hydrochlorothiazide (HYDRODIURIL) 25 MG tablet Take 1 tablet (25 mg total) by mouth daily. 04/21/16   Paula LibraJohn Carold Eisner, MD  metoCLOPramide (REGLAN) 10 MG tablet Take 1 tablet (10 mg total) by mouth every 6 (six) hours as needed for nausea or vomiting (nausea/headache). 04/21/16   Aloys Hupfer, MD  paliperidone (INVEGA) 3 MG 24 hr tablet Take 1.5 mg by mouth daily.     Historical Provider, MD  valACYclovir (VALTREX) 1000 MG tablet Take 1,000 mg by mouth daily.    Historical Provider, MD    Allergies Latex   REVIEW OF SYSTEMS  Negative except as noted here or in the History of Present Illness.   PHYSICAL EXAMINATION  Initial Vital Signs Blood pressure 105/76, pulse 66, temperature 97.9 F (36.6 C), temperature source Oral, resp. rate 24, height 5\' 7"  (1.702 m), weight 190 lb 2 oz (86.2 kg), SpO2 98 %.  Examination General: Well-developed, well-nourished female in no acute distress; appearance consistent with age of record HENT: normocephalic; atraumatic Eyes: pupils equal, round and reactive to light; extraocular muscles intact Neck: supple Heart: regular rate and rhythm Lungs: clear to auscultation bilaterally Abdomen: soft; nondistended; nontender; no masses or hepatosplenomegaly; bowel sounds present Extremities: No deformity; full range of motion; pulses normal Neurologic: Awake, alert and oriented; motor function intact in all extremities and symmetric; no facial droop Skin: Warm and dry Psychiatric: Normal  mood and affect   RESULTS  Summary of this visit's results, reviewed by myself:   EKG Interpretation  Date/Time:  Saturday April 20 2016 22:53:04 EST Ventricular Rate:  81 PR Interval:  142 QRS Duration: 80 QT Interval:  368 QTC Calculation: 427 R Axis:   4 Text Interpretation:  Normal sinus rhythm with sinus arrhythmia Normal ECG Rate is slower Confirmed by Jakiera Ehler  MD, Jonny Ruiz ([id number]) on 04/20/2016 10:57:52 PM      Laboratory Studies: Results for orders  placed or performed during the hospital encounter of 04/21/16 (from the past 24 hour(s))  Basic metabolic panel     Status: Abnormal   Collection Time: 04/20/16 10:56 PM  Result Value Ref Range   Sodium 135 135 - 145 mmol/L   Potassium 3.6 3.5 - 5.1 mmol/L   Chloride 106 101 - 111 mmol/L   CO2 24 22 - 32 mmol/L   Glucose, Bld 111 (H) 65 - 99 mg/dL   BUN 11 6 - 20 mg/dL   Creatinine, Ser 6.04 0.44 - 1.00 mg/dL   Calcium 8.9 8.9 - 54.0 mg/dL   GFR calc non Af Amer >60 >60 mL/min   GFR calc Af Amer >60 >60 mL/min   Anion gap 5 5 - 15  CBC     Status: Abnormal   Collection Time: 04/20/16 10:56 PM  Result Value Ref Range   WBC 6.6 4.0 - 10.5 K/uL   RBC 4.14 3.87 - 5.11 MIL/uL   Hemoglobin 12.1 12.0 - 15.0 g/dL   HCT 98.1 (L) 19.1 - 47.8 %   MCV 84.1 78.0 - 100.0 fL   MCH 29.2 26.0 - 34.0 pg   MCHC 34.8 30.0 - 36.0 g/dL   RDW 29.5 62.1 - 30.8 %   Platelets 289 150 - 400 K/uL  Troponin I     Status: None   Collection Time: 04/20/16 10:56 PM  Result Value Ref Range   Troponin I <0.03 <0.03 ng/mL  Urinalysis, Routine w reflex microscopic     Status: Abnormal   Collection Time: 04/20/16 11:33 PM  Result Value Ref Range   Color, Urine YELLOW YELLOW   APPearance CLEAR CLEAR   Specific Gravity, Urine 1.013 1.005 - 1.030   pH 6.0 5.0 - 8.0   Glucose, UA NEGATIVE NEGATIVE mg/dL   Hgb urine dipstick SMALL (A) NEGATIVE   Bilirubin Urine NEGATIVE NEGATIVE   Ketones, ur NEGATIVE NEGATIVE mg/dL   Protein, ur NEGATIVE NEGATIVE mg/dL   Nitrite NEGATIVE NEGATIVE   Leukocytes, UA NEGATIVE NEGATIVE  Urinalysis, Microscopic (reflex)     Status: Abnormal   Collection Time: 04/20/16 11:33 PM  Result Value Ref Range   RBC / HPF 0-5 0 - 5 RBC/hpf   WBC, UA 0-5 0 - 5 WBC/hpf   Bacteria, UA RARE (A) NONE SEEN   Squamous Epithelial / LPF 6-30 (A) NONE SEEN   Imaging Studies: Dg Chest 2 View  Result Date: 04/20/2016 CLINICAL DATA:  46 year old female with chest pain and dizziness. EXAM: CHEST   2 VIEW COMPARISON:  Chest radiograph dated 05/08/2015 FINDINGS: The heart size and mediastinal contours are within normal limits. Both lungs are clear. The visualized skeletal structures are unremarkable. IMPRESSION: No active cardiopulmonary disease. Electronically Signed   By: Elgie Collard M.D.   On: 04/20/2016 23:56    ED COURSE  Nursing notes and initial vitals signs, including pulse oximetry, reviewed.  Vitals:   04/21/16 0200 04/21/16 0215 04/21/16 0230 04/21/16 0243  BP: 102/76 91/64  96/58 96/58  Pulse: 66 66 69 70  Resp: 25 19 19 20   Temp:    98 F (36.7 C)  TempSrc:    Oral  SpO2: 98% 97% 99% 98%  Weight:      Height:        PROCEDURES    ED DIAGNOSES     ICD-9-CM ICD-10-CM   1. Abdominal cramping 789.00 R10.9   2. Medication refill V68.1 Z76.0        Paula Libra, MD 04/21/16 [phone]

## 2016-04-24 ENCOUNTER — Ambulatory Visit (INDEPENDENT_AMBULATORY_CARE_PROVIDER_SITE_OTHER): Payer: Self-pay | Admitting: Infectious Disease

## 2016-04-24 ENCOUNTER — Encounter: Payer: Self-pay | Admitting: Infectious Disease

## 2016-04-24 VITALS — BP 111/72 | HR 88 | Temp 98.2°F | Ht 67.0 in | Wt 185.0 lb

## 2016-04-24 DIAGNOSIS — Z23 Encounter for immunization: Secondary | ICD-10-CM

## 2016-04-24 DIAGNOSIS — B20 Human immunodeficiency virus [HIV] disease: Secondary | ICD-10-CM

## 2016-04-24 DIAGNOSIS — F2089 Other schizophrenia: Secondary | ICD-10-CM

## 2016-04-24 DIAGNOSIS — B009 Herpesviral infection, unspecified: Secondary | ICD-10-CM

## 2016-04-24 MED ORDER — MENINGOCOCCAL A C Y&W-135 OLIG IM SOLR
0.5000 mL | Freq: Once | INTRAMUSCULAR | Status: AC
  Administered 2016-04-24: 0.5 mL via INTRAMUSCULAR

## 2016-04-24 NOTE — Progress Notes (Signed)
HPI: Adriana Fernandez is a 46 y.o. female who presents to the [facility] clinic today to follow-up with Dr. Daiva EvesVan Dam for her HIV infection.  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  Allergies: Allergies  Allergen Reactions  . Latex     Past Medical History: Past Medical History:  Diagnosis Date  . Depression   . HIV disease (HCC)   . 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 Hypertension   . Paranoid schizophrenia (HCC)   . Psychosis   . Schizophrenia (HCC)   . Sickle cell trait (HCC)     Social History: Social History   Social History  . Marital status: Divorced    Spouse name: N/A  . Number of children: N/A  . Years of education: N/A   Social History Main Topics  . Smoking status: Never Smoker  . Smokeless tobacco: Never Used  . Alcohol use No  . Drug use: No  . Sexual activity: Yes    Partners: Male    Birth control/ protection: IUD   Other Topics Concern  . None   Social History Narrative  . None    Current Regimen: None  Labs: HIV 1 RNA Quant (copies/mL)  Date Value  04/11/2016 <20 NOT DETECTED  09/29/2015 <20  09/14/2014 <20   HIV-1 RNA Viral Load (no units)  Date Value  06/26/2011 <40   CD4 T Cell Abs (/uL)  Date Value  04/11/2016 800  09/29/2015 720  07/26/2014 1,130   Hep B S Ab (no units)  Date Value  04/21/2006 NO   Hepatitis B Surface Ag (no units)  Date Value  06/04/2011 NEGATIVE   HCV Ab (no units)  Date Value  06/04/2011 NEGATIVE    CrCl: Estimated Creatinine Clearance: 100.9 mL/min (by C-G formula based on SCr of 0.77 mg/dL).  Lipids:    Component Value Date/Time   CHOL 140 04/11/2016 1528   TRIG 87 04/11/2016 1528   HDL 35 (L) 04/11/2016 1528   CHOLHDL 4.0 04/11/2016 1528   VLDL 17 04/11/2016 1528   LDLCALC 88 04/11/2016 1528    Assessment: Adriana Fernandez is here today for HIV follow-up.  She was diagnosed with HIV in 1992 and  has never been on medications as she is an elite controller and her viral load is <20.  Dr. Daiva EvesVan Dam asked me to speak with her about possibly starting medications.  I discussed several different treatment options with Adriana Fernandez. She was interested in a STR 1 pill once daily.  She was also interested in not taking it with food.  I discussed Triumeq and Biktarvy with her.  I discussed benefits of being on medication even though she is an elite controller such as decreasing long term side effects associated with HIV and HIV related inflammation, especially in elite controllers as it is believed that their body's immune system is working overtime to control the HIV and thus can cause more systemic inflammation. After discussion, she decided she would try Biktarvy.  It is not yet on ADAP formulary, so I will bring her back in 1 month to start it then.  Plans: - F/u with [facility] pharmacy 4/2 at 9:30am - Make appt with Dr. Daiva EvesVan Dam for 5 months at this visit  Brien Lowe L. Derry Kassel, PharmD, CPP Infectious Diseases Clinical Pharmacist [facility] 04/24/2016, 9:40 AM

## 2016-04-24 NOTE — Progress Notes (Signed)
Chief compliant: followup for HIV not on meds (an "elite controller")   Subjective:    Patient ID: Adriana Fernandez, female    DOB: Nov 22, 1970, 46 y.o.   MRN: [id number]  HPI  46   year old [place] lady with HIV who is an ELITE controller with undetectable viral load and healthy CD4 count since 1992.   She was enrolled in [facility] trial for this but then could not stay in trial due to drugs she needed for her schizophrenia..  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 She is now on valtrex 1 gram daily.  Her schizophrenia is well controlled.  We discussed results of the [phone] [facility] which showed benefit in terms of reduced inflammation and single copies of HIV RNAwith ARV (see our poster at [facility] from [facility]).  Past Medical History:  Diagnosis Date  . Depression   . HIV disease (HCC)   . 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 Hypertension   . Paranoid schizophrenia (HCC)   . Psychosis   . Schizophrenia (HCC)   . Sickle cell trait (HCC)     No past surgical history on file.  Family History  Problem Relation Age of Onset  . GER disease Mother   . Hypertension Mother   . Sickle cell anemia Brother   . Diabetes Maternal Aunt   . Diabetes Maternal Grandmother   . Breast cancer Maternal Aunt   . Throat cancer Paternal Grandmother       Social History   Social History  . Marital status: Divorced    Spouse name: N/A  . Number of children: N/A  . Years of education: N/A   Social History Main Topics  . Smoking status: Never Smoker  . Smokeless tobacco: Never Used  . Alcohol use No  . Drug use: No  . Sexual activity: Yes    Partners: Male    Birth control/ protection: IUD   Other Topics Concern  . None   Social History Narrative  . None    Allergies  Allergen  Reactions  . Latex      Current Outpatient Prescriptions:  .  amLODipine (NORVASC) 5 MG tablet, Take 1 tablet (5 mg total) by mouth daily., Disp: 30 tablet, Rfl: 0 .  buPROPion (WELLBUTRIN SR) 150 MG 12 hr tablet, Take 1 tablet (150 mg total) by mouth 2 (two) times daily., Disp: 60 tablet, Rfl: 0 .  hydrochlorothiazide (HYDRODIURIL) 25 MG tablet, Take 1 tablet (25 mg total) by mouth daily., Disp: 30 tablet, Rfl: 0 .  paliperidone (INVEGA) 3 MG 24 hr tablet, Take 1.5 mg by mouth daily. , Disp: , Rfl:  .  valACYclovir (VALTREX) 1000 MG tablet, Take 1,000 mg by mouth daily., Disp: , Rfl:     Review of Systems  Constitutional: Negative for activity change, appetite change, chills, diaphoresis, fatigue, fever and unexpected weight change.  HENT: Negative for congestion, rhinorrhea, sinus pressure, sneezing, sore throat and trouble swallowing.   Eyes: Negative for photophobia and visual disturbance.  Respiratory: Negative for cough, chest tightness, shortness of breath, wheezing and stridor.   Cardiovascular: Negative for chest pain, palpitations and leg swelling.  Gastrointestinal: Negative for abdominal distention, abdominal pain, anal bleeding, blood in stool, constipation, diarrhea, nausea and vomiting.  Genitourinary: Negative for difficulty  urinating, dysuria, flank pain and hematuria.  Musculoskeletal: Negative for arthralgias, back pain, gait problem, joint swelling and myalgias.  Skin: Negative for color change, pallor, rash and wound.  Neurological: Negative for dizziness, tremors, weakness and light-headedness.  Hematological: Negative for adenopathy. Does not bruise/bleed easily.  Psychiatric/Behavioral: Negative for agitation, behavioral problems, confusion, decreased concentration, dysphoric mood and sleep disturbance.       Objective:   Physical Exam  Constitutional: She is oriented to person, place, and time. She appears well-developed and well-nourished. No distress.   HENT:  Head: Normocephalic and atraumatic.  Mouth/Throat: Oropharynx is clear and moist. No oropharyngeal exudate.  Eyes: Conjunctivae and EOM are normal. Pupils are equal, round, and reactive to light. No scleral icterus.  Neck: Normal range of motion. Neck supple. No JVD present.  Cardiovascular: Normal rate, regular rhythm and normal heart sounds.  Exam reveals no gallop and no friction rub.   No murmur heard. Pulmonary/Chest: Effort normal and breath sounds normal. No respiratory distress. She has no wheezes. She has no rales. She exhibits no tenderness.  Abdominal: She exhibits no distension and no mass. There is no tenderness. There is no rebound and no guarding.  Musculoskeletal: She exhibits no edema or tenderness.  Lymphadenopathy:    She has no cervical adenopathy.  Neurological: She is alert and oriented to person, place, and time. She has normal reflexes. She exhibits normal muscle tone. Coordination normal.  Skin: Skin is warm and dry. She is not diaphoretic. No erythema. No pallor.  Psychiatric: She has a normal mood and affect. Judgment and thought content normal.          Assessment & Plan:  HIV: elite controller. There is evidence that pts who are elite controllers may have more systemic inflammation thought to be due to ongoing suppression of HIV virus by immune system.  I had further extensive discussion re ARV and later Aggie Cosier from Pharmacy. I believe the patient is now strongly considering BIKTARVY STR, ADAP renewed today and to also renew in Jul  Schizopherenia; well controlled   HSV1 and 2: on valtrex for prophylaxis  I spent greater than 40 minutes with the patient including greater than 50% of time in face to face counsel of the patient re her HIV her Elite controller status, evidence for benefit from meds and re her schizophrenia. and in coordination of her care.

## 2016-05-04 ENCOUNTER — Emergency Department (HOSPITAL_BASED_OUTPATIENT_CLINIC_OR_DEPARTMENT_OTHER)
Admission: EM | Admit: 2016-05-04 | Discharge: 2016-05-04 | Disposition: A | Discharge status: Home / Self Care | Payer: Self-pay | Attending: Emergency Medicine | Admitting: Emergency Medicine

## 2016-05-04 ENCOUNTER — Emergency Department (HOSPITAL_BASED_OUTPATIENT_CLINIC_OR_DEPARTMENT_OTHER): Payer: Self-pay

## 2016-05-04 DIAGNOSIS — Z21 Asymptomatic human immunodeficiency virus [HIV] infection status: Secondary | ICD-10-CM | POA: Insufficient documentation

## 2016-05-04 DIAGNOSIS — R945 Abnormal results of liver function studies: Secondary | ICD-10-CM | POA: Insufficient documentation

## 2016-05-04 DIAGNOSIS — Z79899 Other long term (current) drug therapy: Secondary | ICD-10-CM | POA: Insufficient documentation

## 2016-05-04 DIAGNOSIS — Z91040 Latex allergy status: Secondary | ICD-10-CM | POA: Insufficient documentation

## 2016-05-04 DIAGNOSIS — R748 Abnormal levels of other serum enzymes: Secondary | ICD-10-CM

## 2016-05-04 DIAGNOSIS — K8020 Calculus of gallbladder without cholecystitis without obstruction: Secondary | ICD-10-CM | POA: Insufficient documentation

## 2016-05-04 DIAGNOSIS — I10 Essential (primary) hypertension: Secondary | ICD-10-CM | POA: Insufficient documentation

## 2016-05-04 LAB — URINALYSIS, ROUTINE W REFLEX MICROSCOPIC
Bilirubin Urine: NEGATIVE
Glucose, UA: NEGATIVE mg/dL
Ketones, ur: NEGATIVE mg/dL
Nitrite: NEGATIVE
Protein, ur: NEGATIVE mg/dL
Specific Gravity, Urine: 1.013 (ref 1.005–1.030)
pH: 5.5 (ref 5.0–8.0)

## 2016-05-04 LAB — CBC
HCT: 36.1 % (ref 36.0–46.0)
Hemoglobin: 12.7 g/dL (ref 12.0–15.0)
MCH: 29.3 pg (ref 26.0–34.0)
MCHC: 35.2 g/dL (ref 30.0–36.0)
MCV: 83.2 fL (ref 78.0–100.0)
Platelets: 285 10*3/uL (ref 150–400)
RBC: 4.34 MIL/uL (ref 3.87–5.11)
RDW: 14 % (ref 11.5–15.5)
WBC: 7.3 10*3/uL (ref 4.0–10.5)

## 2016-05-04 LAB — COMPREHENSIVE METABOLIC PANEL
ALT: 110 U/L — ABNORMAL HIGH (ref 14–54)
AST: 154 U/L — ABNORMAL HIGH (ref 15–41)
Albumin: 3.8 g/dL (ref 3.5–5.0)
Alkaline Phosphatase: 106 U/L (ref 38–126)
Anion gap: 7 (ref 5–15)
BUN: 10 mg/dL (ref 6–20)
CO2: 21 mmol/L — ABNORMAL LOW (ref 22–32)
Calcium: 8.7 mg/dL — ABNORMAL LOW (ref 8.9–10.3)
Chloride: 108 mmol/L (ref 101–111)
Creatinine, Ser: 0.76 mg/dL (ref 0.44–1.00)
GFR calc Af Amer: >60 mL/min (ref >60)
GFR calc non Af Amer: >60 mL/min (ref >60)
Glucose, Bld: 114 mg/dL — ABNORMAL HIGH (ref 65–99)
Potassium: 4 mmol/L (ref 3.5–5.1)
Sodium: 136 mmol/L (ref 135–145)
Total Bilirubin: 1.1 mg/dL (ref 0.3–1.2)
Total Protein: 7.7 g/dL (ref 6.5–8.1)

## 2016-05-04 LAB — URINALYSIS, MICROSCOPIC (REFLEX)

## 2016-05-04 LAB — LIPASE, BLOOD: Lipase: 23 U/L (ref 11–51)

## 2016-05-04 LAB — PREGNANCY, URINE: Preg Test, Ur: NEGATIVE

## 2016-05-04 MED ORDER — IBUPROFEN 600 MG PO TABS: 600.0000 mg | ORAL_TABLET | Freq: Four times a day (QID) | ORAL | 0 refills | Status: DC | PRN

## 2016-05-04 MED ORDER — TRAMADOL HCL 50 MG PO TABS: 50.0000 mg | ORAL_TABLET | Freq: Four times a day (QID) | ORAL | 0 refills | Status: DC | PRN

## 2016-05-04 MED ORDER — ONDANSETRON HCL 4 MG/2ML IJ SOLN
4.0000 mg | Freq: Once | INTRAMUSCULAR | Status: AC | PRN
  Administered 2016-05-04: 4 mg via INTRAVENOUS
  Filled 2016-05-04: qty 2

## 2016-05-04 MED ORDER — KETOROLAC TROMETHAMINE 30 MG/ML IJ SOLN
30.0000 mg | Freq: Once | INTRAMUSCULAR | Status: AC
  Administered 2016-05-04: 30 mg via INTRAVENOUS
  Filled 2016-05-04: qty 1

## 2016-05-04 NOTE — ED Notes (Signed)
Pt given d/c instructions as per chart. Verbalizes understanding. No questions. 

## 2016-05-04 NOTE — ED Provider Notes (Signed)
[facility] Provider Note   CSN: [id number] Arrival date & time: 05/04/16  1547   By signing my name below, I, Adriana Fernandez, attest that this documentation has been prepared under the direction and in the presence of Adriana Bucco, MD. Electronically signed, Adriana Fernandez, ED Scribe. 05/04/16. 5:08 PM.   History   Chief Complaint Chief Complaint  Patient presents with  . Abdominal Pain   The history is provided by the patient and medical records. No language interpreter was used.    HPI Comments: Adriana Fernandez is a 46 y.o. female with hx of HIV, not on meds due to undetectable viral load and good CD4 counts who presents to the Emergency Department complaining of severe mid abdominal pain today. She notes associated N/V. Nursing staff notes pt seen in [facility] ED for the same 04/21/2016 and relieved without intervention after "a few hours". Pt denies suspicious food intake, dysuria, fever, chills, constipation and diarrhea.   Past Medical History:  Diagnosis Date  . Depression   . HIV disease (HCC)   . 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 Hypertension   . Paranoid schizophrenia (HCC)   . Psychosis   . Schizophrenia (HCC)   . Sickle cell trait [facility])     Patient Active Problem List   Diagnosis Date Noted  . 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 Paranoid schizophrenia (HCC)   . Sickle cell trait (HCC)   . Schizophrenia (HCC) 12/09/2011  . Epistaxis 05/07/2011  . HYPERPROLACTINEMIA 05/10/2009  . NIPPLE DISCHARGE 05/05/2009  . Human immunodeficiency virus (HIV) disease (HCC) 12/27/2005  . DEPRESSION 12/27/2005    No past surgical history on file.  OB History    No data available       Home Medications    Prior to Admission medications   Medication Sig Start Date End Date Taking? Authorizing Provider    amLODipine (NORVASC) 5 MG tablet Take 1 tablet (5 mg total) by mouth daily. 04/21/16   [name], MD  buPROPion (WELLBUTRIN SR) 150 MG 12 hr tablet Take 1 tablet (150 mg total) by mouth 2 (two) times daily. 06/24/12   Randall Hiss, MD  hydrochlorothiazide (HYDRODIURIL) 25 MG tablet Take 1 tablet (25 mg total) by mouth daily. 04/21/16   [name], MD  ibuprofen (ADVIL,MOTRIN) 600 MG tablet Take 1 tablet (600 mg total) by mouth every 6 (six) hours as needed. 05/04/16   Adriana Bucco, MD  paliperidone (INVEGA) 3 MG 24 hr tablet Take 1.5 mg by mouth daily.     Historical Provider, MD  traMADol (ULTRAM) 50 MG tablet Take 1 tablet (50 mg total) by mouth every 6 (six) hours as needed. 05/04/16   Adriana Bucco, MD  valACYclovir (VALTREX) 1000 MG tablet Take 1,000 mg by mouth daily.    Historical Provider, MD    Family History Family History  Problem Relation Age of Onset  . GER disease Mother   . Hypertension Mother   . Sickle cell anemia Brother   . Diabetes Maternal Aunt   . Diabetes Maternal Grandmother   . Breast cancer Maternal Aunt   . Throat cancer Paternal Grandmother     Social History Social History  Substance Use Topics  . Smoking status: Never Smoker  . Smokeless tobacco: Never Used  . Alcohol use No     Allergies  Latex   Review of Systems Review of Systems  Constitutional: Negative for chills, diaphoresis, fatigue and fever.  HENT: Negative for congestion, rhinorrhea and sneezing.   Eyes: Negative.   Respiratory: Negative for cough, chest tightness and shortness of breath.   Cardiovascular: Negative for chest pain and leg swelling.  Gastrointestinal: Positive for abdominal pain, nausea and vomiting. Negative for blood in stool and diarrhea.  Genitourinary: Negative for difficulty urinating, flank pain, frequency and hematuria.  Musculoskeletal: Negative for arthralgias and back pain.  Skin: Negative for rash.  Neurological: Negative for dizziness, speech  difficulty, weakness, numbness and headaches.     Physical Exam Updated Vital Signs BP 129/83   Pulse 107   Temp 99.1 F (37.3 C) (Oral)   Resp 18   Ht 5\' 7"  (1.702 m)   Wt 185 lb (83.9 kg)   SpO2 99%   BMI 28.98 kg/m   Physical Exam  Constitutional: She is oriented to person, place, and time. She appears well-developed and well-nourished.  HENT:  Head: Normocephalic and atraumatic.  Eyes: Pupils are equal, round, and reactive to light.  Neck: Normal range of motion. Neck supple.  Cardiovascular: Normal rate, regular rhythm and normal heart sounds.   Pulmonary/Chest: Effort normal and breath sounds normal. No respiratory distress. She has no wheezes. She has no rales. She exhibits no tenderness.  Abdominal: Soft. Bowel sounds are normal. There is tenderness in the periumbilical area. There is no rebound and no guarding.  Musculoskeletal: Normal range of motion. She exhibits no edema.  Lymphadenopathy:    She has no cervical adenopathy.  Neurological: She is alert and oriented to person, place, and time.  Skin: Skin is warm and dry. No rash noted.  Psychiatric: She has a normal mood and affect.     ED Treatments / Results  DIAGNOSTIC STUDIES: Oxygen Saturation is 99% on RA, normal by my interpretation.    COORDINATION OF CARE: 5:06 PM Discussed treatment plan with pt at bedside and pt agreed to plan. Will order imaging, labs and medications.  Labs (all labs ordered are listed, but only abnormal results are displayed) Results for orders placed or performed during the hospital encounter of 05/04/16  Lipase, blood  Result Value Ref Range   Lipase 23 11 - 51 U/L  Comprehensive metabolic panel  Result Value Ref Range   Sodium 136 135 - 145 mmol/L   Potassium 4.0 3.5 - 5.1 mmol/L   Chloride 108 101 - 111 mmol/L   CO2 21 (L) 22 - 32 mmol/L   Glucose, Bld 114 (H) 65 - 99 mg/dL   BUN 10 6 - 20 mg/dL   Creatinine, Ser 4.09 0.44 - 1.00 mg/dL   Calcium 8.7 (L) 8.9 - 10.3  mg/dL   Total Protein 7.7 6.5 - 8.1 g/dL   Albumin 3.8 3.5 - 5.0 g/dL   AST 811 (H) 15 - 41 U/L   ALT 110 (H) 14 - 54 U/L   Alkaline Phosphatase 106 38 - 126 U/L   Total Bilirubin 1.1 0.3 - 1.2 mg/dL   GFR calc non Af Amer >60 >60 mL/min   GFR calc Af Amer >60 >60 mL/min   Anion gap 7 5 - 15  CBC  Result Value Ref Range   WBC 7.3 4.0 - 10.5 K/uL   RBC 4.34 3.87 - 5.11 MIL/uL   Hemoglobin 12.7 12.0 - 15.0 g/dL   HCT 91.4 78.2 - 95.6 %   MCV 83.2 78.0 - 100.0 fL  MCH 29.3 26.0 - 34.0 pg   MCHC 35.2 30.0 - 36.0 g/dL   RDW 16.114.0 09.611.5 - 04.515.5 %   Platelets 285 150 - 400 K/uL  Urinalysis, Routine w reflex microscopic  Result Value Ref Range   Color, Urine AMBER (A) YELLOW   APPearance CLOUDY (A) CLEAR   Specific Gravity, Urine 1.013 1.005 - 1.030   pH 5.5 5.0 - 8.0   Glucose, UA NEGATIVE NEGATIVE mg/dL   Hgb urine dipstick SMALL (A) NEGATIVE   Bilirubin Urine NEGATIVE NEGATIVE   Ketones, ur NEGATIVE NEGATIVE mg/dL   Protein, ur NEGATIVE NEGATIVE mg/dL   Nitrite NEGATIVE NEGATIVE   Leukocytes, UA MODERATE (A) NEGATIVE  Pregnancy, urine  Result Value Ref Range   Preg Test, Ur NEGATIVE NEGATIVE  Urinalysis, Microscopic (reflex)  Result Value Ref Range   RBC / HPF 6-30 0 - 5 RBC/hpf   WBC, UA 6-30 0 - 5 WBC/hpf   Bacteria, UA MANY (A) NONE SEEN   Squamous Epithelial / LPF 0-5 (A) NONE SEEN   Mucous PRESENT    Dg Chest 2 View  Result Date: 04/20/2016 CLINICAL DATA:  46 year old female with chest pain and dizziness. EXAM: CHEST  2 VIEW COMPARISON:  Chest radiograph dated 05/08/2015 FINDINGS: The heart size and mediastinal contours are within normal limits. Both lungs are clear. The visualized skeletal structures are unremarkable. IMPRESSION: No active cardiopulmonary disease. Electronically Signed   By: Elgie CollardArash  Radparvar M.D.   On: 04/20/2016 23:56   [place] Abdomen Complete  Result Date: 05/04/2016 CLINICAL DATA:  Right upper quadrant pain x1 day, nausea/vomiting EXAM: ABDOMEN  ULTRASOUND COMPLETE COMPARISON:  None. FINDINGS: Gallbladder: Layering gallstones in the gallbladder fundus, measuring up to 9 mm. No gallbladder wall thickening or pericholecystic fluid. Negative sonographic Murphy's sign. Common bile duct: Diameter: 3 mm Liver: At the upper limits of normal for parenchymal echogenicity. 8 x 8 x 7 mm hyperechoic lesion in the central right hepatic lobe (image 84), likely reflecting a benign hemangioma. IVC: No abnormality visualized. Pancreas: Incompletely visualized due to overlying bowel gas. Spleen: Size and appearance within normal limits. Right Kidney: Length: 12.2 cm. 9 x 6 x 9 mm upper pole cyst. No hydronephrosis. Left Kidney: Length: 12.0 cm.  No mass or hydronephrosis Abdominal aorta: No aneurysm visualized. Other findings: None. IMPRESSION: Cholelithiasis, without associated sonographic findings to suggest acute cholecystitis. Suspected 8 mm benign hemangioma in the right hepatic lobe. 9 mm simple right upper pole renal cyst. Electronically Signed   By: Charline BillsSriyesh  Krishnan M.D.   On: 05/04/2016 19:04   Dg Abd Acute W/chest  Result Date: 05/04/2016 CLINICAL DATA:  Abdominal pain and cramping. EXAM: DG ABDOMEN ACUTE W/ 1V CHEST COMPARISON:  Chest x-ray 04/20/2016. FINDINGS: The lungs are clear wiithout focal pneumonia, edema, pneumothorax or pleural effusion. The cardiopericardial silhouette is within normal limits for size. The visualized bony structures of the thorax are intact. Upright film shows no evidence for intraperitoneal free air. There is no evidence for gaseous bowel dilation to suggest obstruction. IUD projects over the central pelvis. Visualized bony anatomy unremarkable. IMPRESSION: 1. No acute cardiopulmonary findings. 2. No evidence for bowel obstruction or perforation. Electronically Signed   By: Kennith CenterEric  Mansell M.D.   On: 05/04/2016 18:30     EKG  EKG Interpretation None       Radiology [place] Abdomen Complete  Result Date: 05/04/2016 CLINICAL  DATA:  Right upper quadrant pain x1 day, nausea/vomiting EXAM: ABDOMEN ULTRASOUND COMPLETE COMPARISON:  None. FINDINGS: Gallbladder: Layering gallstones  in the gallbladder fundus, measuring up to 9 mm. No gallbladder wall thickening or pericholecystic fluid. Negative sonographic Murphy's sign. Common bile duct: Diameter: 3 mm Liver: At the upper limits of normal for parenchymal echogenicity. 8 x 8 x 7 mm hyperechoic lesion in the central right hepatic lobe (image 84), likely reflecting a benign hemangioma. IVC: No abnormality visualized. Pancreas: Incompletely visualized due to overlying bowel gas. Spleen: Size and appearance within normal limits. Right Kidney: Length: 12.2 cm. 9 x 6 x 9 mm upper pole cyst. No hydronephrosis. Left Kidney: Length: 12.0 cm.  No mass or hydronephrosis Abdominal aorta: No aneurysm visualized. Other findings: None. IMPRESSION: Cholelithiasis, without associated sonographic findings to suggest acute cholecystitis. Suspected 8 mm benign hemangioma in the right hepatic lobe. 9 mm simple right upper pole renal cyst. Electronically Signed   By: Charline Bills M.D.   On: 05/04/2016 19:04   Dg Abd Acute W/chest  Result Date: 05/04/2016 CLINICAL DATA:  Abdominal pain and cramping. EXAM: DG ABDOMEN ACUTE W/ 1V CHEST COMPARISON:  Chest x-ray 04/20/2016. FINDINGS: The lungs are clear wiithout focal pneumonia, edema, pneumothorax or pleural effusion. The cardiopericardial silhouette is within normal limits for size. The visualized bony structures of the thorax are intact. Upright film shows no evidence for intraperitoneal free air. There is no evidence for gaseous bowel dilation to suggest obstruction. IUD projects over the central pelvis. Visualized bony anatomy unremarkable. IMPRESSION: 1. No acute cardiopulmonary findings. 2. No evidence for bowel obstruction or perforation. Electronically Signed   By: Kennith Center M.D.   On: 05/04/2016 18:30    Procedures Procedures (including  critical care time)  Medications Ordered in ED Medications  ondansetron (ZOFRAN) injection 4 mg (4 mg Intravenous Given 05/04/16 1708)  ketorolac (TORADOL) 30 MG/ML injection 30 mg (30 mg Intravenous Given 05/04/16 2026)     Initial Impression / Assessment and Plan / ED Course  I have reviewed the triage vital signs and the nursing notes.  Pertinent labs & imaging results that were available during my care of the patient were reviewed by me and considered in my medical decision making (see chart for details).   patient presents with an umbilical abdominal pain. She has evidence of gallstones on ultrasound. Her LFTs are mildly elevated. She has no evidence of pancreatitis. No evidence of cholecystitis. No fever or elevated white count. Her pain is controlled in the ED. She had some bacteria and white cells in her urine but she denies urinary symptoms. I spoke with Dr. Carolynne Edouard with general surgery who feels the patient can be discharged home with outpatient surgery follow-up. She was okay with this plan. She will call Monday to get an appointment with [facility] surgery. She was given prescriptions for ibuprofen and tramadol. She was advised to maintain a strict nonfat diet. Strict return precautions were given.  I personally performed the services described in this documentation, which was scribed in my presence.  The recorded information has been reviewed and considered.   Final Clinical Impressions(s) / ED Diagnoses   Final diagnoses:  Gallstones  Elevated liver enzymes    New Prescriptions Discharge Medication List as of 05/04/2016  8:04 PM    START taking these medications   Details  ibuprofen (ADVIL,MOTRIN) 600 MG tablet Take 1 tablet (600 mg total) by mouth every 6 (six) hours as needed., Starting Sat 05/04/2016, Print    traMADol (ULTRAM) 50 MG tablet Take 1 tablet (50 mg total) by mouth every 6 (six) hours as needed., Starting  Sat 05/04/2016, Print         Adriana Bucco,  MD 05/04/16 2209

## 2016-05-04 NOTE — ED Notes (Signed)
Patient transported to X-ray 

## 2016-05-04 NOTE — ED Notes (Signed)
Pt seen here at the end of February for same s/s. Today states she ate some noodles and about 1-2 hrs later began vomiting. Vomited x 6 since. Abd soft. Tender RUQ, LUQ. Denies urinary s/s.

## 2016-05-04 NOTE — ED Triage Notes (Signed)
abd pain after eating noodles. States she has slight  n/v

## 2016-05-16 ENCOUNTER — Encounter: Payer: Self-pay | Admitting: Infectious Disease

## 2016-05-27 ENCOUNTER — Ambulatory Visit: Payer: Self-pay

## 2016-06-12 ENCOUNTER — Encounter (HOSPITAL_BASED_OUTPATIENT_CLINIC_OR_DEPARTMENT_OTHER): Payer: Self-pay | Admitting: Emergency Medicine

## 2016-06-12 DIAGNOSIS — R945 Abnormal results of liver function studies: Secondary | ICD-10-CM | POA: Insufficient documentation

## 2016-06-12 DIAGNOSIS — Z79899 Other long term (current) drug therapy: Secondary | ICD-10-CM | POA: Insufficient documentation

## 2016-06-12 DIAGNOSIS — R112 Nausea with vomiting, unspecified: Secondary | ICD-10-CM | POA: Insufficient documentation

## 2016-06-12 DIAGNOSIS — Z91040 Latex allergy status: Secondary | ICD-10-CM | POA: Insufficient documentation

## 2016-06-12 DIAGNOSIS — I10 Essential (primary) hypertension: Secondary | ICD-10-CM | POA: Insufficient documentation

## 2016-06-12 MED ORDER — ONDANSETRON 4 MG PO TBDP
4.0000 mg | ORAL_TABLET | Freq: Once | ORAL | Status: AC
  Administered 2016-06-13: 4 mg via ORAL
  Filled 2016-06-12: qty 1

## 2016-06-12 NOTE — ED Triage Notes (Signed)
Pt presents to ED with c/o vomiting and abdominal pain that started tonight. Pt vomiting in triage. Pt reports she has vomited 6 times at home.

## 2016-06-13 ENCOUNTER — Emergency Department (HOSPITAL_BASED_OUTPATIENT_CLINIC_OR_DEPARTMENT_OTHER): Payer: Self-pay

## 2016-06-13 ENCOUNTER — Encounter (HOSPITAL_BASED_OUTPATIENT_CLINIC_OR_DEPARTMENT_OTHER): Payer: Self-pay | Admitting: Emergency Medicine

## 2016-06-13 ENCOUNTER — Emergency Department (HOSPITAL_BASED_OUTPATIENT_CLINIC_OR_DEPARTMENT_OTHER)
Admission: EM | Admit: 2016-06-13 | Discharge: 2016-06-13 | Disposition: A | Discharge status: Home / Self Care | Payer: Self-pay | Attending: Emergency Medicine | Admitting: Emergency Medicine

## 2016-06-13 DIAGNOSIS — R112 Nausea with vomiting, unspecified: Secondary | ICD-10-CM

## 2016-06-13 DIAGNOSIS — R7989 Other specified abnormal findings of blood chemistry: Secondary | ICD-10-CM

## 2016-06-13 DIAGNOSIS — R945 Abnormal results of liver function studies: Secondary | ICD-10-CM

## 2016-06-13 HISTORY — DX: Calculus of gallbladder without cholecystitis without obstruction: K80.20

## 2016-06-13 HISTORY — DX: Abnormal results of liver function studies: R94.5

## 2016-06-13 HISTORY — DX: Other specified abnormal findings of blood chemistry: R79.89

## 2016-06-13 LAB — CBC WITH DIFFERENTIAL/PLATELET
Basophils Absolute: 0 10*3/uL (ref 0.0–0.1)
Basophils Relative: 0 %
Eosinophils Absolute: 0 10*3/uL (ref 0.0–0.7)
Eosinophils Relative: 0 %
HCT: 39 % (ref 36.0–46.0)
Hemoglobin: 13.6 g/dL (ref 12.0–15.0)
Lymphocytes Relative: 10 %
Lymphs Abs: 0.8 10*3/uL (ref 0.7–4.0)
MCH: 29.4 pg (ref 26.0–34.0)
MCHC: 34.9 g/dL (ref 30.0–36.0)
MCV: 84.4 fL (ref 78.0–100.0)
Monocytes Absolute: 0.6 10*3/uL (ref 0.1–1.0)
Monocytes Relative: 7 %
Neutro Abs: 6.7 10*3/uL (ref 1.7–7.7)
Neutrophils Relative %: 83 %
Platelets: 195 10*3/uL (ref 150–400)
RBC: 4.62 MIL/uL (ref 3.87–5.11)
RDW: 13.5 % (ref 11.5–15.5)
WBC: 8.1 10*3/uL (ref 4.0–10.5)

## 2016-06-13 LAB — URINALYSIS, ROUTINE W REFLEX MICROSCOPIC
Bilirubin Urine: NEGATIVE
Glucose, UA: NEGATIVE mg/dL
Hgb urine dipstick: NEGATIVE
Ketones, ur: NEGATIVE mg/dL
Leukocytes, UA: NEGATIVE
Nitrite: NEGATIVE
Protein, ur: NEGATIVE mg/dL
Specific Gravity, Urine: 1.013 (ref 1.005–1.030)
pH: 7 (ref 5.0–8.0)

## 2016-06-13 LAB — COMPREHENSIVE METABOLIC PANEL
ALT: 81 U/L — ABNORMAL HIGH (ref 14–54)
AST: 109 U/L — ABNORMAL HIGH (ref 15–41)
Albumin: 4.1 g/dL (ref 3.5–5.0)
Alkaline Phosphatase: 94 U/L (ref 38–126)
Anion gap: 8 (ref 5–15)
BUN: 11 mg/dL (ref 6–20)
CO2: 27 mmol/L (ref 22–32)
Calcium: 9.2 mg/dL (ref 8.9–10.3)
Chloride: 99 mmol/L — ABNORMAL LOW (ref 101–111)
Creatinine, Ser: 0.92 mg/dL (ref 0.44–1.00)
GFR calc Af Amer: >60 mL/min (ref >60)
GFR calc non Af Amer: >60 mL/min (ref >60)
Glucose, Bld: 147 mg/dL — ABNORMAL HIGH (ref 65–99)
Potassium: 4.1 mmol/L (ref 3.5–5.1)
Sodium: 134 mmol/L — ABNORMAL LOW (ref 135–145)
Total Bilirubin: 1.4 mg/dL — ABNORMAL HIGH (ref 0.3–1.2)
Total Protein: 8.1 g/dL (ref 6.5–8.1)

## 2016-06-13 LAB — RAPID URINE DRUG SCREEN, HOSP PERFORMED
Amphetamines: NOT DETECTED
Barbiturates: NOT DETECTED
Benzodiazepines: NOT DETECTED
Cocaine: NOT DETECTED
Opiates: NOT DETECTED
Tetrahydrocannabinol: NOT DETECTED

## 2016-06-13 LAB — LIPASE, BLOOD: Lipase: 19 U/L (ref 11–51)

## 2016-06-13 LAB — PREGNANCY, URINE: Preg Test, Ur: NEGATIVE

## 2016-06-13 MED ORDER — ONDANSETRON 8 MG PO TBDP: ORAL_TABLET | ORAL | 0 refills | Status: DC

## 2016-06-13 MED ORDER — ONDANSETRON HCL 4 MG/2ML IJ SOLN
4.0000 mg | Freq: Once | INTRAMUSCULAR | Status: AC
  Administered 2016-06-13: 4 mg via INTRAVENOUS
  Filled 2016-06-13: qty 2

## 2016-06-13 MED ORDER — KETOROLAC TROMETHAMINE 30 MG/ML IJ SOLN
30.0000 mg | Freq: Once | INTRAMUSCULAR | Status: AC
Start: 2016-06-13 — End: 2016-06-13
  Administered 2016-06-13: 30 mg via INTRAVENOUS
  Filled 2016-06-13: qty 1

## 2016-06-13 MED ORDER — SODIUM CHLORIDE 0.9 % IV BOLUS (SEPSIS)
500.0000 mL | Freq: Once | INTRAVENOUS | Status: AC
  Administered 2016-06-13: 500 mL via INTRAVENOUS

## 2016-06-13 MED ORDER — ONDANSETRON 4 MG PO TBDP
4.0000 mg | ORAL_TABLET | Freq: Once | ORAL | Status: AC
  Administered 2016-06-13: 4 mg via ORAL
  Filled 2016-06-13: qty 1

## 2016-06-13 MED ORDER — DICYCLOMINE HCL 20 MG PO TABS: 20.0000 mg | ORAL_TABLET | Freq: Two times a day (BID) | ORAL | 0 refills | Status: DC

## 2016-06-13 MED ORDER — DICYCLOMINE HCL 10 MG/ML IM SOLN
20.0000 mg | Freq: Once | INTRAMUSCULAR | Status: AC
  Administered 2016-06-13: 20 mg via INTRAMUSCULAR
  Filled 2016-06-13: qty 2

## 2016-06-13 NOTE — ED Provider Notes (Signed)
[facility]   CSN: [id number] Arrival date & time: 06/12/16  2351     History   Chief Complaint Chief Complaint  Patient presents with  . Abdominal Pain  . Emesis    HPI Adriana Fernandez is a 46 y.o. female.  The history is provided by the patient.  Abdominal Pain   This is a new problem. The current episode started 6 to 12 hours ago. The problem occurs constantly. The problem has not changed since onset.The pain is associated with an unknown factor. Pain location: lower abdomen. Quality: asked in multiple ways patient is unable to specify. The pain is severe. Associated symptoms include nausea and vomiting. Pertinent negatives include anorexia, fever, belching, diarrhea, flatus, hematochezia, melena, constipation, dysuria, frequency, hematuria, headaches, arthralgias and myalgias. Nothing aggravates the symptoms. Nothing relieves the symptoms. Past workup includes ultrasound. Her past medical history is significant for gallstones.    Past Medical History:  Diagnosis Date  . Depression   . Elevated LFTs   . Gallstones   . HIV disease (HCC)   . 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 Hypertension   . Paranoid schizophrenia (HCC)   . Psychosis   . Schizophrenia (HCC)   . Sickle cell trait [facility])     Patient Active Problem List   Diagnosis Date Noted  . 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 Paranoid schizophrenia (HCC)   . Sickle cell trait (HCC)   . Schizophrenia (HCC) 12/09/2011  . Epistaxis 05/07/2011  . HYPERPROLACTINEMIA 05/10/2009  . NIPPLE DISCHARGE 05/05/2009  . Human immunodeficiency virus (HIV) disease (HCC) 12/27/2005  . DEPRESSION 12/27/2005    History reviewed. No pertinent surgical history.  OB History    No data available       Home Medications    Prior to Admission medications    Medication Sig Start Date End Date Taking? Authorizing Provider  amLODipine (NORVASC) 5 MG tablet Take 1 tablet (5 mg total) by mouth daily. 04/21/16   [name], MD  buPROPion (WELLBUTRIN SR) 150 MG 12 hr tablet Take 1 tablet (150 mg total) by mouth 2 (two) times daily. 06/24/12   Randall Hiss, MD  hydrochlorothiazide (HYDRODIURIL) 25 MG tablet Take 1 tablet (25 mg total) by mouth daily. 04/21/16   [name], MD  ibuprofen (ADVIL,MOTRIN) 600 MG tablet Take 1 tablet (600 mg total) by mouth every 6 (six) hours as needed. 05/04/16   [name], MD  paliperidone (INVEGA) 3 MG 24 hr tablet Take 1.5 mg by mouth daily.     Historical Provider, MD  traMADol (ULTRAM) 50 MG tablet Take 1 tablet (50 mg total) by mouth every 6 (six) hours as needed. 05/04/16   [name], MD  valACYclovir (VALTREX) 1000 MG tablet Take 1,000 mg by mouth daily.    Historical Provider, MD    Family History Family History  Problem Relation Age of Onset  . GER disease Mother   . Hypertension Mother   . Sickle cell anemia Brother   . Diabetes Maternal Aunt   . Diabetes Maternal Grandmother   . Breast cancer Maternal Aunt   . Throat cancer Paternal Grandmother     Social History Social History  Substance Use Topics  . Smoking status: Never Smoker  . Smokeless tobacco: Never Used  . Alcohol use No     Allergies  Latex   Review of Systems Review of Systems  Constitutional: Negative for fever.  Gastrointestinal: Positive for abdominal pain, nausea and vomiting. Negative for anorexia, constipation, diarrhea, flatus, hematochezia and melena.  Genitourinary: Negative for dysuria, frequency and hematuria.  Musculoskeletal: Negative for arthralgias and myalgias.  Neurological: Negative for headaches.  All other systems reviewed and are negative.    Physical Exam Updated Vital Signs BP 101/61 (BP Location: Right Arm)   Pulse 68   Temp 97.5 F (36.4 C) (Oral)   Resp 14   SpO2 100%    Physical Exam  Constitutional: She is oriented to person, place, and time. She appears well-developed and well-nourished. No distress.  HENT:  Head: Normocephalic and atraumatic.  Mouth/Throat: No oropharyngeal exudate.  Eyes: Conjunctivae and EOM are normal. Pupils are equal, round, and reactive to light.  Neck: Normal range of motion. Neck supple.  Cardiovascular: Normal rate, regular rhythm, normal heart sounds and intact distal pulses.   Pulmonary/Chest: Effort normal and breath sounds normal. She has no wheezes. She has no rales.  Abdominal: Soft. Bowel sounds are normal. She exhibits no distension and no mass. There is no tenderness. There is no rigidity, no rebound, no guarding, no CVA tenderness, no tenderness at McBurney's point and negative Murphy's sign.  Musculoskeletal: Normal range of motion.  Neurological: She is alert and oriented to person, place, and time. She displays normal reflexes.  Skin: Skin is warm and dry. Capillary refill takes less than 2 seconds.  Psychiatric: She has a normal mood and affect.     ED Treatments / Results   Vitals:   06/12/16 2358 06/13/16 0335  BP: 115/84 101/61  Pulse: 92 68  Resp: 18 14  Temp: 97.5 F (36.4 C)    Results for orders placed or performed during the hospital encounter of 06/13/16  Lipase, blood  Result Value Ref Range   Lipase 19 11 - 51 U/L  CBC with Differential  Result Value Ref Range   WBC 8.1 4.0 - 10.5 K/uL   RBC 4.62 3.87 - 5.11 MIL/uL   Hemoglobin 13.6 12.0 - 15.0 g/dL   HCT 16.1 09.6 - 04.5 %   MCV 84.4 78.0 - 100.0 fL   MCH 29.4 26.0 - 34.0 pg   MCHC 34.9 30.0 - 36.0 g/dL   RDW 40.9 81.1 - 91.4 %   Platelets 195 150 - 400 K/uL   Neutrophils Relative % 83 %   Neutro Abs 6.7 1.7 - 7.7 K/uL   Lymphocytes Relative 10 %   Lymphs Abs 0.8 0.7 - 4.0 K/uL   Monocytes Relative 7 %   Monocytes Absolute 0.6 0.1 - 1.0 K/uL   Eosinophils Relative 0 %   Eosinophils Absolute 0.0 0.0 - 0.7 K/uL   Basophils  Relative 0 %   Basophils Absolute 0.0 0.0 - 0.1 K/uL  Comprehensive metabolic panel  Result Value Ref Range   Sodium 134 (L) 135 - 145 mmol/L   Potassium 4.1 3.5 - 5.1 mmol/L   Chloride 99 (L) 101 - 111 mmol/L   CO2 27 22 - 32 mmol/L   Glucose, Bld 147 (H) 65 - 99 mg/dL   BUN 11 6 - 20 mg/dL   Creatinine, Ser 7.82 0.44 - 1.00 mg/dL   Calcium 9.2 8.9 - 95.6 mg/dL   Total Protein 8.1 6.5 - 8.1 g/dL   Albumin 4.1 3.5 - 5.0 g/dL   AST 213 (H) 15 - 41 U/L   ALT 81 (H) 14 - 54  U/L   Alkaline Phosphatase 94 38 - 126 U/L   Total Bilirubin 1.4 (H) 0.3 - 1.2 mg/dL   GFR calc non Af Amer >60 >60 mL/min   GFR calc Af Amer >60 >60 mL/min   Anion gap 8 5 - 15  Urinalysis, Routine w reflex microscopic  Result Value Ref Range   Color, Urine YELLOW YELLOW   APPearance CLEAR CLEAR   Specific Gravity, Urine 1.013 1.005 - 1.030   pH 7.0 5.0 - 8.0   Glucose, UA NEGATIVE NEGATIVE mg/dL   Hgb urine dipstick NEGATIVE NEGATIVE   Bilirubin Urine NEGATIVE NEGATIVE   Ketones, ur NEGATIVE NEGATIVE mg/dL   Protein, ur NEGATIVE NEGATIVE mg/dL   Nitrite NEGATIVE NEGATIVE   Leukocytes, UA NEGATIVE NEGATIVE  Pregnancy, urine  Result Value Ref Range   Preg Test, Ur NEGATIVE NEGATIVE  Rapid urine drug screen (hospital performed)  Result Value Ref Range   Opiates NONE DETECTED NONE DETECTED   Cocaine NONE DETECTED NONE DETECTED   Benzodiazepines NONE DETECTED NONE DETECTED   Amphetamines NONE DETECTED NONE DETECTED   Tetrahydrocannabinol NONE DETECTED NONE DETECTED   Barbiturates NONE DETECTED NONE DETECTED   Dg Abdomen Acute W/chest  Result Date: 06/13/2016 CLINICAL DATA:  Sudden onset of lower abdominal pain.  Vomiting. EXAM: DG ABDOMEN ACUTE W/ 1V CHEST COMPARISON:  Radiographs 05/04/2016 FINDINGS: The cardiomediastinal contours are normal. The lungs are clear. There is no free intra-abdominal air. No dilated bowel loops to suggest obstruction. Small-moderate volume of stool throughout the colon. No  radiopaque calculi. Right pelvic phleboliths again seen. IUD in the pelvis. No acute osseous abnormalities are seen. IMPRESSION: 1. Normal bowel gas pattern. No evidence of bowel obstruction or free air. 2. Clear lungs. Electronically Signed   By: Rubye Oaks M.D.   On: 06/13/2016 03:10    Radiology Dg Abdomen Acute W/chest  Result Date: 06/13/2016 CLINICAL DATA:  Sudden onset of lower abdominal pain.  Vomiting. EXAM: DG ABDOMEN ACUTE W/ 1V CHEST COMPARISON:  Radiographs 05/04/2016 FINDINGS: The cardiomediastinal contours are normal. The lungs are clear. There is no free intra-abdominal air. No dilated bowel loops to suggest obstruction. Small-moderate volume of stool throughout the colon. No radiopaque calculi. Right pelvic phleboliths again seen. IUD in the pelvis. No acute osseous abnormalities are seen. IMPRESSION: 1. Normal bowel gas pattern. No evidence of bowel obstruction or free air. 2. Clear lungs. Electronically Signed   By: Rubye Oaks M.D.   On: 06/13/2016 03:10    Procedures Procedures (including critical care time)  Medications Ordered in ED Medications  ondansetron (ZOFRAN-ODT) disintegrating tablet 4 mg (4 mg Oral Given 06/13/16 0002)  sodium chloride 0.9 % bolus 500 mL (500 mLs Intravenous New Bag/Given 06/13/16 0248)  ondansetron (ZOFRAN) injection 4 mg (4 mg Intravenous Given 06/13/16 0248)  dicyclomine (BENTYL) injection 20 mg (20 mg Intramuscular Given 06/13/16 0248)  ketorolac (TORADOL) 30 MG/ML injection 30 mg (30 mg Intravenous Given 06/13/16 0248)      Final Clinical Impressions(s) / ED Diagnoses  Lower abdominal pain:  No Murphy's sign LFTs have been elevated in the past.  Has not follow up with surgery.  Exam and vitals are benign and reassuring.  I do not feel advanced imaging is necessary at this time.  Pain improved post medication. The patient is nontoxic-appearing and imaging is negative for acute finding.Return for fevers, intractable vomiting or pain  or any concerns.   After history, exam, and medical workup I feel the patient has been appropriately medically  screened and is safe for discharge home. Pertinent diagnoses were discussed with the patient. Patient was given return precautions.   New Prescriptions New Prescriptions   No medications on file     Chaze Hruska, MD 06/13/16 [phone]

## 2016-06-19 ENCOUNTER — Inpatient Hospital Stay (HOSPITAL_BASED_OUTPATIENT_CLINIC_OR_DEPARTMENT_OTHER)
Admission: EM | Admit: 2016-06-19 | Discharge: 2016-06-23 | DRG: 415 | Disposition: A | Discharge status: Home / Self Care | Payer: Self-pay | Attending: Internal Medicine | Admitting: Internal Medicine

## 2016-06-19 ENCOUNTER — Encounter (HOSPITAL_BASED_OUTPATIENT_CLINIC_OR_DEPARTMENT_OTHER): Payer: Self-pay | Admitting: Emergency Medicine

## 2016-06-19 ENCOUNTER — Emergency Department (HOSPITAL_BASED_OUTPATIENT_CLINIC_OR_DEPARTMENT_OTHER): Payer: Self-pay

## 2016-06-19 DIAGNOSIS — Z885 Allergy status to narcotic agent status: Secondary | ICD-10-CM

## 2016-06-19 DIAGNOSIS — D573 Sickle-cell trait: Secondary | ICD-10-CM | POA: Diagnosis present

## 2016-06-19 DIAGNOSIS — B009 Herpesviral infection, unspecified: Secondary | ICD-10-CM | POA: Diagnosis present

## 2016-06-19 DIAGNOSIS — K819 Cholecystitis, unspecified: Secondary | ICD-10-CM

## 2016-06-19 DIAGNOSIS — K8064 Calculus of gallbladder and bile duct with chronic cholecystitis without obstruction: Principal | ICD-10-CM | POA: Diagnosis present

## 2016-06-19 DIAGNOSIS — F200 Paranoid schizophrenia: Secondary | ICD-10-CM | POA: Diagnosis present

## 2016-06-19 DIAGNOSIS — Z21 Asymptomatic human immunodeficiency virus [HIV] infection status: Secondary | ICD-10-CM | POA: Diagnosis present

## 2016-06-19 DIAGNOSIS — K8000 Calculus of gallbladder with acute cholecystitis without obstruction: Secondary | ICD-10-CM | POA: Diagnosis present

## 2016-06-19 DIAGNOSIS — E876 Hypokalemia: Secondary | ICD-10-CM | POA: Diagnosis present

## 2016-06-19 DIAGNOSIS — B20 Human immunodeficiency virus [HIV] disease: Secondary | ICD-10-CM | POA: Diagnosis present

## 2016-06-19 DIAGNOSIS — F329 Major depressive disorder, single episode, unspecified: Secondary | ICD-10-CM | POA: Diagnosis present

## 2016-06-19 DIAGNOSIS — F32A Depression, unspecified: Secondary | ICD-10-CM | POA: Diagnosis present

## 2016-06-19 DIAGNOSIS — K838 Other specified diseases of biliary tract: Secondary | ICD-10-CM | POA: Diagnosis present

## 2016-06-19 DIAGNOSIS — Z91040 Latex allergy status: Secondary | ICD-10-CM

## 2016-06-19 DIAGNOSIS — F209 Schizophrenia, unspecified: Secondary | ICD-10-CM | POA: Diagnosis present

## 2016-06-19 DIAGNOSIS — K8050 Calculus of bile duct without cholangitis or cholecystitis without obstruction: Secondary | ICD-10-CM

## 2016-06-19 DIAGNOSIS — K8043 Calculus of bile duct with acute cholecystitis with obstruction: Secondary | ICD-10-CM

## 2016-06-19 DIAGNOSIS — I10 Essential (primary) hypertension: Secondary | ICD-10-CM | POA: Diagnosis present

## 2016-06-19 DIAGNOSIS — R109 Unspecified abdominal pain: Secondary | ICD-10-CM

## 2016-06-19 LAB — CBC WITH DIFFERENTIAL/PLATELET
Basophils Absolute: 0 10*3/uL (ref 0.0–0.1)
Basophils Relative: 0 %
Eosinophils Absolute: 0 10*3/uL (ref 0.0–0.7)
Eosinophils Relative: 1 %
HCT: 37.1 % (ref 36.0–46.0)
Hemoglobin: 12.9 g/dL (ref 12.0–15.0)
Lymphocytes Relative: 19 %
Lymphs Abs: 1.3 10*3/uL (ref 0.7–4.0)
MCH: 29.3 pg (ref 26.0–34.0)
MCHC: 34.8 g/dL (ref 30.0–36.0)
MCV: 84.3 fL (ref 78.0–100.0)
Monocytes Absolute: 0.7 10*3/uL (ref 0.1–1.0)
Monocytes Relative: 11 %
Neutro Abs: 4.5 10*3/uL (ref 1.7–7.7)
Neutrophils Relative %: 69 %
Platelets: 254 10*3/uL (ref 150–400)
RBC: 4.4 MIL/uL (ref 3.87–5.11)
RDW: 13.7 % (ref 11.5–15.5)
WBC: 6.5 10*3/uL (ref 4.0–10.5)

## 2016-06-19 LAB — COMPREHENSIVE METABOLIC PANEL
ALT: 404 U/L — ABNORMAL HIGH (ref 14–54)
AST: 221 U/L — ABNORMAL HIGH (ref 15–41)
Albumin: 3.9 g/dL (ref 3.5–5.0)
Alkaline Phosphatase: 146 U/L — ABNORMAL HIGH (ref 38–126)
Anion gap: 11 (ref 5–15)
BUN: 9 mg/dL (ref 6–20)
CO2: 24 mmol/L (ref 22–32)
Calcium: 9 mg/dL (ref 8.9–10.3)
Chloride: 102 mmol/L (ref 101–111)
Creatinine, Ser: 0.76 mg/dL (ref 0.44–1.00)
GFR calc Af Amer: >60 mL/min (ref >60)
GFR calc non Af Amer: >60 mL/min (ref >60)
Glucose, Bld: 134 mg/dL — ABNORMAL HIGH (ref 65–99)
Potassium: 3.3 mmol/L — ABNORMAL LOW (ref 3.5–5.1)
Sodium: 137 mmol/L (ref 135–145)
Total Bilirubin: 0.8 mg/dL (ref 0.3–1.2)
Total Protein: 7.8 g/dL (ref 6.5–8.1)

## 2016-06-19 LAB — URINALYSIS, ROUTINE W REFLEX MICROSCOPIC
Bilirubin Urine: NEGATIVE
Glucose, UA: NEGATIVE mg/dL
Hgb urine dipstick: NEGATIVE
Ketones, ur: NEGATIVE mg/dL
Leukocytes, UA: NEGATIVE
Nitrite: NEGATIVE
Protein, ur: NEGATIVE mg/dL
Specific Gravity, Urine: 1.016 (ref 1.005–1.030)
pH: 7 (ref 5.0–8.0)

## 2016-06-19 LAB — LIPASE, BLOOD: Lipase: 60 U/L — ABNORMAL HIGH (ref 11–51)

## 2016-06-19 LAB — PREGNANCY, URINE: Preg Test, Ur: NEGATIVE

## 2016-06-19 MED ORDER — DEXTROSE 5 % IV SOLN
2.0000 g | Freq: Once | INTRAVENOUS | Status: AC
  Administered 2016-06-19: 2 g via INTRAVENOUS
  Filled 2016-06-19: qty 2

## 2016-06-19 MED ORDER — HYDROMORPHONE HCL 1 MG/ML IJ SOLN
0.5000 mg | Freq: Once | INTRAMUSCULAR | Status: AC
  Administered 2016-06-19: 0.5 mg via INTRAVENOUS
  Filled 2016-06-19: qty 1

## 2016-06-19 MED ORDER — SODIUM CHLORIDE 0.9 % IV SOLN
Freq: Once | INTRAVENOUS | Status: AC
  Administered 2016-06-19: 23:00:00 via INTRAVENOUS

## 2016-06-19 MED ORDER — ONDANSETRON HCL 4 MG/2ML IJ SOLN
4.0000 mg | Freq: Once | INTRAMUSCULAR | Status: AC
  Administered 2016-06-19: 4 mg via INTRAVENOUS
  Filled 2016-06-19: qty 2

## 2016-06-19 MED ORDER — SODIUM CHLORIDE 0.9 % IV BOLUS (SEPSIS)
1000.0000 mL | Freq: Once | INTRAVENOUS | Status: AC
  Administered 2016-06-19: 1000 mL via INTRAVENOUS

## 2016-06-19 NOTE — ED Provider Notes (Signed)
[facility] Provider Note   CSN: [id number] Arrival date & time: 06/19/16  1840  By signing my name below, I, Modena Jansky, attest that this documentation has been prepared under the direction and in the presence of non-physician practitioner, Jaynie Crumble, PA-C. Electronically Signed: Modena Jansky, Scribe. 06/19/2016. 7:13 PM.  History   Chief Complaint Chief Complaint  Patient presents with  . Abdominal Pain   The history is provided by the patient. No language interpreter was used.   HPI Comments: Adriana Fernandez is a 46 y.o. female with a PMHx of HIV and gallstones who presents to the Emergency Department complaining of constant moderate mid abdominal pain that started about 3 hours ago. She was seen in the ED on 2/25, 3/10,  06/12/16 for the same.  She ate tacos with ground [place] and tomatoes about 6 hours ago. No treatment PTA. Her pain today feels similar to prior pain since 05/04/16. She reports associated nausea and vomiting. Her last BM was yesterday and it was normal. Denies any back pain, diarrhea, urinary symptoms, or other complaints at this time. No blood in stool or emesis. No medications prior to coming in. States she tried to get into surgery office but was told she "was not on the list."      PCP: Jackie Plum, MD  Past Medical History:  Diagnosis Date  . Depression   . Elevated LFTs   . Gallstones   . HIV disease (HCC)   . 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 Hypertension   . Paranoid schizophrenia (HCC)   . Psychosis   . Schizophrenia (HCC)   . Sickle cell trait [facility])     Patient Active Problem List   Diagnosis Date Noted  . 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 Paranoid schizophrenia (HCC)   . Sickle cell trait (HCC)   . Schizophrenia (HCC) 12/09/2011  . Epistaxis 05/07/2011  .  HYPERPROLACTINEMIA 05/10/2009  . NIPPLE DISCHARGE 05/05/2009  . Human immunodeficiency virus (HIV) disease (HCC) 12/27/2005  . DEPRESSION 12/27/2005    History reviewed. No pertinent surgical history.  OB History    No data available       Home Medications    Prior to Admission medications   Medication Sig Start Date End Date Taking? Authorizing Provider  amLODipine (NORVASC) 5 MG tablet Take 1 tablet (5 mg total) by mouth daily. 04/21/16   [name], MD  buPROPion (WELLBUTRIN SR) 150 MG 12 hr tablet Take 1 tablet (150 mg total) by mouth 2 (two) times daily. 06/24/12   Randall Hiss, MD  dicyclomine (BENTYL) 20 MG tablet Take 1 tablet (20 mg total) by mouth 2 (two) times daily. 06/13/16   April Palumbo, MD  hydrochlorothiazide (HYDRODIURIL) 25 MG tablet Take 1 tablet (25 mg total) by mouth daily. 04/21/16   [name], MD  ibuprofen (ADVIL,MOTRIN) 600 MG tablet Take 1 tablet (600 mg total) by mouth every 6 (six) hours as needed. 05/04/16   [name], MD  ondansetron (ZOFRAN ODT) 8 MG disintegrating tablet  ODT q8 hours prn nausea 06/13/16   April Palumbo, MD  paliperidone (INVEGA) 3 MG 24 hr tablet Take 1.5 mg by mouth daily.     Historical Provider, MD  traMADol (ULTRAM) 50 MG tablet Take 1 tablet (50 mg total) by mouth every 6 (six) hours as needed. 05/04/16  [name], MD  valACYclovir (VALTREX) 1000 MG tablet Take 1,000 mg by mouth daily.    Historical Provider, MD    Family History Family History  Problem Relation Age of Onset  . GER disease Mother   . Hypertension Mother   . Sickle cell anemia Brother   . Diabetes Maternal Aunt   . Diabetes Maternal Grandmother   . Breast cancer Maternal Aunt   . Throat cancer Paternal Grandmother     Social History Social History  Substance Use Topics  . Smoking status: Never Smoker  . Smokeless tobacco: Never Used  . Alcohol use No     Allergies   Latex   Review of Systems Review of Systems    Constitutional: Negative for chills and fever.  Respiratory: Negative for chest tightness.   Gastrointestinal: Positive for abdominal pain, nausea and vomiting. Negative for constipation and diarrhea.  Genitourinary: Negative for dysuria and hematuria.  Musculoskeletal: Negative for back pain.  All other systems reviewed and are negative.    Physical Exam Updated Vital Signs BP 115/70   Pulse (!) 114   Temp 99.8 F (37.7 C) (Oral)   Resp 18   Ht  (1.702 m)   Wt 150 lb (68 kg)   SpO2 100%   BMI 23.49 kg/m   Physical Exam  Constitutional: She is oriented to person, place, and time. She appears well-developed and well-nourished. No distress.  HENT:  Head: Normocephalic.  Eyes: Conjunctivae are normal.  Neck: Neck supple.  Cardiovascular: Normal rate, regular rhythm and normal heart sounds.   Pulmonary/Chest: Effort normal and breath sounds normal. No respiratory distress. She has no wheezes. She has no rales.  Abdominal: Soft. Bowel sounds are normal. She exhibits no distension and no mass. There is tenderness. There is no guarding.  Epigastric and RUQ tenderness  Musculoskeletal: Normal range of motion.  Neurological: She is alert and oriented to person, place, and time.  Skin: Skin is warm and dry.  Psychiatric: She has a normal mood and affect.  Nursing note and vitals reviewed.    ED Treatments / Results  DIAGNOSTIC STUDIES: Oxygen Saturation is 100% on RA, normal by my interpretation.    COORDINATION OF CARE: 7:17 PM- Pt advised of plan for treatment and pt agrees.  Labs (all labs ordered are listed, but only abnormal results are displayed) Labs Reviewed  COMPREHENSIVE METABOLIC PANEL - Abnormal; Notable for the following:       Result Value   Potassium 3.3 (*)    Glucose, Bld 134 (*)    AST 221 (*)    ALT 404 (*)    Alkaline Phosphatase 146 (*)    All other components within normal limits  LIPASE, BLOOD - Abnormal; Notable for the following:     Lipase 60 (*)    All other components within normal limits  CBC WITH DIFFERENTIAL/PLATELET  PREGNANCY, URINE  URINALYSIS, ROUTINE W REFLEX MICROSCOPIC  T-HELPER CELLS (CD4) COUNT (NOT AT [facility])  HIV 1 RNA QUANT-NO REFLEX-BLD    EKG  EKG Interpretation None       Radiology US Abdomen Limited Ruq  Result Date: 06/19/2016 CLINICAL DATA:  Intermittent right upper quadrant pain times several months, latest episode x5 hours. EXAM: US ABDOMEN LIMITED - RIGHT UPPER QUADRANT COMPARISON:  None. FINDINGS: Gallbladder: Numerous layering gallstones are seen within the gallbladder and at the neck of the gallbladder as well the largest measuring 1.1 cm. Gallbladder wall is top normal at 2.5 mm in thickness. Trace  pericholecystic edema and fluid. No sonographic Murphy's however the patient was reportedly on pain medication. Common bile duct: Diameter: 7 mm.  No choledocholithiasis. Liver: No focal lesion identified. Within normal limits in parenchymal echogenicity. IMPRESSION: Numerous gallstones are seen within a moderately distended appearing gallbladder with trace pericholecystic fluid. Findings raise concern for acute cholecystitis. Electronically Signed   By: Tollie Eth M.D.   On: 06/19/2016 22:26    Procedures Procedures (including critical care time)  Medications Ordered in ED Medications  cefTRIAXone (ROCEPHIN) 2 g in dextrose 5 % 50 mL IVPB (2 g Intravenous New Bag/Given 06/19/16 2239)  HYDROmorphone (DILAUDID) injection 0.5 mg (0.5 mg Intravenous Given 06/19/16 1937)  ondansetron (ZOFRAN) injection 4 mg (4 mg Intravenous Given 06/19/16 1936)  sodium chloride 0.9 % bolus 1,000 mL (0 mLs Intravenous Stopped 06/19/16 2030)  0.9 %  sodium chloride infusion ( Intravenous New Bag/Given 06/19/16 2239)     Initial Impression / Assessment and Plan / ED Course  I have reviewed the triage vital signs and the nursing notes.  Pertinent labs & imaging results that were available during my care of the  patient were reviewed by me and considered in my medical decision making (see chart for details).     Patient with recurrent epigastric and right upper quadrant pain. Ultrasound on 05/04/16 showed gallbladder stones with no signs of cholecystitis at that time. Her LFTs were mildly elevated. Based on patient's history and notes, patient felt much better and wanted to go home. She was seen again one week ago, and again was noted to have elevated LFTs and again felt better after administration of medications and wanted to go home. She apparently tried to get in with general surgeon but was unable to get an appointment. I will give her pain medications, antiemetics, recheck labs. We will get right upper quadrant ultrasound.  11:13 PM Patient feels much better after medications. Her ultrasound today is concerning for acute cholecystitis. Patient's LFTs are even more elevated today with AST of 221, ALT 404. Alkaline phosphatase is elevated at 146. I discussed patient with Dr. Madilyn Fireman, however since patient will be transferred to another [facility] facility and they're not on-call for [facility], he asked to have a hospitalist repage for consult.  hospitalist paged.   12:14 AM I spoke with hospitalist, asked me to page and talk to general surgery and with gastroenterology again.  I spoke with Dr. Carolynne Edouard with general surgery, who advised GI consult first. Spoke with Dr. Pelzer Bing again, per hospitalist request  Pt will be transferred to [facility]. She continues to be pain free.    Final Clinical Impressions(s) / ED Diagnoses   Final diagnoses:  Biliary colic    New Prescriptions New Prescriptions   No medications on file   I personally performed the services described in this documentation, which was scribed in my presence. The recorded information has been reviewed and is accurate.     Jaynie Crumble, PA-C 06/20/16 0039    Jaynie Crumble, PA-C 06/20/16 1612    Melene Plan, DO 06/20/16 2145

## 2016-06-19 NOTE — ED Triage Notes (Signed)
Abd pain with vomiting that started around 4

## 2016-06-19 NOTE — ED Notes (Signed)
ED Provider at bedside. 

## 2016-06-19 NOTE — ED Notes (Signed)
Pt vomited a large amount.

## 2016-06-20 ENCOUNTER — Encounter (HOSPITAL_BASED_OUTPATIENT_CLINIC_OR_DEPARTMENT_OTHER): Payer: Self-pay | Admitting: Internal Medicine

## 2016-06-20 ENCOUNTER — Inpatient Hospital Stay (HOSPITAL_COMMUNITY): Payer: Self-pay

## 2016-06-20 DIAGNOSIS — E876 Hypokalemia: Secondary | ICD-10-CM | POA: Diagnosis present

## 2016-06-20 DIAGNOSIS — F2089 Other schizophrenia: Secondary | ICD-10-CM

## 2016-06-20 DIAGNOSIS — F209 Schizophrenia, unspecified: Secondary | ICD-10-CM

## 2016-06-20 DIAGNOSIS — B20 Human immunodeficiency virus [HIV] disease: Secondary | ICD-10-CM

## 2016-06-20 DIAGNOSIS — F200 Paranoid schizophrenia: Secondary | ICD-10-CM

## 2016-06-20 DIAGNOSIS — K8000 Calculus of gallbladder with acute cholecystitis without obstruction: Secondary | ICD-10-CM | POA: Diagnosis present

## 2016-06-20 DIAGNOSIS — K810 Acute cholecystitis: Secondary | ICD-10-CM

## 2016-06-20 DIAGNOSIS — K8043 Calculus of bile duct with acute cholecystitis with obstruction: Secondary | ICD-10-CM

## 2016-06-20 LAB — COMPREHENSIVE METABOLIC PANEL
ALT: 470 U/L — ABNORMAL HIGH (ref 14–54)
AST: 188 U/L — ABNORMAL HIGH (ref 15–41)
Albumin: 3.5 g/dL (ref 3.5–5.0)
Alkaline Phosphatase: 147 U/L — ABNORMAL HIGH (ref 38–126)
Anion gap: 6 (ref 5–15)
BUN: 6 mg/dL (ref 6–20)
CO2: 24 mmol/L (ref 22–32)
Calcium: 8.5 mg/dL — ABNORMAL LOW (ref 8.9–10.3)
Chloride: 110 mmol/L (ref 101–111)
Creatinine, Ser: 0.66 mg/dL (ref 0.44–1.00)
GFR calc Af Amer: >60 mL/min (ref >60)
GFR calc non Af Amer: >60 mL/min (ref >60)
Glucose, Bld: 89 mg/dL (ref 65–99)
Potassium: 3.9 mmol/L (ref 3.5–5.1)
Sodium: 140 mmol/L (ref 135–145)
Total Bilirubin: 0.6 mg/dL (ref 0.3–1.2)
Total Protein: 6.9 g/dL (ref 6.5–8.1)

## 2016-06-20 LAB — CBC
HCT: 34.3 % — ABNORMAL LOW (ref 36.0–46.0)
Hemoglobin: 11.8 g/dL — ABNORMAL LOW (ref 12.0–15.0)
MCH: 29.6 pg (ref 26.0–34.0)
MCHC: 34.4 g/dL (ref 30.0–36.0)
MCV: 86 fL (ref 78.0–100.0)
Platelets: 234 10*3/uL (ref 150–400)
RBC: 3.99 MIL/uL (ref 3.87–5.11)
RDW: 14.1 % (ref 11.5–15.5)
WBC: 4.7 10*3/uL (ref 4.0–10.5)

## 2016-06-20 LAB — PROTIME-INR
INR: 1.04
Prothrombin Time: 13.7 seconds (ref 11.4–15.2)

## 2016-06-20 LAB — APTT: aPTT: 31 seconds (ref 24–36)

## 2016-06-20 LAB — ABO/RH: ABO/RH(D): A POS

## 2016-06-20 LAB — LIPASE, BLOOD: Lipase: 15 U/L (ref 11–51)

## 2016-06-20 MED ORDER — AMLODIPINE BESYLATE 5 MG PO TABS: 5.0000 mg | ORAL_TABLET | Freq: Every day | ORAL | Status: DC

## 2016-06-20 MED ORDER — SODIUM CHLORIDE 0.9 % IV BOLUS (SEPSIS)
1500.0000 mL | Freq: Once | INTRAVENOUS | Status: AC
  Administered 2016-06-20: 1500 mL via INTRAVENOUS

## 2016-06-20 MED ORDER — TRAMADOL HCL 50 MG PO TABS
50.0000 mg | ORAL_TABLET | Freq: Four times a day (QID) | ORAL | Status: DC | PRN
Start: 2016-06-20 — End: 2016-06-23
  Administered 2016-06-20 (×2): 50 mg via ORAL
  Filled 2016-06-20 (×3): qty 1

## 2016-06-20 MED ORDER — VALACYCLOVIR HCL 500 MG PO TABS
1000.0000 mg | ORAL_TABLET | Freq: Every day | ORAL | Status: DC
  Administered 2016-06-20 – 2016-06-23 (×4): 1000 mg via ORAL
  Filled 2016-06-20 (×4): qty 2

## 2016-06-20 MED ORDER — POTASSIUM CHLORIDE 10 MEQ/100ML IV SOLN
10.0000 meq | Freq: Once | INTRAVENOUS | Status: AC
  Administered 2016-06-20: 10 meq via INTRAVENOUS
  Filled 2016-06-20: qty 100

## 2016-06-20 MED ORDER — MORPHINE SULFATE (PF) 4 MG/ML IV SOLN
2.0000 mg | INTRAVENOUS | Status: DC | PRN
  Administered 2016-06-20 – 2016-06-22 (×5): 2 mg via INTRAVENOUS
  Filled 2016-06-20 (×5): qty 1

## 2016-06-20 MED ORDER — PIPERACILLIN-TAZOBACTAM 3.375 G IVPB
3.3750 g | Freq: Three times a day (TID) | INTRAVENOUS | Status: DC
  Administered 2016-06-20 – 2016-06-23 (×11): 3.375 g via INTRAVENOUS
  Filled 2016-06-20 (×13): qty 50

## 2016-06-20 MED ORDER — ONDANSETRON HCL 4 MG/2ML IJ SOLN: 4.0000 mg | Freq: Three times a day (TID) | INTRAMUSCULAR | Status: DC | PRN

## 2016-06-20 MED ORDER — DICYCLOMINE HCL 20 MG PO TABS
20.0000 mg | ORAL_TABLET | Freq: Two times a day (BID) | ORAL | Status: DC
  Administered 2016-06-20 – 2016-06-23 (×6): 20 mg via ORAL
  Filled 2016-06-20 (×7): qty 1

## 2016-06-20 MED ORDER — BUPROPION HCL ER (SR) 150 MG PO TB12
150.0000 mg | ORAL_TABLET | Freq: Two times a day (BID) | ORAL | Status: DC
  Administered 2016-06-20 – 2016-06-23 (×7): 150 mg via ORAL
  Filled 2016-06-20 (×7): qty 1

## 2016-06-20 MED ORDER — POTASSIUM CHLORIDE IN NACL 20-0.9 MEQ/L-% IV SOLN
INTRAVENOUS | Status: DC
  Administered 2016-06-20 – 2016-06-22 (×3): via INTRAVENOUS
  Filled 2016-06-20 (×5): qty 1000

## 2016-06-20 MED ORDER — POTASSIUM CHLORIDE 20 MEQ/15ML (10%) PO SOLN
20.0000 meq | Freq: Once | ORAL | Status: AC
  Administered 2016-06-20: 20 meq via ORAL
  Filled 2016-06-20: qty 15

## 2016-06-20 MED ORDER — PALIPERIDONE ER 3 MG PO TB24
3.0000 mg | ORAL_TABLET | Freq: Every day | ORAL | Status: DC
  Administered 2016-06-20 – 2016-06-23 (×4): 3 mg via ORAL
  Filled 2016-06-20 (×4): qty 1

## 2016-06-20 MED ORDER — HYDROMORPHONE HCL 1 MG/ML IJ SOLN
0.5000 mg | Freq: Once | INTRAMUSCULAR | Status: AC
  Administered 2016-06-20: 0.5 mg via INTRAVENOUS
  Filled 2016-06-20: qty 1

## 2016-06-20 MED ORDER — SODIUM CHLORIDE 0.9 % IV SOLN: INTRAVENOUS | Status: DC

## 2016-06-20 MED ORDER — GADOBENATE DIMEGLUMINE 529 MG/ML IV SOLN
15.0000 mL | Freq: Once | INTRAVENOUS | Status: AC | PRN
  Administered 2016-06-20: 14 mL via INTRAVENOUS

## 2016-06-20 MED ORDER — ZOLPIDEM TARTRATE 5 MG PO TABS
5.0000 mg | ORAL_TABLET | Freq: Every evening | ORAL | Status: DC | PRN
Start: 2016-06-20 — End: 2016-06-23

## 2016-06-20 NOTE — Consult Note (Signed)
Referring Provider:   David Tat, MD Primary Care Physician:  OSEI-BONSU,GEORGE, MD Primary Gastroenterologist:  None (unassigned)  Reason for Consultation:  Elevated liver chemistries in the setting of gallstones  HPI: Adriana Fernandez is a 45 y.o. female admitted to the hospital early this morning. She had been in and out of the emergency room on 2 or 3 occasions over the past 6 weeks with recurrent upper abdominal discomfort and nausea and vomiting. Ultrasound 6 weeks ago had shown gallstones with a 3 mm duct; ultrasound last night showed a 7 mm duct with a distended gallbladder and a small amount of pericholecystic fluid raising the question of acute cholecystitis, although overnight on antibiotics and pain medication, the patient has been pain-free and afebrile, and her white count was normal on presentation.  Of note, on presentation the patient's liver chemistries were elevated, with transaminases in the 200-400 range, where as back in February, they had been completely normal. They have been repeatedly mildly elevated, in the 100-150 range, for the past 6 weeks when checked in the emergency room. Lipase have been normal on prior occasions but now is minimally elevated at 60. Of note, the patient's bilirubin level has been consistently normal.  Updated labs for this morning were not ordered, so I have just ordered them.  It is interesting that, over the past 9 months, the patient has achieved an intentional 66 pounds weight loss through a weight control program.   Past Medical History:  Diagnosis Date  . Depression   . Elevated LFTs   . Gallstones   . HIV disease (HCC)   . 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 Hypertension   . Paranoid schizophrenia (HCC)   . Psychosis   . Schizophrenia (HCC)   . Sickle cell trait (HCC)     History reviewed. No pertinent surgical history.  Prior to Admission medications    Medication Sig Start Date End Date Taking? Authorizing Provider  amLODipine (NORVASC) 5 MG tablet Take 1 tablet (5 mg total) by mouth daily. 04/21/16  Yes [name], MD  buPROPion (WELLBUTRIN SR) 150 MG 12 hr tablet Take 1 tablet (150 mg total) by mouth 2 (two) times daily. 06/24/12  Yes Cornelius N Van Dam, MD  dicyclomine (BENTYL) 20 MG tablet Take 1 tablet (20 mg total) by mouth 2 (two) times daily. 06/13/16  Yes April Palumbo, MD  hydrochlorothiazide (HYDRODIURIL) 25 MG tablet Take 1 tablet (25 mg total) by mouth daily. 04/21/16  Yes [name], MD  ibuprofen (ADVIL,MOTRIN) 600 MG tablet Take 1 tablet (600 mg total) by mouth every 6 (six) hours as needed. Patient taking differently: Take 600 mg by mouth every 6 (six) hours as needed for headache.  05/04/16  Yes Melanie Belfi, MD  ondansetron (ZOFRAN ODT) 8 MG disintegrating tablet 8mg ODT q8 hours prn nausea Patient taking differently: Take 8 mg by mouth every 8 (eight) hours as needed for nausea or vomiting. 8mg ODT q8 hours prn nausea 06/13/16  Yes April Palumbo, MD  paliperidone (INVEGA) 3 MG 24 hr tablet Take 3 mg by mouth daily.    Yes Historical Provider, MD  traMADol (ULTRAM) 50 MG tablet Take 1 tablet (50 mg total) by mouth every 6 (six) hours as needed. Patient taking differently: Take 50 mg by mouth every 6 (six) hours as needed for moderate pain.  05/04/16  Yes Melanie Belfi, MD  valACYclovir (VALTREX) 1000 MG tablet Take 1,000   mg by mouth daily.   Yes Historical Provider, MD    Current Facility-Administered Medications  Medication Dose Route Frequency Provider Last Rate Last Dose  . amLODipine (NORVASC) tablet 5 mg  5 mg Oral Daily David Tat, MD      . buPROPion (WELLBUTRIN SR) 12 hr tablet 150 mg  150 mg Oral BID Xilin Niu, MD      . dicyclomine (BENTYL) tablet 20 mg  20 mg Oral BID Xilin Niu, MD      . morphine 4 MG/ML injection 2 mg  2 mg Intravenous Q4H PRN Xilin Niu, MD   2 mg at 06/20/16 0725  . ondansetron (ZOFRAN) injection  4 mg  4 mg Intravenous Q8H PRN Xilin Niu, MD      . paliperidone (INVEGA) 24 hr tablet 3 mg  3 mg Oral Daily Xilin Niu, MD      . piperacillin-tazobactam (ZOSYN) IVPB 3.375 g  3.375 g Intravenous Q8H Anastassia Doutova, MD 12.5 mL/hr at 06/20/16 0543 3.375 g at 06/20/16 0543  . traMADol (ULTRAM) tablet 50 mg  50 mg Oral Q6H PRN Xilin Niu, MD      . valACYclovir (VALTREX) tablet 1,000 mg  1,000 mg Oral Daily Xilin Niu, MD      . zolpidem (AMBIEN) tablet 5 mg  5 mg Oral QHS PRN Xilin Niu, MD        Allergies as of 06/19/2016 - Review Complete 06/19/2016  Allergen Reaction Noted  . Latex  03/15/2012    Family History  Problem Relation Age of Onset  . GER disease Mother   . Hypertension Mother   . Sickle cell anemia Brother   . Diabetes Maternal Aunt   . Diabetes Maternal Grandmother   . Breast cancer Maternal Aunt   . Throat cancer Paternal Grandmother     Social History   Social History  . Marital status: Divorced    Spouse name: N/A  . Number of children: N/A  . Years of education: N/A   Occupational History  . Not on file.   Social History Main Topics  . Smoking status: Never Smoker  . Smokeless tobacco: Never Used  . Alcohol use No  . Drug use: No  . Sexual activity: Yes    Partners: Male    Birth control/ protection: IUD   Other Topics Concern  . Not on file   Social History Narrative  . No narrative on file    Review of Systems: No known cardiopulmonary disease or symptoms in this nonsmoker. No angina, no urinary symptoms, no bowel problems, no skin problems or swollen lymph nodes.  Physical Exam: Vital signs in last 24 hours: Temp:  [98 F (36.7 C)-99.8 F (37.7 C)] 98 F (36.7 C) (04/26 0231) Pulse Rate:  [59-114] 59 (04/26 0231) Resp:  [16-20] 16 (04/26 0231) BP: (96-115)/(56-74) 96/56 (04/26 0231) SpO2:  [98 %-100 %] 98 % (04/26 0231) Weight:  [68 kg (150 lb)] 68 kg (150 lb) (04/25 1846) Last BM Date: 06/19/16 General:   Alert,  Well-developed,  well-nourished, pleasant and cooperative in NAD Head:  Normocephalic and atraumatic. Eyes:  Sclera clear, no icterus.   Conjunctiva pink. Mouth:   No ulcerations or lesions.  Oropharynx pink & moist. Neck:   No masses or thyromegaly. Lungs:  Clear throughout to auscultation.   No wheezes, crackles, or rhonchi. No evident respiratory distress. Heart:   Regular rate and rhythm; no murmurs, clicks, rubs,  or gallops. Abdomen:  Soft, nontender, nontympanitic, and nondistended.   No masses, hepatosplenomegaly or ventral hernias noted. Overall, this is a very benign abdomen, with no discernible tenderness, even on fairly firm palpation of the right upper quadrant. Msk:   Symmetrical without gross deformities. Pulses:  Normal radial pulse is noted. Extremities:   Without clubbing, cyanosis, or edema. Neurologic:  Alert and coherent;  grossly normal neurologically. Skin:  Intact without significant lesions or rashes. Cervical Nodes:  No significant cervical adenopathy. Psych:   Alert and cooperative. Normal mood and affect.  Intake/Output from previous day: 04/25 0701 - 04/26 0700 In: 3250 [IV Piggyback:3250] Out: -  Intake/Output this shift: No intake/output data recorded.  Lab Results:  Recent Labs  06/19/16 1923  WBC 6.5  HGB 12.9  HCT 37.1  PLT 254   BMET  Recent Labs  06/19/16 1923  NA 137  K 3.3*  CL 102  CO2 24  GLUCOSE 134*  BUN 9  CREATININE 0.76  CALCIUM 9.0   LFT  Recent Labs  06/19/16 1923  PROT 7.8  ALBUMIN 3.9  AST 221*  ALT 404*  ALKPHOS 146*  BILITOT 0.8   PT/INR  Recent Labs  06/20/16 0759  LABPROT 13.7  INR 1.04    Studies/Results: Us Abdomen Limited Ruq  Result Date: 06/19/2016 CLINICAL DATA:  Intermittent right upper quadrant pain times several months, latest episode x5 hours. EXAM: US ABDOMEN LIMITED - RIGHT UPPER QUADRANT COMPARISON:  None. FINDINGS: Gallbladder: Numerous layering gallstones are seen within the gallbladder and at the  neck of the gallbladder as well the largest measuring 1.1 cm. Gallbladder wall is top normal at 2.5 mm in thickness. Trace pericholecystic edema and fluid. No sonographic Murphy's however the patient was reportedly on pain medication. Common bile duct: Diameter: 7 mm.  No choledocholithiasis. Liver: No focal lesion identified. Within normal limits in parenchymal echogenicity. IMPRESSION: Numerous gallstones are seen within a moderately distended appearing gallbladder with trace pericholecystic fluid. Findings raise concern for acute cholecystitis. Electronically Signed   By: David  Kwon M.D.   On: 06/19/2016 22:26    Impression: 1. Recurring upper abdominal symptoms of pain and nausea and vomiting 2. Cholelithiasis 3. Progressive recent onset elevation of liver chemistries and progressive, although not frankly abnormal, dilatation of the CBD on serial ultrasounds over the past 6 weeks, raising question of choledocholithiasis 4. Minimal elevation of lipase last night, of doubtful clinical significance  Plan: 1. Recheck labs today  2. Obtain abdominal MRI with MRCP to check for choledocholithiasis. If confirmed, the patient would need preoperative ERCP.  At this time, the patient appears to be a "intermediate" probability of having a common duct stone, so I don't think that there is sufficient indication to proceed directly to ERCP, unless the MRI is abnormal with the patient's liver chemistries show progressive increase. 3. Surgical consultation. Have discussed case with Dr. David Newman of Gen. surgery   LOS: 0 days   Euell Schiff V  06/20/2016, 9:04 AM   Pager [phone] If no answer or after 5 PM call [phone] 

## 2016-06-20 NOTE — Plan of Care (Signed)
45 year old history of schizophrenia and bipolar disorder as well as HIV presents with epigastric pain with elevated LFTs with some worrisome for cholecystitis   Discussed with ER provider requested called to GI to make them aware to see patient in the morning as well as cold to surgery to make them aware we'll consult in a.m. and if needed HIDA scan. If high HIDA scan requested will order on arrival  Adriana Fernandez 12:08 AM

## 2016-06-20 NOTE — Progress Notes (Signed)
Addendum to earlier note:  Labs ordered for tomorrow.  D/w Dr. Ezzard Standing.  Florencia Reasons, M.D. Pager [phone] If no answer or after 5 PM call [phone]

## 2016-06-20 NOTE — H&P (Signed)
History and Physical    Adriana Fernandez:[id number] DOB: [birth year] DOA: 06/19/2016  Referring MD/NP/PA:   PCP: Jackie Plum, MD   Patient coming from:  The patient is coming from home.  At baseline, pt is independent for most of ADL.   Chief Complaint: Nausea, vomiting, abdominal pain  HPI: Adriana Fernandez is a 46 y.o. female with medical history significant of hypertension, depression, sickle cell trait, schizophrenia, psychosis, paranoid, HIV (CD4 800, viral load less than 20 on 04/11/16, not on any medications), HSV-1 and 2, who presents with nausea, vomiting and abdominal pain.  Patient states that she suddenly started having abdominal pain at 3:45 PM. It is located in the middle abdomen, constant, 10 out of 10 in severity, burning like pain, nonradiating. It is associated with nausea, vomiting. She vomited 5 times without blood in the vomitus. Patient does not have diarrhea, fever or chills. Patient denies chest pain, SOB, cough, symptoms of UTI or unilateral weakness. Pt was treated with zofran and IV dilaudid in ED. Pt states that currently she does not have any nausea, vomiting or abdominal pain.  ED Course: pt was found to have WBC 6.5, lipase 60, pregnancy test negative, negative urinalysis, potassium 3.3, creatinine normal, abnormal liver functions with ALP 146, AST 221, ALT 404, total bilirubin 0.8. Ultrasound of RUQ showed multiple gallstones and possible acute cholecystitis. Patient is admitted to MedSurg bed as inpatient.  Review of Systems:   General: no fevers, chills, no changes in body weight, has poor appetite, has fatigue HEENT: no blurry vision, hearing changes or sore throat Respiratory: no dyspnea, coughing, wheezing CV: no chest pain, no palpitations GI: has nausea, vomiting, abdominal pain, no diarrhea, constipation GU: no dysuria, burning on urination, increased urinary frequency, hematuria  Ext: no leg edema Neuro: no unilateral weakness, numbness,  or tingling, no vision change or hearing loss Skin: no rash, no skin tear. MSK: No muscle spasm, no deformity, no limitation of range of movement in spin Heme: No easy bruising.  Travel history: No recent long distant travel.  Allergy:  Allergies  Allergen Reactions  . Latex     Past Medical History:  Diagnosis Date  . Depression   . Elevated LFTs   . Gallstones   . HIV disease (HCC)   . 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 Hypertension   . Paranoid schizophrenia (HCC)   . Psychosis   . Schizophrenia (HCC)   . Sickle cell trait (HCC)     History reviewed. No pertinent surgical history.  Social History:  reports that she has never smoked. She has never used smokeless tobacco. She reports that she does not drink alcohol or use drugs.  Family History:  Family History  Problem Relation Age of Onset  . GER disease Mother   . Hypertension Mother   . Sickle cell anemia Brother   . Diabetes Maternal Aunt   . Diabetes Maternal Grandmother   . Breast cancer Maternal Aunt   . Throat cancer Paternal Grandmother      Prior to Admission medications   Medication Sig Start Date End Date Taking? Authorizing Provider  amLODipine (NORVASC) 5 MG tablet Take 1 tablet (5 mg total) by mouth daily. 04/21/16   [name], MD  buPROPion (WELLBUTRIN SR) 150 MG 12 hr tablet Take 1 tablet (150 mg total) by mouth 2 (two) times daily. 06/24/12   Randall Hiss, MD  dicyclomine (BENTYL) 20  MG tablet Take 1 tablet (20 mg total) by mouth 2 (two) times daily. 06/13/16   April Palumbo, MD  hydrochlorothiazide (HYDRODIURIL) 25 MG tablet Take 1 tablet (25 mg total) by mouth daily. 04/21/16   [name], MD  ibuprofen (ADVIL,MOTRIN) 600 MG tablet Take 1 tablet (600 mg total) by mouth every 6 (six) hours as needed. 05/04/16   [name], MD  ondansetron (ZOFRAN ODT) 8 MG disintegrating tablet  ODT q8 hours prn nausea 06/13/16    April Palumbo, MD  paliperidone (INVEGA) 3 MG 24 hr tablet Take 1.5 mg by mouth daily.     Historical Provider, MD  traMADol (ULTRAM) 50 MG tablet Take 1 tablet (50 mg total) by mouth every 6 (six) hours as needed. 05/04/16   [name], MD  valACYclovir (VALTREX) 1000 MG tablet Take 1,000 mg by mouth daily.    Historical Provider, MD    Physical Exam: Vitals:   06/19/16 2246 06/20/16 0045 06/20/16 0119 06/20/16 0231  BP: 100/74 110/62 96/68 (!) 96/56  Pulse: 64 81 74 (!) 59  Resp: Temp:   98.3 F (36.8 C) 98 F (36.7 C)  TempSrc: Oral  Oral Oral  SpO2: 100% 99% 99% 98%  Weight:      Height:       General: Not in acute distress HEENT:       Eyes: PERRL, EOMI, no scleral icterus.       ENT: No discharge from the ears and nose, no pharynx injection, no tonsillar enlargement.        Neck: No JVD, no bruit, no mass felt. Heme: No neck lymph node enlargement. Cardiac: S1/S2, RRR, No murmurs, No gallops or rubs. Respiratory: No rales, wheezing, rhonchi or rubs. GI: Soft, nondistended, nontender, no rebound pain, no organomegaly, BS present. GU: No hematuria Ext: No pitting leg edema bilaterally. 2+DP/PT pulse bilaterally. Musculoskeletal: No joint deformities, No joint redness or warmth, no limitation of ROM in spin. Skin: No rashes.  Neuro: Alert, oriented X3, cranial nerves II-XII grossly intact, moves all extremities normally. Psych: Patient is not psychotic, no suicidal or hemocidal ideation.  Labs on Admission: I have personally reviewed following labs and imaging studies  CBC:  Recent Labs Lab 06/19/16 1923  WBC 6.5  NEUTROABS 4.5  HGB 12.9  HCT 37.1  MCV 84.3  PLT 254   Basic Metabolic Panel:  Recent Labs Lab 06/19/16 1923  NA 137  K 3.3*  CL 102  CO2 24  GLUCOSE 134*  BUN 9  CREATININE 0.76  CALCIUM 9.0   GFR: Estimated Creatinine Clearance: 88.2 mL/min (by C-G formula based on SCr of 0.76 mg/dL). Liver Function Tests:  Recent  Labs Lab 06/19/16 1923  AST 221*  ALT 404*  ALKPHOS 146*  BILITOT 0.8  PROT 7.8  ALBUMIN 3.9    Recent Labs Lab 06/19/16 1923  LIPASE 60*   No results for input(s): AMMONIA in the last 168 hours. Coagulation Profile: No results for input(s): INR, PROTIME in the last 168 hours. Cardiac Enzymes: No results for input(s): CKTOTAL, CKMB, CKMBINDEX, TROPONINI in the last 168 hours. BNP (last 3 results) No results for input(s): PROBNP in the last 8760 hours. HbA1C: No results for input(s): HGBA1C in the last 72 hours. CBG: No results for input(s): GLUCAP in the last 168 hours. Lipid Profile: No results for input(s): CHOL, HDL, LDLCALC, TRIG, CHOLHDL, LDLDIRECT in the last 72 hours. Thyroid Function Tests: No results for input(s): TSH, T4TOTAL, FREET4,  T3FREE, THYROIDAB in the last 72 hours. Anemia Panel: No results for input(s): VITAMINB12, FOLATE, FERRITIN, TIBC, IRON, RETICCTPCT in the last 72 hours. Urine analysis:    Component Value Date/Time   COLORURINE YELLOW 06/19/2016 1919   APPEARANCEUR CLEAR 06/19/2016 1919   LABSPEC 1.016 06/19/2016 1919   PHURINE 7.0 06/19/2016 1919   GLUCOSEU NEGATIVE 06/19/2016 1919   HGBUR NEGATIVE 06/19/2016 1919   BILIRUBINUR NEGATIVE 06/19/2016 1919   KETONESUR NEGATIVE 06/19/2016 1919   PROTEINUR NEGATIVE 06/19/2016 1919   UROBILINOGEN 1 11/09/2012 1006   NITRITE NEGATIVE 06/19/2016 1919   LEUKOCYTESUR NEGATIVE 06/19/2016 1919   Sepsis Labs: (procalcitonin:4,lacticidven:4) )No results found for this or any previous visit (from the past 240 hour(s)).   Radiological Exams on Admission: US Abdomen Limited Ruq  Result Date: 06/19/2016 CLINICAL DATA:  Intermittent right upper quadrant pain times several months, latest episode x5 hours. EXAM: US ABDOMEN LIMITED - RIGHT UPPER QUADRANT COMPARISON:  None. FINDINGS: Gallbladder: Numerous layering gallstones are seen within the gallbladder and at the neck of the gallbladder as well  the largest measuring 1.1 cm. Gallbladder wall is top normal at 2.5 mm in thickness. Trace pericholecystic edema and fluid. No sonographic Murphy's however the patient was reportedly on pain medication. Common bile duct: Diameter: 7 mm.  No choledocholithiasis. Liver: No focal lesion identified. Within normal limits in parenchymal echogenicity. IMPRESSION: Numerous gallstones are seen within a moderately distended appearing gallbladder with trace pericholecystic fluid. Findings raise concern for acute cholecystitis. Electronically Signed   By: Tollie Eth M.D.   On: 06/19/2016 22:26     EKG: Independently reviewed.  QTC 406, sinus rhythm, no ischemic change.  Assessment/Plan Principal Problem:   Cholelithiasis with acute cholecystitis Active Problems:   Human immunodeficiency virus (HIV) disease (HCC)   Depression   Schizophrenia (HCC)   HSV-1 (herpes simplex virus 1) infection   Hypokalemia   Cholelithiasis with acute cholecystitis: Ultrasound of RUQ showed multiple gallstones and possible acute cholecystitis. ALP 146, AST 221, ALT 404, total bilirubin 0.8. Currently patient does not have nausea, vomiting or abdominal pain. No fever or leukocytosis. Blood pressure is soft, but hemodynamically stable. ED PA, Jaynie Crumble, spoke with Dr. Carolynne Edouard with general surgery, who advised GI consult first. GI, Dr. Mounds Bing was consulted by EDP, but not sure if he will see pt in AM. Probably need to reconsult GI and general surgery morning.  -will admit to med-surg bed as inpt -IV Zosyn (patient received one dose of Rocephin in ED) -When necessary Zofran for nausea and morphine for pain -IV fluids: 2.5 L normal saline then 1 25 mL/h -INR/PTT/type & screen  HTN: Blood pressure is soft -Hold HCTZ -Continue amlodipine with holding parameters  HSV-1 and 2 infection: -continue valtrex  HIV: CD4 800, viral load less than 20 on 04/11/16. Never on any HIV medications per pt. f/u with Dr. Fran Lowes -check  CD4 and VL  Depression and schizophrenia [facility]): No SI or HI. -continue Wellbutrin, Invega  Hypokalemia: K= 3.3 on admission. - Repleted  DVT ppx: SCD Code Status: Full code Family Communication: None at bed side.    Disposition Plan:  Anticipate discharge back to previous home environment Consults called:  Gen. surgeon, Dr. Carolynne Edouard and GI, Dr. Madilyn Fireman Admission status: medical floor/inpt  Date of Service 06/20/2016    Lorretta Harp [facility] Pager [phone]  If 7PM-7AM, please contact night-coverage [email or web address] Password [email or web address] 06/20/2016, 6:46 AM

## 2016-06-20 NOTE — Progress Notes (Signed)
MRCP shows possible CBD stone (3mm) in distal duct.  Repeat LFT's not improved.  I feel pt should have ERCP, and I have discussed the nature, purpose and risks of that procedure in detail with the aid of a diagram, and pt is agreeable.  Risks include a roughly 1-5% chance of pancreatitis which can rarely be fatal, anesthesia problems, infection, bleeding, perforation, need for emergent corrective surgery.  Due to scheduling conflicts, have requested that Dr. Stan Head of [facility] group do the procedure, and he has kindly consented to do so.  This will allow the procedure to be done sometime tomorrow (exact time TBA), thereby expediting patient care.  Florencia Reasons, M.D. Pager [phone] If no answer or after 5 PM call [phone]

## 2016-06-20 NOTE — Progress Notes (Signed)
PROGRESS NOTE  Adriana Fernandez JXB:[id number] DOB: Feb 18, 1971 DOA: 06/19/2016 PCP: Jackie Plum, MD  Brief History:  46 year old female with a history of hypertension, HIV, paranoid schizophrenia, sickle cell trait presented with one-day history of abdominal pain that began on afternoon of June 19, 2016 with associated nausea and vomiting. The patient visited the emergency department more 06/13/2016 with same symptoms. She was discharged home in stable condition. She returned to the ED again yesterday 06/19/16 with abdominal pain after eating tacos with ground [place] and tomatoes. She has a history of gallstones was referred to surgery but is having difficulty getting into the office for evaluation. In the emergency department, the patient was afebrile and he mechanically stable but has increasing liver enzymes with AST 221, ALT 404, alkaline phosphatase 126, total bilirubin 0.8, lipase 60. Gastroenterology and general surgery were consulted to assist with management.  There was some concern of choledocholithiasis because of elevated bilirubin up to 1.4 at the time of admission. Abdominal ultrasound showed cholelithiasis with moderate gallbladder distention and  trace pericholecystic fluid concerning for cholecystitis.  Assessment/Plan: Symptomatic cholelithiasis/Choledocholithiasis -Concern about acute cholecystitis -Appreciate general surgery consult -Continue Zosyn -06/19/2016 abdominal ultrasound--cholelithiasis with GB distention and trace pericholecystic fluid concerning for acute cholecystitis -Appreciate GI consult--> MRCP to rule out choledocholithiasis -06/20/16 MRCP--> solitary 3 mm choledocholithiasis at the junction of CBD and amp along with mild dilated common bile duct; diffuse central intrahepatic biliary ductal dilatation; cholelithiasis with mild diffuse gallbladder wall thickening -NPO except ice chips for now pending work up [employer] IV fluids  HIV -likely  chronic non-progressor -04/11/2016--CD4--800/44%; HIV on an undetectable  Hypertension -Holding amlodipine and HCTZ secondary to soft blood pressure  Paranoid schizophrenia -Continue InVega  Depression -Continue Wellbutrin    Disposition Plan:   Home in 2-3 days  Family Communication:  No Family at bedside  Consultants:  [facility], General Surgery  Code Status:  FULL   DVT Prophylaxis:  SCDs   Procedures: As Listed in Progress Note Above  Antibiotics: Zosyn 4/25>>    Subjective: Patient is feeling a little bit better today. Her abdominal pain is better but still present. Denies any nausea, vomiting, diarrhea, fevers, chills, chest pain or chest breath. No headaches or neck pain.  Objective: Vitals:   06/19/16 2246 06/20/16 0045 06/20/16 0119 06/20/16 0231  BP: 100/74 110/62 96/68 (!) 96/56  Pulse: 64 81 74 (!) 59  Resp: Temp:   98.3 F (36.8 C) 98 F (36.7 C)  TempSrc: Oral  Oral Oral  SpO2: 100% 99% 99% 98%  Weight:      Height:        Intake/Output Summary (Last 24 hours) at 06/20/16 1021 Last data filed at 06/20/16 0600  Gross per 24 hour  Intake             3250 ml  Output                0 ml  Net             3250 ml   Weight change:  Exam:   General:  Pt is alert, follows commands appropriately, not in acute distress  HEENT: No icterus, No thrush, No neck mass, [place]/AT  Cardiovascular: RRR, S1/S2, no rubs, no gallops  Respiratory: CTA bilaterally, no wheezing, no crackles, no rhonchi  Abdomen: Soft/+BS, non tender, non distended, no guarding  Extremities: No edema, No lymphangitis, No petechiae, No rashes,  no synovitis   Data Reviewed: I have personally reviewed following labs and imaging studies Basic Metabolic Panel:  Recent Labs Lab 06/19/16 1923  NA 137  K 3.3*  CL 102  CO2 24  GLUCOSE 134*  BUN 9  CREATININE 0.76  CALCIUM 9.0   Liver Function Tests:  Recent Labs Lab 06/19/16 1923  AST 221*  ALT 404*    ALKPHOS 146*  BILITOT 0.8  PROT 7.8  ALBUMIN 3.9    Recent Labs Lab 06/19/16 1923  LIPASE 60*   No results for input(s): AMMONIA in the last 168 hours. Coagulation Profile:  Recent Labs Lab 06/20/16 0759  INR 1.04   CBC:  Recent Labs Lab 06/19/16 1923  WBC 6.5  NEUTROABS 4.5  HGB 12.9  HCT 37.1  MCV 84.3  PLT 254   Cardiac Enzymes: No results for input(s): CKTOTAL, CKMB, CKMBINDEX, TROPONINI in the last 168 hours. BNP: Invalid input(s): POCBNP CBG: No results for input(s): GLUCAP in the last 168 hours. HbA1C: No results for input(s): HGBA1C in the last 72 hours. Urine analysis:    Component Value Date/Time   COLORURINE YELLOW 06/19/2016 1919   APPEARANCEUR CLEAR 06/19/2016 1919   LABSPEC 1.016 06/19/2016 1919   PHURINE 7.0 06/19/2016 1919   GLUCOSEU NEGATIVE 06/19/2016 1919   HGBUR NEGATIVE 06/19/2016 1919   BILIRUBINUR NEGATIVE 06/19/2016 1919   KETONESUR NEGATIVE 06/19/2016 1919   PROTEINUR NEGATIVE 06/19/2016 1919   UROBILINOGEN 1 11/09/2012 1006   NITRITE NEGATIVE 06/19/2016 1919   LEUKOCYTESUR NEGATIVE 06/19/2016 1919   Sepsis Labs: (procalcitonin:4,lacticidven:4) )No results found for this or any previous visit (from the past 240 hour(s)).   Scheduled Meds: . amLODipine  5 mg Oral Daily  . buPROPion  150 mg Oral BID  . dicyclomine  20 mg Oral BID  . paliperidone  3 mg Oral Daily  . valACYclovir  1,000 mg Oral Daily   Continuous Infusions: . piperacillin-tazobactam (ZOSYN)  IV 3.375 g (06/20/16 0543)    Procedures/Studies: Dg Abdomen Acute W/chest  Result Date: 06/13/2016 CLINICAL DATA:  Sudden onset of lower abdominal pain.  Vomiting. EXAM: DG ABDOMEN ACUTE W/ 1V CHEST COMPARISON:  Radiographs 05/04/2016 FINDINGS: The cardiomediastinal contours are normal. The lungs are clear. There is no free intra-abdominal air. No dilated bowel loops to suggest obstruction. Small-moderate volume of stool throughout the colon. No radiopaque  calculi. Right pelvic phleboliths again seen. IUD in the pelvis. No acute osseous abnormalities are seen. IMPRESSION: 1. Normal bowel gas pattern. No evidence of bowel obstruction or free air. 2. Clear lungs. Electronically Signed   By: Rubye Oaks M.D.   On: 06/13/2016 03:10   US Abdomen Limited Ruq  Result Date: 06/19/2016 CLINICAL DATA:  Intermittent right upper quadrant pain times several months, latest episode x5 hours. EXAM: US ABDOMEN LIMITED - RIGHT UPPER QUADRANT COMPARISON:  None. FINDINGS: Gallbladder: Numerous layering gallstones are seen within the gallbladder and at the neck of the gallbladder as well the largest measuring 1.1 cm. Gallbladder wall is top normal at 2.5 mm in thickness. Trace pericholecystic edema and fluid. No sonographic Murphy's however the patient was reportedly on pain medication. Common bile duct: Diameter: 7 mm.  No choledocholithiasis. Liver: No focal lesion identified. Within normal limits in parenchymal echogenicity. IMPRESSION: Numerous gallstones are seen within a moderately distended appearing gallbladder with trace pericholecystic fluid. Findings raise concern for acute cholecystitis. Electronically Signed   By: Tollie Eth M.D.   On: 06/19/2016 22:26    Montrice Gracey, DO  [facility] Pager [phone]  If 7PM-7AM, please contact night-coverage [email or web address] Password [email or web address] 06/20/2016, 10:21 AM   LOS: 0 days

## 2016-06-20 NOTE — Care Management Note (Signed)
Case Management Note  Patient Details  Name: AISIA CORREIRA MRN: [id number] Date of Birth: 10-06-1970  Subjective/Objective:      Acute cholecystisis via xray.              Action/Plan: Date:  June 20, 2016 Chart reviewed for concurrent status and case management needs. Will continue to follow patient progress. Discharge Planning: following for needs Expected discharge date: [id number] Marcelle Smiling, BSN, [place], [place]   [phone]  Expected Discharge Date:  06/22/16               Expected Discharge Plan:  Home/Self Care  In-House Referral:     Discharge planning Services     Post Acute Care Choice:    Choice offered to:     DME Arranged:    DME Agency:     HH Arranged:    HH Agency:     Status of Service:  In process, will continue to follow  If discussed at Long Length of Stay Meetings, dates discussed:    Additional Comments:  Golda Acre, RN 06/20/2016, 10:41 AM

## 2016-06-20 NOTE — Consult Note (Signed)
Reason for Consult:  Cholelithiasis, nausea and vomiting Referring Physician: Dr. Shanon Brow Tat  Adriana Fernandez is an 46 y.o. female.   Chief complaint:  Abdomina pain  HPI: Patient seen in one of our emergency departments on 06/13/16, with abdominal pain nausea vomiting. Discharged home. She had same issue 05/13/16 and had a GB ultrasound at that time.   She returned to the ED again yesterday 06/19/16 with abdominal pain after eating tacos with ground [place] and tomatoes. She has a history of gallstones was referred to surgery but is having difficulty getting into the office for evaluation.  She reports abdominal pain that started around 3:45 PM in the mid abdomen was constant 10/10 severity. Burning nonradiating. She had nausea and vomiting with this. She vomited 5 times before she presented to the ED for evaluation. No diarrhea, no fever or chills. Patient denies shortness of breath or chest pain coughing symptoms improved in the ED after Zofran and IV Dilaudid.  Workup in the ED, MAXIMUM TEMPERATURE 99.8. Vital signs are stable. Bilirubin was 1.4. ALT 81 AST 109. Glucose 147 NA 134. Urine pregnancy was negative. She has an ultrasound from 05/04/16 which showed cholelithiasis but no sonographic findings to suggest acute cholecystitis. There was an 8 mm benign hemangioma in the right hepatic lobe and a 9 mm cyst in the upper pole of the right kidney. Repeat study yesterday shows numerous layering gallstones seen within the gallbladder at the neck of the gallbladder. The largest measuring 1.1 cm. Gallbladder wall is at the top normal thickness at 2.5 mm. There is a trace of pericholecystic edema and fluid. There is no sonographic Murphy's but the patient was on pain medication. CBD was 7 mm. Patient was admitted with a possible cholelithiasis/cholecystitis.  She's been seen by GI, Dr. Cristina Gong he has ordered an MRI and we are asked to see in consultation.  Patient has a history of significant  hypertension, depression, sickle cell trait, schizophrenia, psychosis, paranoid, HIV bracket CD4 800, viral load less than 20 on 04/01/16] non-medications for HIV. History of HSV-1 and 2.Her HIV is followed by Dr. Edmon Crape and she does not require HIV drug Rx at this time.    Past Medical History:  Diagnosis Date  . Depression   . Elevated LFTs   . Gallstones   . HIV disease ([place])   . 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 Hypertension   . Paranoid schizophrenia ([place])   . Psychosis   . Schizophrenia ([place])   . Sickle cell trait ([place])     History reviewed. No pertinent surgical history.  Family History  Problem Relation Age of Onset  . GER disease Mother   . Hypertension Mother   . Sickle cell anemia Brother   . Diabetes Maternal Aunt   . Diabetes Maternal Grandmother   . Breast cancer Maternal Aunt   . Throat cancer Paternal Grandmother     Social History:  reports that she has never smoked. She has never used smokeless tobacco. She reports that she does not drink alcohol or use drugs.  Allergies:  Allergies  Allergen Reactions  . Latex     Medications:  Prior to Admission:  Prescriptions Prior to Admission  Medication Sig Dispense Refill Last Dose  . amLODipine (NORVASC) 5 MG tablet Take 1 tablet (5 mg total) by mouth daily. 30 tablet 0 Past Week at Unknown time  . buPROPion (WELLBUTRIN SR) 150 MG 12  hr tablet Take 1 tablet (150 mg total) by mouth 2 (two) times daily. 60 tablet 0 06/19/2016 at Unknown time  . dicyclomine (BENTYL) 20 MG tablet Take 1 tablet (20 mg total) by mouth 2 (two) times daily. 20 tablet 0 Past Week at Unknown time  . hydrochlorothiazide (HYDRODIURIL) 25 MG tablet Take 1 tablet (25 mg total) by mouth daily. 30 tablet 0 Past Week at Unknown time  . ibuprofen (ADVIL,MOTRIN) 600 MG tablet Take 1 tablet (600 mg total) by mouth every 6 (six) hours as needed. (Patient taking differently:  Take 600 mg by mouth every 6 (six) hours as needed for headache. ) 30 tablet 0 Past Month at Unknown time  . ondansetron (ZOFRAN ODT) 8 MG disintegrating tablet 48m ODT q8 hours prn nausea (Patient taking differently: Take 8 mg by mouth every 8 (eight) hours as needed for nausea or vomiting. 839mODT q8 hours prn nausea) 12 tablet 0 Past Month at Unknown time  . paliperidone (INVEGA) 3 MG 24 hr tablet Take 3 mg by mouth daily.    06/19/2016 at Unknown time  . traMADol (ULTRAM) 50 MG tablet Take 1 tablet (50 mg total) by mouth every 6 (six) hours as needed. (Patient taking differently: Take 50 mg by mouth every 6 (six) hours as needed for moderate pain. ) 15 tablet 0 Past Month at Unknown time  . valACYclovir (VALTREX) 1000 MG tablet Take 1,000 mg by mouth daily.   06/19/2016 at Unknown time    Results for orders placed or performed during the hospital encounter of 06/19/16 (from the past 48 hour(s))  Pregnancy, urine     Status: None   Collection Time: 06/19/16  7:19 PM  Result Value Ref Range   Preg Test, Ur NEGATIVE NEGATIVE    Comment:        THE SENSITIVITY OF THIS METHODOLOGY IS >20 mIU/mL.   Urinalysis, Routine w reflex microscopic     Status: None   Collection Time: 06/19/16  7:19 PM  Result Value Ref Range   Color, Urine YELLOW YELLOW   APPearance CLEAR CLEAR   Specific Gravity, Urine 1.016 1.005 - 1.030   pH 7.0 5.0 - 8.0   Glucose, UA NEGATIVE NEGATIVE mg/dL   Hgb urine dipstick NEGATIVE NEGATIVE   Bilirubin Urine NEGATIVE NEGATIVE   Ketones, ur NEGATIVE NEGATIVE mg/dL   Protein, ur NEGATIVE NEGATIVE mg/dL   Nitrite NEGATIVE NEGATIVE   Leukocytes, UA NEGATIVE NEGATIVE    Comment: Microscopic not done on urines with negative protein, blood, leukocytes, nitrite, or glucose < 500 mg/dL.  CBC with Differential     Status: None   Collection Time: 06/19/16  7:23 PM  Result Value Ref Range   WBC 6.5 4.0 - 10.5 K/uL   RBC 4.40 3.87 - 5.11 MIL/uL   Hemoglobin 12.9 12.0 - 15.0 g/dL    HCT 37.1 36.0 - 46.0 %   MCV 84.3 78.0 - 100.0 fL   MCH 29.3 26.0 - 34.0 pg   MCHC 34.8 30.0 - 36.0 g/dL   RDW 13.7 11.5 - 15.5 %   Platelets 254 150 - 400 K/uL   Neutrophils Relative % 69 %   Neutro Abs 4.5 1.7 - 7.7 K/uL   Lymphocytes Relative 19 %   Lymphs Abs 1.3 0.7 - 4.0 K/uL   Monocytes Relative 11 %   Monocytes Absolute 0.7 0.1 - 1.0 K/uL   Eosinophils Relative 1 %   Eosinophils Absolute 0.0 0.0 - 0.7 K/uL   Basophils  Relative 0 %   Basophils Absolute 0.0 0.0 - 0.1 K/uL  Comprehensive metabolic panel     Status: Abnormal   Collection Time: 06/19/16  7:23 PM  Result Value Ref Range   Sodium 137 135 - 145 mmol/L   Potassium 3.3 (L) 3.5 - 5.1 mmol/L   Chloride 102 101 - 111 mmol/L   CO2 24 22 - 32 mmol/L   Glucose, Bld 134 (H) 65 - 99 mg/dL   BUN 9 6 - 20 mg/dL   Creatinine, Ser 0.76 0.44 - 1.00 mg/dL   Calcium 9.0 8.9 - 10.3 mg/dL   Total Protein 7.8 6.5 - 8.1 g/dL   Albumin 3.9 3.5 - 5.0 g/dL   AST 221 (H) 15 - 41 U/L   ALT 404 (H) 14 - 54 U/L   Alkaline Phosphatase 146 (H) 38 - 126 U/L   Total Bilirubin 0.8 0.3 - 1.2 mg/dL   GFR calc non Af Amer >60 >60 mL/min   GFR calc Af Amer >60 >60 mL/min    Comment: (NOTE) The eGFR has been calculated using the CKD EPI equation. This calculation has not been validated in all clinical situations. eGFR's persistently <60 mL/min signify possible Chronic Kidney Disease.    Anion gap 11 5 - 15  Lipase, blood     Status: Abnormal   Collection Time: 06/19/16  7:23 PM  Result Value Ref Range   Lipase 60 (H) 11 - 51 U/L  Protime-INR     Status: None   Collection Time: 06/20/16  7:59 AM  Result Value Ref Range   Prothrombin Time 13.7 11.4 - 15.2 seconds   INR 1.04   APTT     Status: None   Collection Time: 06/20/16  7:59 AM  Result Value Ref Range   aPTT 31 24 - 36 seconds  Type and screen [place]     Status: None (Preliminary result)   Collection Time: 06/20/16  7:59 AM  Result Value Ref Range    ABO/RH(D) A POS    Antibody Screen PENDING    Sample Expiration 06/23/2016     US Abdomen Limited Ruq  Result Date: 06/19/2016 CLINICAL DATA:  Intermittent right upper quadrant pain times several months, latest episode x5 hours. EXAM: US ABDOMEN LIMITED - RIGHT UPPER QUADRANT COMPARISON:  None. FINDINGS: Gallbladder: Numerous layering gallstones are seen within the gallbladder and at the neck of the gallbladder as well the largest measuring 1.1 cm. Gallbladder wall is top normal at 2.5 mm in thickness. Trace pericholecystic edema and fluid. No sonographic Murphy's however the patient was reportedly on pain medication. Common bile duct: Diameter: 7 mm.  No choledocholithiasis. Liver: No focal lesion identified. Within normal limits in parenchymal echogenicity. IMPRESSION: Numerous gallstones are seen within a moderately distended appearing gallbladder with trace pericholecystic fluid. Findings raise concern for acute cholecystitis. Electronically Signed   By: Ashley Royalty M.D.   On: 06/19/2016 22:26    Review of Systems  Constitutional: Negative.   HENT: Negative.   Eyes: Negative.   Respiratory: Negative.   Cardiovascular: Negative.   Gastrointestinal: Positive for abdominal pain, nausea and vomiting. Negative for blood in stool, constipation, diarrhea, heartburn and melena.  Genitourinary: Negative.   Musculoskeletal: Negative.   Skin: Negative.   Neurological: Negative.   Endo/Heme/Allergies: Negative.   Psychiatric/Behavioral: Negative.        She takes her medicines on time and has not had any issues while taking medicines.     Blood pressure Marland Kitchen)  96/56, pulse (!) 59, temperature 98 F (36.7 C), temperature source Oral, resp. rate 16, height _0  (1.702 m), weight 68 kg (150 lb), SpO2 98 %. Physical Exam  Constitutional: She is oriented to person, place, and time. She appears well-developed and well-nourished. No distress.  HENT:  Head: Normocephalic and atraumatic.  Mouth/Throat:  Oropharyngeal exudate present.  Eyes: Right eye exhibits no discharge. Left eye exhibits no discharge. No scleral icterus.  Pupils equal  Neck: Normal range of motion. No JVD present. No tracheal deviation present. No thyromegaly present.  Cardiovascular: Normal rate, regular rhythm, normal heart sounds and intact distal pulses.  Exam reveals no gallop.   No murmur heard. Respiratory: Effort normal and breath sounds normal. No respiratory distress. She has no wheezes. She has no rales. She exhibits no tenderness.  GI: Soft. Bowel sounds are normal. She exhibits no distension and no mass. There is no tenderness. There is no rebound and no guarding.  She is completely pain free now.  When she has pain it is centered around the umbilicus, med abdomen.    Genitourinary:  Genitourinary Comments: Mirena IUD  No periords  Musculoskeletal: She exhibits no edema or tenderness.  Lymphadenopathy:    She has no cervical adenopathy.  Neurological: She is alert and oriented to person, place, and time. No cranial nerve deficit.  Skin: Skin is warm and dry. No rash noted. She is not diaphoretic. No erythema. No pallor.  Psychiatric: She has a normal mood and affect. Her behavior is normal. Judgment and thought content normal.    Assessment/Plan: Cholelithiasis with recurrent nausea, vomiting and abdominal pain HIV  (CD 4> 800, viral load low)  Dr. Tommy Medal - not on medications HSV 1/HSV 2 Hypertension Hx of Schizophrenia/psychosis/paranoid  - controlled on Medications Hx of Sickle cell trait  Plan:  She is getting an MRI today.  She is pain free this AM.  Sounds like pain is in defined periods and then resolves completely till next episode. ? Passing stones ?    JENNINGS,WILLARD 06/20/2016, 9:07 AM   Agree with above. Discussed with Dr. Cristina Gong.  MRCP positive for CSD stone.  Dr. Carlean Purl to do ERCP tomorrow. Then probably do lap chole on 4/28  Alphonsa Overall, MD, [facility]  Surgery Pager: [phone] Office phone:  [phone]

## 2016-06-21 ENCOUNTER — Encounter (HOSPITAL_COMMUNITY): Payer: Self-pay | Admitting: Certified Registered"

## 2016-06-21 ENCOUNTER — Inpatient Hospital Stay (HOSPITAL_COMMUNITY): Payer: Self-pay | Admitting: Certified Registered"

## 2016-06-21 ENCOUNTER — Encounter (HOSPITAL_COMMUNITY)
Admission: EM | Disposition: A | Discharge status: Home / Self Care | Payer: Self-pay | Source: Home / Self Care | Attending: Internal Medicine

## 2016-06-21 ENCOUNTER — Inpatient Hospital Stay (HOSPITAL_COMMUNITY): Payer: Self-pay

## 2016-06-21 DIAGNOSIS — K8041 Calculus of bile duct with cholecystitis, unspecified, with obstruction: Secondary | ICD-10-CM

## 2016-06-21 DIAGNOSIS — K8001 Calculus of gallbladder with acute cholecystitis with obstruction: Secondary | ICD-10-CM

## 2016-06-21 DIAGNOSIS — K8050 Calculus of bile duct without cholangitis or cholecystitis without obstruction: Secondary | ICD-10-CM

## 2016-06-21 HISTORY — PX: ERCP: SHX5425

## 2016-06-21 LAB — COMPREHENSIVE METABOLIC PANEL
ALT: 312 U/L — ABNORMAL HIGH (ref 14–54)
ALT: 349 U/L — ABNORMAL HIGH (ref 14–54)
AST: 80 U/L — ABNORMAL HIGH (ref 15–41)
AST: 94 U/L — ABNORMAL HIGH (ref 15–41)
Albumin: 3.2 g/dL — ABNORMAL LOW (ref 3.5–5.0)
Albumin: 3.5 g/dL (ref 3.5–5.0)
Alkaline Phosphatase: 128 U/L — ABNORMAL HIGH (ref 38–126)
Alkaline Phosphatase: 140 U/L — ABNORMAL HIGH (ref 38–126)
Anion gap: 6 (ref 5–15)
Anion gap: 8 (ref 5–15)
BUN: <5 mg/dL — ABNORMAL LOW (ref 6–20)
BUN: 5 mg/dL — ABNORMAL LOW (ref 6–20)
CO2: 23 mmol/L (ref 22–32)
CO2: 22 mmol/L (ref 22–32)
Calcium: 8.8 mg/dL — ABNORMAL LOW (ref 8.9–10.3)
Calcium: 9 mg/dL (ref 8.9–10.3)
Chloride: 111 mmol/L (ref 101–111)
Chloride: 108 mmol/L (ref 101–111)
Creatinine, Ser: 0.63 mg/dL (ref 0.44–1.00)
Creatinine, Ser: 0.73 mg/dL (ref 0.44–1.00)
GFR calc Af Amer: >60 mL/min (ref >60)
GFR calc Af Amer: >60 mL/min (ref >60)
GFR calc non Af Amer: >60 mL/min (ref >60)
GFR calc non Af Amer: >60 mL/min (ref >60)
Glucose, Bld: 86 mg/dL (ref 65–99)
Glucose, Bld: 119 mg/dL — ABNORMAL HIGH (ref 65–99)
Potassium: 4.2 mmol/L (ref 3.5–5.1)
Potassium: 4.5 mmol/L (ref 3.5–5.1)
Sodium: 140 mmol/L (ref 135–145)
Sodium: 138 mmol/L (ref 135–145)
Total Bilirubin: 0.6 mg/dL (ref 0.3–1.2)
Total Bilirubin: 0.5 mg/dL (ref 0.3–1.2)
Total Protein: 6.2 g/dL — ABNORMAL LOW (ref 6.5–8.1)
Total Protein: 7.1 g/dL (ref 6.5–8.1)

## 2016-06-21 LAB — CBC WITH DIFFERENTIAL/PLATELET
Basophils Absolute: 0 10*3/uL (ref 0.0–0.1)
Basophils Relative: 0 %
Eosinophils Absolute: 0.1 10*3/uL (ref 0.0–0.7)
Eosinophils Relative: 2 %
HCT: 32.1 % — ABNORMAL LOW (ref 36.0–46.0)
Hemoglobin: 11.2 g/dL — ABNORMAL LOW (ref 12.0–15.0)
Lymphocytes Relative: 45 %
Lymphs Abs: 1.8 10*3/uL (ref 0.7–4.0)
MCH: 29.6 pg (ref 26.0–34.0)
MCHC: 34.9 g/dL (ref 30.0–36.0)
MCV: 84.7 fL (ref 78.0–100.0)
Monocytes Absolute: 0.4 10*3/uL (ref 0.1–1.0)
Monocytes Relative: 9 %
Neutro Abs: 1.8 10*3/uL (ref 1.7–7.7)
Neutrophils Relative %: 44 %
Platelets: 222 10*3/uL (ref 150–400)
RBC: 3.79 MIL/uL — ABNORMAL LOW (ref 3.87–5.11)
RDW: 14.1 % (ref 11.5–15.5)
WBC: 4 10*3/uL (ref 4.0–10.5)

## 2016-06-21 LAB — HIV-1 RNA QUANT-NO REFLEX-BLD
HIV 1 RNA Quant: <20 copies/mL
LOG10 HIV-1 RNA: UNDETERMINED log10copy/mL

## 2016-06-21 LAB — PROTIME-INR
INR: 1.11
Prothrombin Time: 14.4 seconds (ref 11.4–15.2)

## 2016-06-21 LAB — T-HELPER CELLS (CD4) COUNT (NOT AT ARMC)
CD4 % Helper T Cell: 46 % (ref 33–55)
CD4 T Cell Abs: 700 /uL (ref 400–2700)

## 2016-06-21 SURGERY — ERCP, WITH INTERVENTION IF INDICATED
Anesthesia: General

## 2016-06-21 MED ORDER — ONDANSETRON HCL 4 MG/2ML IJ SOLN
INTRAMUSCULAR | Status: AC
  Filled 2016-06-21: qty 2

## 2016-06-21 MED ORDER — SUCCINYLCHOLINE CHLORIDE 200 MG/10ML IV SOSY
PREFILLED_SYRINGE | INTRAVENOUS | Status: DC | PRN
  Administered 2016-06-21: 120 mg via INTRAVENOUS

## 2016-06-21 MED ORDER — LACTATED RINGERS IV SOLN
INTRAVENOUS | Status: DC
  Administered 2016-06-21: 07:00:00 via INTRAVENOUS

## 2016-06-21 MED ORDER — FENTANYL CITRATE (PF) 100 MCG/2ML IJ SOLN
INTRAMUSCULAR | Status: DC | PRN
  Administered 2016-06-21: 100 ug via INTRAVENOUS

## 2016-06-21 MED ORDER — MIDAZOLAM HCL 2 MG/2ML IJ SOLN
INTRAMUSCULAR | Status: AC
  Filled 2016-06-21: qty 2

## 2016-06-21 MED ORDER — SODIUM CHLORIDE 0.9 % IV SOLN
INTRAVENOUS | Status: DC | PRN
  Administered 2016-06-21: 90 mL

## 2016-06-21 MED ORDER — FENTANYL CITRATE (PF) 250 MCG/5ML IJ SOLN
INTRAMUSCULAR | Status: AC
  Filled 2016-06-21: qty 5

## 2016-06-21 MED ORDER — PROPOFOL 10 MG/ML IV BOLUS
INTRAVENOUS | Status: DC | PRN
  Administered 2016-06-21: 160 mg via INTRAVENOUS

## 2016-06-21 MED ORDER — INDOMETHACIN 50 MG RE SUPP
RECTAL | Status: AC
  Filled 2016-06-21: qty 2

## 2016-06-21 MED ORDER — INDOMETHACIN 50 MG RE SUPP: 100.0000 mg | Freq: Once | RECTAL | Status: DC

## 2016-06-21 MED ORDER — LIDOCAINE 2% (20 MG/ML) 5 ML SYRINGE
INTRAMUSCULAR | Status: DC | PRN
  Administered 2016-06-21: 50 mg via INTRAVENOUS
  Administered 2016-06-21: 100 mg via INTRAVENOUS

## 2016-06-21 MED ORDER — CHLORHEXIDINE GLUCONATE CLOTH 2 % EX PADS: 6.0000 | MEDICATED_PAD | Freq: Once | CUTANEOUS | Status: DC

## 2016-06-21 MED ORDER — PHENYLEPHRINE 40 MCG/ML (10ML) SYRINGE FOR IV PUSH (FOR BLOOD PRESSURE SUPPORT)
PREFILLED_SYRINGE | INTRAVENOUS | Status: AC
  Filled 2016-06-21: qty 10

## 2016-06-21 MED ORDER — INDOMETHACIN 50 MG RE SUPP
RECTAL | Status: DC | PRN
  Administered 2016-06-21: 100 mg via RECTAL

## 2016-06-21 MED ORDER — ONDANSETRON HCL 4 MG/2ML IJ SOLN
INTRAMUSCULAR | Status: DC | PRN
  Administered 2016-06-21: 4 mg via INTRAVENOUS

## 2016-06-21 MED ORDER — MIDAZOLAM HCL 5 MG/5ML IJ SOLN
INTRAMUSCULAR | Status: DC | PRN
  Administered 2016-06-21: 2 mg via INTRAVENOUS

## 2016-06-21 MED ORDER — PHENYLEPHRINE 40 MCG/ML (10ML) SYRINGE FOR IV PUSH (FOR BLOOD PRESSURE SUPPORT)
PREFILLED_SYRINGE | INTRAVENOUS | Status: DC | PRN
  Administered 2016-06-21 (×2): 80 ug via INTRAVENOUS

## 2016-06-21 MED ORDER — GLUCAGON HCL RDNA (DIAGNOSTIC) 1 MG IJ SOLR
INTRAMUSCULAR | Status: AC
  Filled 2016-06-21: qty 1

## 2016-06-21 MED ORDER — CHLORHEXIDINE GLUCONATE CLOTH 2 % EX PADS
6.0000 | MEDICATED_PAD | Freq: Once | CUTANEOUS | Status: AC
  Administered 2016-06-21: 6 via TOPICAL

## 2016-06-21 MED ORDER — PROPOFOL 10 MG/ML IV BOLUS
INTRAVENOUS | Status: AC
  Filled 2016-06-21: qty 20

## 2016-06-21 NOTE — Interval H&P Note (Signed)
History and Physical Interval Note:  06/21/2016 7:29 AM  Adriana Fernandez  has presented today for surgery, with the diagnosis of Suspected Common bile duct stone  The various methods of treatment have been discussed with the patient and family. After consideration of risks, benefits and other options for treatment, the patient has consented to  Procedure(s): ENDOSCOPIC RETROGRADE CHOLANGIOPANCREATOGRAPHY (ERCP) (N/A) as a surgical intervention .  The patient's history has been reviewed, patient examined, no change in status, stable for surgery.  I have reviewed the patient's chart and labs.  Questions were answered to the patient's satisfaction.     Stan Head

## 2016-06-21 NOTE — Interval H&P Note (Signed)
History and Physical Interval Note:  06/21/2016 7:28 AM  Adriana Fernandez  has presented today for surgery, with the diagnosis of Suspected Common bile duct stone  The various methods of treatment have been discussed with the patient and family. After consideration of risks, benefits and other options for treatment, the patient has consented to  Procedure(s): ENDOSCOPIC RETROGRADE CHOLANGIOPANCREATOGRAPHY (ERCP) (N/A) as a surgical intervention .  The patient's history has been reviewed, patient examined, no change in status, stable for surgery.  I have reviewed the patient's chart and labs.  Questions were answered to the patient's satisfaction.     Stan Head

## 2016-06-21 NOTE — Transfer of Care (Signed)
Immediate Anesthesia Transfer of Care Note  Patient: Adriana Fernandez  Procedure(s) Performed: Procedure(s): ENDOSCOPIC RETROGRADE CHOLANGIOPANCREATOGRAPHY (ERCP) (N/A)  Patient Location: PACU  Anesthesia Type:General  Level of Consciousness: awake, alert  and oriented  Airway & Oxygen Therapy: Patient Spontanous Breathing and Patient connected to face mask oxygen  Post-op Assessment: Report given to RN and Post -op Vital signs reviewed and stable  Post vital signs: Reviewed and stable  Last Vitals:  Vitals:   06/21/16 0548 06/21/16 0729  BP: (!) 103/59 (!) 155/0  Pulse: 60 61  Resp: 17 20  Temp: 37.2 C     Last Pain:  Vitals:   06/21/16 0548  TempSrc: Oral  PainSc:       Patients Stated Pain Goal: 2 (06/20/16 2017)  Complications: No apparent anesthesia complications

## 2016-06-21 NOTE — Interval H&P Note (Signed)
History and Physical Interval Note:  06/21/2016 7:28 AM  Adriana Fernandez  has presented today for surgery, with the diagnosis of Suspected Common bile duct stone  The various methods of treatment have been discussed with the patient and family. After consideration of risks, benefits and other options for treatment, the patient has consented to  Procedure(s): ENDOSCOPIC RETROGRADE CHOLANGIOPANCREATOGRAPHY (ERCP) (N/A) as a surgical intervention .  The patient's history has been reviewed, patient examined, no change in status, stable for surgery.  I have reviewed the patient's chart and labs.  Questions were answered to the patient's satisfaction.     Johne Buckle   

## 2016-06-21 NOTE — Discharge Instructions (Signed)
°LAPAROSCOPIC SURGERY: POST OP INSTRUCTIONS  °1. DIET: Follow a light bland diet the first 24 hours after arrival home, such as soup, liquids, crackers, etc. Be sure to include lots of fluids daily. Avoid fast food or heavy meals as your are more likely to get nauseated. Eat a low fat the next few days after surgery.  °2. Take your usually prescribed home medications unless otherwise directed. °3. PAIN CONTROL:  °1. Pain is best controlled by a usual combination of three different methods TOGETHER:  °1. Ice/Heat °2. Over the counter pain medication °3. Prescription pain medication °2. Most patients will experience some swelling and bruising around the incisions. Ice packs or heating pads (30-60 minutes up to 6 times a day) will help. Use ice for the first few days to help decrease swelling and bruising, then switch to heat to help relax tight/sore spots and speed recovery. Some people prefer to use ice alone, heat alone, alternating between ice & heat. Experiment to what works for you. Swelling and bruising can take several weeks to resolve.  °3. It is helpful to take an over-the-counter pain medication regularly for the first few weeks. Choose one of the following that works best for you:  °1. Naproxen (Aleve, etc) Two 220mg tabs twice a day °2. Ibuprofen (Advil, etc) Three 200mg tabs four times a day (every meal & bedtime) °3. Acetaminophen (Tylenol, etc) 500-650mg four times a day (every meal & bedtime) °4. A prescription for pain medication (such as oxycodone, hydrocodone, etc) should be given to you upon discharge. Take your pain medication as prescribed.  °1. If you are having problems/concerns with the prescription medicine (does not control pain, nausea, vomiting, rash, itching, etc), please call us [phone] to see if we need to switch you to a different pain medicine that will work better for you and/or control your side effect better. °2. If you need a refill on your pain medication, please contact  your pharmacy. They will contact our office to request authorization. Prescriptions will not be filled after 5 pm or on week-ends. °4. Avoid getting constipated. Between the surgery and the pain medications, it is common to experience some constipation. Increasing fluid intake and taking a fiber supplement (such as Metamucil, Citrucel, FiberCon, MiraLax, etc) 1-2 times a day regularly will usually help prevent this problem from occurring. A mild laxative (prune juice, Milk of Magnesia, MiraLax, etc) should be taken according to package directions if there are no bowel movements after 48 hours.  °5. Watch out for diarrhea. If you have many loose bowel movements, simplify your diet to bland foods & liquids for a few days. Stop any stool softeners and decrease your fiber supplement. Switching to mild anti-diarrheal medications (Kayopectate, Pepto Bismol) can help. If this worsens or does not improve, please call us. °6. Wash / shower every day. You may shower over the dressings as they are waterproof. Continue to shower over incision(s) after the dressing is off. If there is glue over the incisions try not to pick it off, let it fall off naturally. °7. Remove your waterproof bandages 2 days after surgery. You may leave the incision open to air. You may replace a dressing/Band-Aid to cover the incision for comfort if you wish.  °8. ACTIVITIES as tolerated:  °1. You may resume regular (light) daily activities beginning the next day--such as daily self-care, walking, climbing stairs--gradually increasing activities as tolerated. If you can walk 30 minutes without difficulty, it is safe to try more intense activity   such as jogging, treadmill, bicycling, low-impact aerobics, swimming, etc. °2. Save the most intensive and strenuous activity for last such as sit-ups, heavy lifting, contact sports, etc Refrain from any heavy lifting or straining until you are off narcotics for pain control. For the first 2-3 weeks do not lift  over 10-15lb.  °3. DO NOT PUSH THROUGH PAIN. Let pain be your guide: If it hurts to do something, don't do it. Pain is your body warning you to avoid that activity for another week until the pain goes down. °4. You may drive when you are no longer taking prescription pain medication, you can comfortably wear a seatbelt, and you can safely maneuver your car and apply brakes. °5. You may have sexual intercourse when it is comfortable.  °9. FOLLOW UP in our office  °1. Please call [facility] at [phone] to set up an appointment to see your surgeon in the office for a follow-up appointment approximately 2-3 weeks after your surgery. °2. Make sure that you call for this appointment the day you arrive home to insure a convenient appointment time. °     10. IF YOU HAVE DISABILITY OR FAMILY LEAVE FORMS, BRING THEM TO THE               OFFICE FOR PROCESSING.  ° °WHEN TO CALL US [phone]:  °1. Poor pain control °2. Reactions / problems with new medications (rash/itching, nausea, etc)  °3. Fever over 101.5 F (38.5 C) °4. Inability to urinate °5. Nausea and/or vomiting °6. Worsening swelling or bruising °7. Continued bleeding from incision. °8. Increased pain, redness, or drainage from the incision ° °The clinic staff is available to answer your questions during regular business hours (8:30am-5pm). Please don’t hesitate to call and ask to speak to one of our nurses for clinical concerns.  °If you have a medical emergency, go to the nearest emergency room or call 911.  °A surgeon from [facility] is always on call at the hospitals  ° °[facility]  °[address], [address], [place], [place] [address] ?  °MAIN: [phone] ? TOLL FREE: [phone] ?  °FAX [phone]  °[email or web address] ° ° °

## 2016-06-21 NOTE — Anesthesia Procedure Notes (Signed)
Procedure Name: Intubation Date/Time: 06/21/2016 7:41 AM Performed by: Noralyn Pick D Pre-anesthesia Checklist: Patient identified, Emergency Drugs available, Suction available and Patient being monitored Patient Re-evaluated:Patient Re-evaluated prior to inductionOxygen Delivery Method: Circle system utilized Preoxygenation: Pre-oxygenation with 100% oxygen Intubation Type: IV induction Ventilation: Mask ventilation without difficulty Laryngoscope Size: Mac and 4 Grade View: Grade I Tube type: Oral Number of attempts: 1 Airway Equipment and Method: Stylet Placement Confirmation: ETT inserted through vocal cords under direct vision,  positive ETCO2 and breath sounds checked- equal and bilateral Secured at: 21 cm Tube secured with: Tape Dental Injury: Teeth and Oropharynx as per pre-operative assessment

## 2016-06-21 NOTE — Op Note (Signed)
[facility] Patient Name: Adriana Fernandez Procedure Date: 06/21/2016 MRN: [id number] Attending MD: Iva Boop , MD Date of Birth: 02/18/1971 CSN: [id number] Age: 46 Admit Type: Inpatient Procedure:                ERCP Indications:              Suspected bile duct stone(s) Providers:                Iva Boop, MD, Dwain Sarna, RN, Kandice Robinsons, Technician, Anthony Sar, RN Referring MD:              Medicines:                General Anesthesia, On Zosyn Complications:            No immediate complications. Estimated Blood Loss:     Estimated blood loss: none. Procedure:                Pre-Anesthesia Assessment:                           - Prior to the procedure, a History and Physical                            was performed, and patient medications and                            allergies were reviewed. The patient's tolerance of                            previous anesthesia was also reviewed. The risks                            and benefits of the procedure and the sedation                            options and risks were discussed with the patient.                            All questions were answered, and informed consent                            was obtained. Prior Anticoagulants: The patient has                            taken no previous anticoagulant or antiplatelet                            agents. ASA Grade Assessment: II - A patient with                            mild systemic disease. After reviewing the risks  and benefits, the patient was deemed in                            satisfactory condition to undergo the procedure.                           After obtaining informed consent, the scope was                            passed under direct vision. Throughout the                            procedure, the patient's blood pressure, pulse, and                            oxygen  saturations were monitored continuously. The                            ZO-[id number] ([id number]) scope was introduced through                            the mouth, and used to inject contrast into and                            used to inject contrast into the bile duct. Scope In: Scope Out: Findings:      The scout film was normal. esophagus not seen well. Stomach normal as       was duodenum and papilla. Papilla deeply cannulated with sphincterotome,       wire placed. Injected contrast - no clear stone. Given findings on MRCP       of possible stone a biliary sphincterotomy was performed and a balloon       inserted. occlusion cholangiograms and sweeps did not reveal or produce       a stone. Pancreas not injected. Mildly dilated biliary tree max 8 mm       extrahepatic duct. Gallbladder filled. Impression:               - The common bile duct was mildly dilated. No                            stones, otherwise normal. Moderate Sedation:      N/A- Per Anesthesia Care Recommendation:           - Return patient to hospital ward for ongoing care.                           - Surgical consultation for consideration of                            cholecystectomy at appointment to be scheduled. Procedure Code(s):        --- Professional ---                           [phone], Endoscopic retrograde  cholangiopancreatography (ERCP); with                            sphincterotomy/papillotomy Diagnosis Code(s):        --- Professional ---                           K83.8, Other specified diseases of biliary tract CPT copyright 2016 [employer]. All rights reserved. The codes documented in this report are preliminary and upon coder review may  be revised to meet current compliance requirements. Iva Boop, MD 06/21/2016 8:22:46 AM This report has been signed electronically. Number of Addenda: 0

## 2016-06-21 NOTE — H&P (View-Only) (Signed)
Referring Provider:   Catarina Hartshorn, MD Primary Care Physician:  Jackie Plum, MD Primary Gastroenterologist:  None (unassigned)  Reason for Consultation:  Elevated liver chemistries in the setting of gallstones  HPI: Adriana Fernandez is a 46 y.o. female admitted to the hospital early this morning. She had been in and out of the emergency room on 2 or 3 occasions over the past 6 weeks with recurrent upper abdominal discomfort and nausea and vomiting. Ultrasound 6 weeks ago had shown gallstones with a 3 mm duct; ultrasound last night showed a 7 mm duct with a distended gallbladder and a small amount of pericholecystic fluid raising the question of acute cholecystitis, although overnight on antibiotics and pain medication, the patient has been pain-free and afebrile, and her white count was normal on presentation.  Of note, on presentation the patient's liver chemistries were elevated, with transaminases in the 200-400 range, where as back in February, they had been completely normal. They have been repeatedly mildly elevated, in the 100-150 range, for the past 6 weeks when checked in the emergency room. Lipase have been normal on prior occasions but now is minimally elevated at 60. Of note, the patient's bilirubin level has been consistently normal.  Updated labs for this morning were not ordered, so I have just ordered them.  It is interesting that, over the past 9 months, the patient has achieved an intentional 66 pounds weight loss through a weight control program.   Past Medical History:  Diagnosis Date  . Depression   . Elevated LFTs   . Gallstones   . HIV disease (HCC)   . 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 Hypertension   . Paranoid schizophrenia (HCC)   . Psychosis   . Schizophrenia (HCC)   . Sickle cell trait (HCC)     History reviewed. No pertinent surgical history.  Prior to Admission medications    Medication Sig Start Date End Date Taking? Authorizing Provider  amLODipine (NORVASC) 5 MG tablet Take 1 tablet (5 mg total) by mouth daily. 04/21/16  Yes [name], MD  buPROPion (WELLBUTRIN SR) 150 MG 12 hr tablet Take 1 tablet (150 mg total) by mouth 2 (two) times daily. 06/24/12  Yes Randall Hiss, MD  dicyclomine (BENTYL) 20 MG tablet Take 1 tablet (20 mg total) by mouth 2 (two) times daily. 06/13/16  Yes April Palumbo, MD  hydrochlorothiazide (HYDRODIURIL) 25 MG tablet Take 1 tablet (25 mg total) by mouth daily. 04/21/16  Yes [name], MD  ibuprofen (ADVIL,MOTRIN) 600 MG tablet Take 1 tablet (600 mg total) by mouth every 6 (six) hours as needed. Patient taking differently: Take 600 mg by mouth every 6 (six) hours as needed for headache.  05/04/16  Yes [name], MD  ondansetron (ZOFRAN ODT) 8 MG disintegrating tablet  ODT q8 hours prn nausea Patient taking differently: Take 8 mg by mouth every 8 (eight) hours as needed for nausea or vomiting.  ODT q8 hours prn nausea 06/13/16  Yes April Palumbo, MD  paliperidone (INVEGA) 3 MG 24 hr tablet Take 3 mg by mouth daily.    Yes Historical Provider, MD  traMADol (ULTRAM) 50 MG tablet Take 1 tablet (50 mg total) by mouth every 6 (six) hours as needed. Patient taking differently: Take 50 mg by mouth every 6 (six) hours as needed for moderate pain.  05/04/16  Yes [name], MD  valACYclovir (VALTREX) 1000 MG tablet Take 1,000  mg by mouth daily.   Yes Historical Provider, MD    Current Facility-Administered Medications  Medication Dose Route Frequency Provider Last Rate Last Dose  . amLODipine (NORVASC) tablet 5 mg  5 mg Oral Daily Catarina Hartshorn, MD      . buPROPion [facility] SR) 12 hr tablet 150 mg  150 mg Oral BID Lorretta Harp, MD      . dicyclomine (BENTYL) tablet 20 mg  20 mg Oral BID Lorretta Harp, MD      . morphine 4 MG/ML injection 2 mg  2 mg Intravenous Q4H PRN Lorretta Harp, MD   2 mg at 06/20/16 0725  . ondansetron (ZOFRAN) injection  4 mg  4 mg Intravenous Q8H PRN Lorretta Harp, MD      . paliperidone (INVEGA) 24 hr tablet 3 mg  3 mg Oral Daily Lorretta Harp, MD      . piperacillin-tazobactam (ZOSYN) IVPB 3.375 g  3.375 g Intravenous Q8H Therisa Doyne, MD 12.5 mL/hr at 06/20/16 0543 3.375 g at 06/20/16 0543  . traMADol (ULTRAM) tablet 50 mg  50 mg Oral Q6H PRN Lorretta Harp, MD      . valACYclovir (VALTREX) tablet 1,000 mg  1,000 mg Oral Daily Lorretta Harp, MD      . zolpidem (AMBIEN) tablet 5 mg  5 mg Oral QHS PRN Lorretta Harp, MD        Allergies as of 06/19/2016 - Review Complete 06/19/2016  Allergen Reaction Noted  . Latex  03/15/2012    Family History  Problem Relation Age of Onset  . GER disease Mother   . Hypertension Mother   . Sickle cell anemia Brother   . Diabetes Maternal Aunt   . Diabetes Maternal Grandmother   . Breast cancer Maternal Aunt   . Throat cancer Paternal Grandmother     Social History   Social History  . Marital status: Divorced    Spouse name: N/A  . Number of children: N/A  . Years of education: N/A   Occupational History  . Not on file.   Social History Main Topics  . Smoking status: Never Smoker  . Smokeless tobacco: Never Used  . Alcohol use No  . Drug use: No  . Sexual activity: Yes    Partners: Male    Birth control/ protection: IUD   Other Topics Concern  . Not on file   Social History Narrative  . No narrative on file    Review of Systems: No known cardiopulmonary disease or symptoms in this nonsmoker. No angina, no urinary symptoms, no bowel problems, no skin problems or swollen lymph nodes.  Physical Exam: Vital signs in last 24 hours: Temp:  [98 F (36.7 C)-99.8 F (37.7 C)] 98 F (36.7 C) (04/26 0231) Pulse Rate:  [59-114] 59 (04/26 0231) Resp:  [16-20] 16 (04/26 0231) BP: (96-115)/(56-74) 96/56 (04/26 0231) SpO2:  [98 %-100 %] 98 % (04/26 0231) Weight:  [68 kg (150 lb)] 68 kg (150 lb) (04/25 1846) Last BM Date: 06/19/16 General:   Alert,  Well-developed,  well-nourished, pleasant and cooperative in NAD Head:  Normocephalic and atraumatic. Eyes:  Sclera clear, no icterus.   Conjunctiva pink. Mouth:   No ulcerations or lesions.  Oropharynx pink & moist. Neck:   No masses or thyromegaly. Lungs:  Clear throughout to auscultation.   No wheezes, crackles, or rhonchi. No evident respiratory distress. Heart:   Regular rate and rhythm; no murmurs, clicks, rubs,  or gallops. Abdomen:  Soft, nontender, nontympanitic, and nondistended.  No masses, hepatosplenomegaly or ventral hernias noted. Overall, this is a very benign abdomen, with no discernible tenderness, even on fairly firm palpation of the right upper quadrant. Msk:   Symmetrical without gross deformities. Pulses:  Normal radial pulse is noted. Extremities:   Without clubbing, cyanosis, or edema. Neurologic:  Alert and coherent;  grossly normal neurologically. Skin:  Intact without significant lesions or rashes. Cervical Nodes:  No significant cervical adenopathy. Psych:   Alert and cooperative. Normal mood and affect.  Intake/Output from previous day: 04/25 0701 - 04/26 0700 In: 3250 [IV Piggyback:3250] Out: -  Intake/Output this shift: No intake/output data recorded.  Lab Results:  Recent Labs  06/19/16 1923  WBC 6.5  HGB 12.9  HCT 37.1  PLT 254   BMET  Recent Labs  06/19/16 1923  NA 137  K 3.3*  CL 102  CO2 24  GLUCOSE 134*  BUN 9  CREATININE 0.76  CALCIUM 9.0   LFT  Recent Labs  06/19/16 1923  PROT 7.8  ALBUMIN 3.9  AST 221*  ALT 404*  ALKPHOS 146*  BILITOT 0.8   PT/INR  Recent Labs  06/20/16 0759  LABPROT 13.7  INR 1.04    Studies/Results: US Abdomen Limited Ruq  Result Date: 06/19/2016 CLINICAL DATA:  Intermittent right upper quadrant pain times several months, latest episode x5 hours. EXAM: US ABDOMEN LIMITED - RIGHT UPPER QUADRANT COMPARISON:  None. FINDINGS: Gallbladder: Numerous layering gallstones are seen within the gallbladder and at the  neck of the gallbladder as well the largest measuring 1.1 cm. Gallbladder wall is top normal at 2.5 mm in thickness. Trace pericholecystic edema and fluid. No sonographic Murphy's however the patient was reportedly on pain medication. Common bile duct: Diameter: 7 mm.  No choledocholithiasis. Liver: No focal lesion identified. Within normal limits in parenchymal echogenicity. IMPRESSION: Numerous gallstones are seen within a moderately distended appearing gallbladder with trace pericholecystic fluid. Findings raise concern for acute cholecystitis. Electronically Signed   By: Tollie Eth M.D.   On: 06/19/2016 22:26    Impression: 1. Recurring upper abdominal symptoms of pain and nausea and vomiting 2. Cholelithiasis 3. Progressive recent onset elevation of liver chemistries and progressive, although not frankly abnormal, dilatation of the CBD on serial ultrasounds over the past 6 weeks, raising question of choledocholithiasis 4. Minimal elevation of lipase last night, of doubtful clinical significance  Plan: 1. Recheck labs today  2. Obtain abdominal MRI with MRCP to check for choledocholithiasis. If confirmed, the patient would need preoperative ERCP.  At this time, the patient appears to be a "intermediate" probability of having a common duct stone, so I don't think that there is sufficient indication to proceed directly to ERCP, unless the MRI is abnormal with the patient's liver chemistries show progressive increase. 3. Surgical consultation. Have discussed case with Dr. Ovidio Kin of Gen. surgery   LOS: 0 days   Marguarite Markov V  06/20/2016, 9:04 AM   Pager [phone] If no answer or after 5 PM call [phone]

## 2016-06-21 NOTE — Progress Notes (Signed)
PROGRESS NOTE  Adriana Fernandez ZOX:[id number] DOB: February 27, 1970 DOA: 06/19/2016 PCP: Jackie Plum, MD  Brief History:  46 year old female with a history of hypertension, HIV, paranoid schizophrenia, sickle cell trait presented with one-day history of abdominal pain that began on afternoon of June 19, 2016 with associated nausea and vomiting. The patient visited the emergency department more 06/13/2016 with same symptoms. She was discharged home in stable condition. She returned to the ED again yesterday 06/19/16 with abdominal pain after eating tacos with ground [place] and tomatoes.She has a history of gallstones was referred to surgery but is having difficulty getting into the office for evaluation. In the emergency department, the patient was afebrile and he mechanically stable but has increasing liver enzymes with AST 221, ALT 404, alkaline phosphatase 126, total bilirubin 0.8, lipase 60. Gastroenterology and general surgery were consulted to assist with management.  There was some concern of choledocholithiasis because of elevated bilirubin up to 1.4 at the time of admission. Abdominal ultrasound showed cholelithiasis with moderate gallbladder distention and  trace pericholecystic fluid concerning for cholecystitis.  Assessment/Plan: Symptomatic cholelithiasis/Choledocholithiasis -Concern about acute cholecystitis -Continue Zosyn -06/19/2016 abdominal ultrasound--cholelithiasis with GB distention and trace pericholecystic fluid concerning for acute cholecystitis -Appreciate GI consult--> MRCP to rule out choledocholithiasis -06/20/16 MRCP--> solitary 3 mm choledocholithiasis at the junction of CBD and amp along with mild dilated common bile duct; diffuse central intrahepatic biliary ductal dilatation; cholelithiasis with mild diffuse gallbladder wall thickening -06/21/16 ERCP--bile duct stone. dilated biliary tree up to 8mm -Continue IV fluids -continue clear liquid  diet -Appreciate general surgery consult-->planning lap chole 4/28  HIV -likely chronic non-progressor -04/11/2016--CD4--800/44%; HIV on an undetectable  Hypertension -Holding amlodipine and HCTZ secondary to soft blood pressure-->BP remains stable  Paranoid schizophrenia -Continue InVega  Depression -Continue Wellbutrin    Disposition Plan:   Home 4/29 if stable and cleared by surgery Family Communication:  No Family at bedside  Consultants:  [facility], General Surgery  Code Status:  FULL   DVT Prophylaxis:  SCDs   Procedures: As Listed in Progress Note Above  Antibiotics: Zosyn 4/25>>    Subjective: Patient states that her abdominal pain is much better. Denies any nausea, vomiting, diarrhea, dysuria, hematuria. No fevers or chills. No chest pain or shortness breath.  Objective: Vitals:   06/21/16 0910 06/21/16 0920 06/21/16 0930 06/21/16 1344  BP: (!) 106/51 (!) 106/50 (!) 117/58 109/76  Pulse: (!) 50 (!) 49 (!) 56 85  Resp: Temp:    97.6 F (36.4 C)  TempSrc:    Oral  SpO2: 98% 95%  99%  Weight:      Height:        Intake/Output Summary (Last 24 hours) at 06/21/16 1636 Last data filed at 06/21/16 1403  Gross per 24 hour  Intake             2994 ml  Output                0 ml  Net             2994 ml   Weight change:  Exam:   General:  Pt is alert, follows commands appropriately, not in acute distress  HEENT: No icterus, No thrush, No neck mass, [place]/AT  Cardiovascular: RRR, S1/S2, no rubs, no gallops  Respiratory: CTA bilaterally, no wheezing, no crackles, no rhonchi  Abdomen: Soft/+BS, non tender, non distended, no guarding  Extremities: No edema,  No lymphangitis, No petechiae, No rashes, no synovitis   Data Reviewed: I have personally reviewed following labs and imaging studies Basic Metabolic Panel:  Recent Labs Lab 06/19/16 1923 06/20/16 1020 06/21/16 0649 06/21/16 0951  NA 137 140 140 138  K 3.3*  3.9 4.2 4.5  CL 102 110 111 108  CO2 GLUCOSE 134* 89 86 119*  BUN 9 6 <5* 5*  CREATININE 0.76 0.66 0.63 0.73  CALCIUM 9.0 8.5* 8.8* 9.0   Liver Function Tests:  Recent Labs Lab 06/19/16 1923 06/20/16 1020 06/21/16 0649 06/21/16 0951  AST 221* 188* 80* 94*  ALT 404* 470* 312* 349*  ALKPHOS 146* 147* 128* 140*  BILITOT 0.8 0.6 0.6 0.5  PROT 7.8 6.9 6.2* 7.1  ALBUMIN 3.9 3.5 3.2* 3.5    Recent Labs Lab 06/19/16 1923 06/20/16 1020  LIPASE 60* 15   No results for input(s): AMMONIA in the last 168 hours. Coagulation Profile:  Recent Labs Lab 06/20/16 0759 06/21/16 0649  INR 1.04 1.11   CBC:  Recent Labs Lab 06/19/16 1923 06/20/16 1020 06/21/16 0649  WBC 6.5 4.7 4.0  NEUTROABS 4.5  --  1.8  HGB 12.9 11.8* 11.2*  HCT 37.1 34.3* 32.1*  MCV 84.3 86.0 84.7  PLT 254 234 222   Cardiac Enzymes: No results for input(s): CKTOTAL, CKMB, CKMBINDEX, TROPONINI in the last 168 hours. BNP: Invalid input(s): POCBNP CBG: No results for input(s): GLUCAP in the last 168 hours. HbA1C: No results for input(s): HGBA1C in the last 72 hours. Urine analysis:    Component Value Date/Time   COLORURINE YELLOW 06/19/2016 1919   APPEARANCEUR CLEAR 06/19/2016 1919   LABSPEC 1.016 06/19/2016 1919   PHURINE 7.0 06/19/2016 1919   GLUCOSEU NEGATIVE 06/19/2016 1919   HGBUR NEGATIVE 06/19/2016 1919   BILIRUBINUR NEGATIVE 06/19/2016 1919   KETONESUR NEGATIVE 06/19/2016 1919   PROTEINUR NEGATIVE 06/19/2016 1919   UROBILINOGEN 1 11/09/2012 1006   NITRITE NEGATIVE 06/19/2016 1919   LEUKOCYTESUR NEGATIVE 06/19/2016 1919   Sepsis Labs: (procalcitonin:4,lacticidven:4) )No results found for this or any previous visit (from the past 240 hour(s)).   Scheduled Meds: . buPROPion  150 mg Oral BID  . Chlorhexidine Gluconate Cloth  6 each Topical Once   And  . Chlorhexidine Gluconate Cloth  6 each Topical Once  . dicyclomine  20 mg Oral BID  . indomethacin  100 mg  Rectal Once  . paliperidone  3 mg Oral Daily  . valACYclovir  1,000 mg Oral Daily   Continuous Infusions: . 0.9 % NaCl with KCl 20 mEq / L 75 mL/hr at 06/20/16 1144  . piperacillin-tazobactam (ZOSYN)  IV 3.375 g (06/21/16 1411)    Procedures/Studies: Mr 3d Recon At Scanner  Result Date: 06/20/2016 CLINICAL DATA:  Inpatient. Abdominal pain, nausea and vomiting. Cholelithiasis with concern for acute cholecystitis at sonography. EXAM: MRI ABDOMEN WITHOUT AND WITH CONTRAST (INCLUDING MRCP) TECHNIQUE: Multiplanar multisequence MR imaging of the abdomen was performed both before and after the administration of intravenous contrast. Heavily T2-weighted images of the biliary and pancreatic ducts were obtained, and three-dimensional MRCP images were rendered by post processing. CONTRAST:  14mL MULTIHANCE GADOBENATE DIMEGLUMINE 529 MG/ML IV SOLN COMPARISON:  06/19/2016 right upper quadrant abdominal sonogram. FINDINGS: Lower chest: Small pericardial effusion/ thickening. Trace dependent bilateral pleural effusions. Hepatobiliary: Normal liver size and configuration. Minimal diffuse hepatic steatosis. No liver mass. Mildly distended gallbladder. Numerous subcentimeter gallstones layering in the gallbladder. Mild diffuse gallbladder wall thickening. No pericholecystic fluid.  Mild diffuse central intrahepatic biliary ductal dilatation. Common bile duct diameter 7 mm, mildly dilated. There is a solitary round 3 mm filling defect at the junction of the common bile duct and ampulla (series 4/ image 39), compatible with a small stone. No enhancing biliary or ampullary mass. Pancreas: No pancreatic mass or duct dilation.  No pancreas divisum. Spleen: Normal size spleen. There are several (at least 5) small T2 hyperintense lesions scattered throughout the spleen, largest 1.3 cm, a few of which demonstrate thin internal septations, with no solid enhancement, compatible with benign splenic lymphangiomas. Adrenals/Urinary  Tract: Normal adrenals. No hydronephrosis. There is a 1.1 x 1.0 cm renal cortical lesion in the upper right kidney (series 1303/ image 54) with thin internal septation and no solid enhancement, compatible with a Bosniak category 2 renal cyst. Additional subcentimeter simple renal cysts in both kidneys. No suspicious renal masses. Stomach/Bowel: Grossly normal stomach. Visualized small and large bowel is normal caliber, with no bowel wall thickening. Vascular/Lymphatic: Normal caliber abdominal aorta. Patent portal, splenic, hepatic and renal veins. No pathologically enlarged lymph nodes in the abdomen. Other: No abdominal ascites or focal fluid collection. Musculoskeletal: No aggressive appearing focal osseous lesions. IMPRESSION: 1. Suggestion of a solitary 3 mm choledocholith at the junction of the common bile duct and ampulla, with mildly dilated common bile duct (7 mm diameter) and mild diffuse central intrahepatic biliary ductal dilatation . 2. Cholelithiasis. Mildly distended gallbladder. Mild diffuse gallbladder wall thickening. Acute cholecystitis cannot be excluded. 3. Trace dependent bilateral pleural effusions. Small pericardial effusion/thickening. 4. Minimal diffuse hepatic steatosis. 5. Small Bosniak category 1 and category 2 renal cysts. No suspicious renal masses. 6. Small splenic lymphangiomas. Electronically Signed   By: Delbert Phenix M.D.   On: 06/20/2016 11:49   Dg Ercp Biliary & Pancreatic Ducts  Result Date: 06/21/2016 CLINICAL DATA:  Choledocholithiasis by MRCP EXAM: INTRAOPERATIVE CHOLANGIOGRAM TECHNIQUE: Cholangiographic images from the C-arm fluoroscopic device were submitted for interpretation post-operatively. Please see the procedural report for the amount of contrast and the fluoroscopy time utilized. COMPARISON:  06/20/2016 FINDINGS: Limited spot fluoroscopic imaging during the ERCP procedure. Retrograde cholangiogram performed. No significant biliary dilatation, stricture or  filling defect. Balloon sweep performed. Contrast refluxes into the cystic duct and gallbladder. IMPRESSION: Biliary system appears patent. Electronically Signed   By: Judie Petit.  Shick M.D.   On: 06/21/2016 09:03   Dg Abdomen Acute W/chest  Result Date: 06/13/2016 CLINICAL DATA:  Sudden onset of lower abdominal pain.  Vomiting. EXAM: DG ABDOMEN ACUTE W/ 1V CHEST COMPARISON:  Radiographs 05/04/2016 FINDINGS: The cardiomediastinal contours are normal. The lungs are clear. There is no free intra-abdominal air. No dilated bowel loops to suggest obstruction. Small-moderate volume of stool throughout the colon. No radiopaque calculi. Right pelvic phleboliths again seen. IUD in the pelvis. No acute osseous abnormalities are seen. IMPRESSION: 1. Normal bowel gas pattern. No evidence of bowel obstruction or free air. 2. Clear lungs. Electronically Signed   By: Rubye Oaks M.D.   On: 06/13/2016 03:10   Mr Abdomen Mrcp Vivien Rossetti Contast  Result Date: 06/20/2016 CLINICAL DATA:  Inpatient. Abdominal pain, nausea and vomiting. Cholelithiasis with concern for acute cholecystitis at sonography. EXAM: MRI ABDOMEN WITHOUT AND WITH CONTRAST (INCLUDING MRCP) TECHNIQUE: Multiplanar multisequence MR imaging of the abdomen was performed both before and after the administration of intravenous contrast. Heavily T2-weighted images of the biliary and pancreatic ducts were obtained, and three-dimensional MRCP images were rendered by post processing. CONTRAST:  14mL MULTIHANCE GADOBENATE DIMEGLUMINE 529  MG/ML IV SOLN COMPARISON:  06/19/2016 right upper quadrant abdominal sonogram. FINDINGS: Lower chest: Small pericardial effusion/ thickening. Trace dependent bilateral pleural effusions. Hepatobiliary: Normal liver size and configuration. Minimal diffuse hepatic steatosis. No liver mass. Mildly distended gallbladder. Numerous subcentimeter gallstones layering in the gallbladder. Mild diffuse gallbladder wall thickening. No pericholecystic  fluid. Mild diffuse central intrahepatic biliary ductal dilatation. Common bile duct diameter 7 mm, mildly dilated. There is a solitary round 3 mm filling defect at the junction of the common bile duct and ampulla (series 4/ image 39), compatible with a small stone. No enhancing biliary or ampullary mass. Pancreas: No pancreatic mass or duct dilation.  No pancreas divisum. Spleen: Normal size spleen. There are several (at least 5) small T2 hyperintense lesions scattered throughout the spleen, largest 1.3 cm, a few of which demonstrate thin internal septations, with no solid enhancement, compatible with benign splenic lymphangiomas. Adrenals/Urinary Tract: Normal adrenals. No hydronephrosis. There is a 1.1 x 1.0 cm renal cortical lesion in the upper right kidney (series 1303/ image 54) with thin internal septation and no solid enhancement, compatible with a Bosniak category 2 renal cyst. Additional subcentimeter simple renal cysts in both kidneys. No suspicious renal masses. Stomach/Bowel: Grossly normal stomach. Visualized small and large bowel is normal caliber, with no bowel wall thickening. Vascular/Lymphatic: Normal caliber abdominal aorta. Patent portal, splenic, hepatic and renal veins. No pathologically enlarged lymph nodes in the abdomen. Other: No abdominal ascites or focal fluid collection. Musculoskeletal: No aggressive appearing focal osseous lesions. IMPRESSION: 1. Suggestion of a solitary 3 mm choledocholith at the junction of the common bile duct and ampulla, with mildly dilated common bile duct (7 mm diameter) and mild diffuse central intrahepatic biliary ductal dilatation . 2. Cholelithiasis. Mildly distended gallbladder. Mild diffuse gallbladder wall thickening. Acute cholecystitis cannot be excluded. 3. Trace dependent bilateral pleural effusions. Small pericardial effusion/thickening. 4. Minimal diffuse hepatic steatosis. 5. Small Bosniak category 1 and category 2 renal cysts. No suspicious renal  masses. 6. Small splenic lymphangiomas. Electronically Signed   By: Delbert Phenix M.D.   On: 06/20/2016 11:49   US Abdomen Limited Ruq  Result Date: 06/19/2016 CLINICAL DATA:  Intermittent right upper quadrant pain times several months, latest episode x5 hours. EXAM: US ABDOMEN LIMITED - RIGHT UPPER QUADRANT COMPARISON:  None. FINDINGS: Gallbladder: Numerous layering gallstones are seen within the gallbladder and at the neck of the gallbladder as well the largest measuring 1.1 cm. Gallbladder wall is top normal at 2.5 mm in thickness. Trace pericholecystic edema and fluid. No sonographic Murphy's however the patient was reportedly on pain medication. Common bile duct: Diameter: 7 mm.  No choledocholithiasis. Liver: No focal lesion identified. Within normal limits in parenchymal echogenicity. IMPRESSION: Numerous gallstones are seen within a moderately distended appearing gallbladder with trace pericholecystic fluid. Findings raise concern for acute cholecystitis. Electronically Signed   By: Tollie Eth M.D.   On: 06/19/2016 22:26    Dorian Renfro, DO  [facility] Pager [phone]  If 7PM-7AM, please contact night-coverage [email or web address] Password [email or web address] 06/21/2016, 4:36 PM   LOS: 1 day

## 2016-06-21 NOTE — Anesthesia Postprocedure Evaluation (Signed)
Anesthesia Post Note  Patient: Adriana Fernandez  Procedure(s) Performed: Procedure(s) (LRB): ENDOSCOPIC RETROGRADE CHOLANGIOPANCREATOGRAPHY (ERCP) (N/A)  Patient location during evaluation: PACU Anesthesia Type: General Level of consciousness: awake and alert Pain management: pain level controlled Vital Signs Assessment: post-procedure vital signs reviewed and stable Respiratory status: spontaneous breathing, nonlabored ventilation, respiratory function stable and patient connected to nasal cannula oxygen Cardiovascular status: blood pressure returned to baseline and stable Postop Assessment: no signs of nausea or vomiting Anesthetic complications: no       Last Vitals:  Vitals:   06/21/16 0920 06/21/16 0930  BP: (!) 106/50 (!) 117/58  Pulse: (!) 49 (!) 56  Resp: 20 20  Temp:      Last Pain:  Vitals:   06/21/16 1050  TempSrc:   PainSc: 4                  Reef Achterberg DAVID

## 2016-06-21 NOTE — Progress Notes (Signed)
Patient ID: Adriana Fernandez, female   DOB: 1970-08-04, 46 y.o.   MRN: [id number]  [facility] Surgery Progress Note  Day of Surgery  Subjective: CC- abdominal pain, cholecystitis Feeling well today. Denies any abdominal pain, nausea, or vomiting. Patient had ERCP earlier today which showed common bile duct mildly dilated, no stones, otherwise normal; sphincterotomy performed.  Tolerating clear liquids today.  Objective: Vital signs in last 24 hours: Temp:  [98.2 F (36.8 C)-99 F (37.2 C)] 98.2 F (36.8 C) (04/27 0833) Pulse Rate:  [49-99] 56 (04/27 0930) Resp:  [16-21] 20 (04/27 0930) BP: (97-155)/(50-70) 117/58 (04/27 0930) SpO2:  [95 %-100 %] 95 % (04/27 0920) Last BM Date: 06/18/16  Intake/Output from previous day: 04/26 0701 - 04/27 0700 In: 2070 [P.O.:600; I.V.:1370; IV Piggyback:100] Out: -  Intake/Output this shift: Total I/O In: 1044 [P.O.:444; I.V.:600] Out: -   PE: Gen:  Alert, NAD, pleasant Card:  RRR, no M/G/R heard Pulm:  CTAB, no W/R/R, effort normal Abd: Soft, NT/ND, +BS, no HSM, no hernia Ext:  No erythema, edema, or tenderness BUE/BLE   Lab Results:   Recent Labs  06/20/16 1020 06/21/16 0649  WBC 4.7 4.0  HGB 11.8* 11.2*  HCT 34.3* 32.1*  PLT 234 222   BMET  Recent Labs  06/21/16 0649 06/21/16 0951  NA 140 138  K 4.2 4.5  CL 111 108  CO2 23 22  GLUCOSE 86 119*  BUN <5* 5*  CREATININE 0.63 0.73  CALCIUM 8.8* 9.0   PT/INR  Recent Labs  06/20/16 0759 06/21/16 0649  LABPROT 13.7 14.4  INR 1.04 1.11   CMP     Component Value Date/Time   NA 138 06/21/2016 0951   K 4.5 06/21/2016 0951   CL 108 06/21/2016 0951   CO2 22 06/21/2016 0951   GLUCOSE 119 (H) 06/21/2016 0951   BUN 5 (L) 06/21/2016 0951   CREATININE 0.73 06/21/2016 0951   CREATININE 0.97 04/11/2016 1528   CALCIUM 9.0 06/21/2016 0951   PROT 7.1 06/21/2016 0951   ALBUMIN 3.5 06/21/2016 0951   AST 94 (H) 06/21/2016 0951   ALT 349 (H) 06/21/2016 0951    ALKPHOS 140 (H) 06/21/2016 0951   BILITOT 0.5 06/21/2016 0951   GFRNONAA >60 06/21/2016 0951   GFRNONAA 72 04/11/2016 1528   GFRAA >60 06/21/2016 0951   GFRAA 83 04/11/2016 1528   Lipase     Component Value Date/Time   LIPASE 15 06/20/2016 1020       Studies/Results: Mr 3d Recon At Scanner  Result Date: 06/20/2016 CLINICAL DATA:  Inpatient. Abdominal pain, nausea and vomiting. Cholelithiasis with concern for acute cholecystitis at sonography. EXAM: MRI ABDOMEN WITHOUT AND WITH CONTRAST (INCLUDING MRCP) TECHNIQUE: Multiplanar multisequence MR imaging of the abdomen was performed both before and after the administration of intravenous contrast. Heavily T2-weighted images of the biliary and pancreatic ducts were obtained, and three-dimensional MRCP images were rendered by post processing. CONTRAST:  14mL MULTIHANCE GADOBENATE DIMEGLUMINE 529 MG/ML IV SOLN COMPARISON:  06/19/2016 right upper quadrant abdominal sonogram. FINDINGS: Lower chest: Small pericardial effusion/ thickening. Trace dependent bilateral pleural effusions. Hepatobiliary: Normal liver size and configuration. Minimal diffuse hepatic steatosis. No liver mass. Mildly distended gallbladder. Numerous subcentimeter gallstones layering in the gallbladder. Mild diffuse gallbladder wall thickening. No pericholecystic fluid. Mild diffuse central intrahepatic biliary ductal dilatation. Common bile duct diameter 7 mm, mildly dilated. There is a solitary round 3 mm filling defect at the junction of the common bile duct and ampulla (series  4/ image 39), compatible with a small stone. No enhancing biliary or ampullary mass. Pancreas: No pancreatic mass or duct dilation.  No pancreas divisum. Spleen: Normal size spleen. There are several (at least 5) small T2 hyperintense lesions scattered throughout the spleen, largest 1.3 cm, a few of which demonstrate thin internal septations, with no solid enhancement, compatible with benign splenic  lymphangiomas. Adrenals/Urinary Tract: Normal adrenals. No hydronephrosis. There is a 1.1 x 1.0 cm renal cortical lesion in the upper right kidney (series 1303/ image 54) with thin internal septation and no solid enhancement, compatible with a Bosniak category 2 renal cyst. Additional subcentimeter simple renal cysts in both kidneys. No suspicious renal masses. Stomach/Bowel: Grossly normal stomach. Visualized small and large bowel is normal caliber, with no bowel wall thickening. Vascular/Lymphatic: Normal caliber abdominal aorta. Patent portal, splenic, hepatic and renal veins. No pathologically enlarged lymph nodes in the abdomen. Other: No abdominal ascites or focal fluid collection. Musculoskeletal: No aggressive appearing focal osseous lesions. IMPRESSION: 1. Suggestion of a solitary 3 mm choledocholith at the junction of the common bile duct and ampulla, with mildly dilated common bile duct (7 mm diameter) and mild diffuse central intrahepatic biliary ductal dilatation . 2. Cholelithiasis. Mildly distended gallbladder. Mild diffuse gallbladder wall thickening. Acute cholecystitis cannot be excluded. 3. Trace dependent bilateral pleural effusions. Small pericardial effusion/thickening. 4. Minimal diffuse hepatic steatosis. 5. Small Bosniak category 1 and category 2 renal cysts. No suspicious renal masses. 6. Small splenic lymphangiomas. Electronically Signed   By: Delbert Phenix M.D.   On: 06/20/2016 11:49   Dg Ercp Biliary & Pancreatic Ducts  Result Date: 06/21/2016 CLINICAL DATA:  Choledocholithiasis by MRCP EXAM: INTRAOPERATIVE CHOLANGIOGRAM TECHNIQUE: Cholangiographic images from the C-arm fluoroscopic device were submitted for interpretation post-operatively. Please see the procedural report for the amount of contrast and the fluoroscopy time utilized. COMPARISON:  06/20/2016 FINDINGS: Limited spot fluoroscopic imaging during the ERCP procedure. Retrograde cholangiogram performed. No significant biliary  dilatation, stricture or filling defect. Balloon sweep performed. Contrast refluxes into the cystic duct and gallbladder. IMPRESSION: Biliary system appears patent. Electronically Signed   By: Judie Petit.  Shick M.D.   On: 06/21/2016 09:03   Mr Abdomen Mrcp Vivien Rossetti Contast  Result Date: 06/20/2016 CLINICAL DATA:  Inpatient. Abdominal pain, nausea and vomiting. Cholelithiasis with concern for acute cholecystitis at sonography. EXAM: MRI ABDOMEN WITHOUT AND WITH CONTRAST (INCLUDING MRCP) TECHNIQUE: Multiplanar multisequence MR imaging of the abdomen was performed both before and after the administration of intravenous contrast. Heavily T2-weighted images of the biliary and pancreatic ducts were obtained, and three-dimensional MRCP images were rendered by post processing. CONTRAST:  14mL MULTIHANCE GADOBENATE DIMEGLUMINE 529 MG/ML IV SOLN COMPARISON:  06/19/2016 right upper quadrant abdominal sonogram. FINDINGS: Lower chest: Small pericardial effusion/ thickening. Trace dependent bilateral pleural effusions. Hepatobiliary: Normal liver size and configuration. Minimal diffuse hepatic steatosis. No liver mass. Mildly distended gallbladder. Numerous subcentimeter gallstones layering in the gallbladder. Mild diffuse gallbladder wall thickening. No pericholecystic fluid. Mild diffuse central intrahepatic biliary ductal dilatation. Common bile duct diameter 7 mm, mildly dilated. There is a solitary round 3 mm filling defect at the junction of the common bile duct and ampulla (series 4/ image 39), compatible with a small stone. No enhancing biliary or ampullary mass. Pancreas: No pancreatic mass or duct dilation.  No pancreas divisum. Spleen: Normal size spleen. There are several (at least 5) small T2 hyperintense lesions scattered throughout the spleen, largest 1.3 cm, a few of which demonstrate thin internal septations, with no solid  enhancement, compatible with benign splenic lymphangiomas. Adrenals/Urinary Tract: Normal adrenals.  No hydronephrosis. There is a 1.1 x 1.0 cm renal cortical lesion in the upper right kidney (series 1303/ image 54) with thin internal septation and no solid enhancement, compatible with a Bosniak category 2 renal cyst. Additional subcentimeter simple renal cysts in both kidneys. No suspicious renal masses. Stomach/Bowel: Grossly normal stomach. Visualized small and large bowel is normal caliber, with no bowel wall thickening. Vascular/Lymphatic: Normal caliber abdominal aorta. Patent portal, splenic, hepatic and renal veins. No pathologically enlarged lymph nodes in the abdomen. Other: No abdominal ascites or focal fluid collection. Musculoskeletal: No aggressive appearing focal osseous lesions. IMPRESSION: 1. Suggestion of a solitary 3 mm choledocholith at the junction of the common bile duct and ampulla, with mildly dilated common bile duct (7 mm diameter) and mild diffuse central intrahepatic biliary ductal dilatation . 2. Cholelithiasis. Mildly distended gallbladder. Mild diffuse gallbladder wall thickening. Acute cholecystitis cannot be excluded. 3. Trace dependent bilateral pleural effusions. Small pericardial effusion/thickening. 4. Minimal diffuse hepatic steatosis. 5. Small Bosniak category 1 and category 2 renal cysts. No suspicious renal masses. 6. Small splenic lymphangiomas. Electronically Signed   By: Delbert Phenix M.D.   On: 06/20/2016 11:49   US Abdomen Limited Ruq  Result Date: 06/19/2016 CLINICAL DATA:  Intermittent right upper quadrant pain times several months, latest episode x5 hours. EXAM: US ABDOMEN LIMITED - RIGHT UPPER QUADRANT COMPARISON:  None. FINDINGS: Gallbladder: Numerous layering gallstones are seen within the gallbladder and at the neck of the gallbladder as well the largest measuring 1.1 cm. Gallbladder wall is top normal at 2.5 mm in thickness. Trace pericholecystic edema and fluid. No sonographic Murphy's however the patient was reportedly on pain medication. Common bile duct:  Diameter: 7 mm.  No choledocholithiasis. Liver: No focal lesion identified. Within normal limits in parenchymal echogenicity. IMPRESSION: Numerous gallstones are seen within a moderately distended appearing gallbladder with trace pericholecystic fluid. Findings raise concern for acute cholecystitis. Electronically Signed   By: Tollie Eth M.D.   On: 06/19/2016 22:26    Anti-infectives: Anti-infectives    Start     Dose/Rate Route Frequency Ordered Stop   06/20/16 1000  valACYclovir (VALTREX) tablet 1,000 mg     1,000 mg Oral Daily 06/20/16 0300     06/20/16 0015  piperacillin-tazobactam (ZOSYN) IVPB 3.375 g     3.375 g 12.5 mL/hr over 240 Minutes Intravenous Every 8 hours 06/20/16 0006     06/19/16 2245  cefTRIAXone (ROCEPHIN) 2 g in dextrose 5 % 50 mL IVPB     2 g 100 mL/hr over 30 Minutes Intravenous  Once 06/19/16 2231 06/19/16 2310       Assessment/Plan Cholelithiasis with recurrent nausea, vomiting and abdominal pain - no history of prior abdominal surgery - u/s 4/25 showed numerous gallstones are seen within a moderately distended appearing gallbladder with trace pericholecystic fluid - MRCP 4/26 showed possible choledocholithiasis, dilated common bile duct, cholelithiasis, mild diffuse gallbladder wall thickening - transaminases elevated, bilirubin WNL today - ERCP earlier today: common bile duct was mildly dilated, no stones, otherwise normal; sphincterotomy performed  HIV  (CD 4> 800, viral load low)  Dr. Daiva Eves - not on medications HSV 1/HSV 2 Hypertension Hx of Schizophrenia/psychosis/paranoid  - controlled on Medications Hx of Sickle cell trait Depression  ID - zosyn 4/26>>, valtrex 4/26>> FEN - clear liquids today, NPO after midnight VTE - SCDs  Plan:  Ok for clear liquids today, NPO after midnight. Will plan for  lap chole with possible IOC tomorrow. Continue zosyn.   LOS: 1 day    Edson Snowball , [facility] Surgery 06/21/2016, 12:12 PM Pager:  [phone] Consults: [phone] Mon-Fri 7:00 am-4:30 pm Sat-Sun 7:00 am-11:30 am  Agree with above.  I discussed with the patient the indications and risks of gall bladder surgery.  The primary risks of gall bladder surgery include, but are not limited to, bleeding, infection, common bile duct injury, and open surgery.  There is also the risk that the patient may have continued symptoms after surgery.  We discussed the typical post-operative recovery course. I tried to answer the patient's questions.  Ovidio Kin, MD, [facility] Surgery Pager: [phone] Office phone:  [phone]

## 2016-06-21 NOTE — Anesthesia Preprocedure Evaluation (Signed)
Anesthesia Evaluation  Patient identified by MRN, date of birth, ID band Patient awake    Reviewed: Allergy & Precautions, NPO status , Patient's Chart, lab work & pertinent test results  Airway Mallampati: I  TM Distance: >3 FB Neck ROM: Full    Dental   Pulmonary    Pulmonary exam normal        Cardiovascular hypertension, Pt. on medications Normal cardiovascular exam     Neuro/Psych Depression Schizophrenia    GI/Hepatic   Endo/Other    Renal/GU      Musculoskeletal   Abdominal   Peds  Hematology   Anesthesia Other Findings   Reproductive/Obstetrics                             Anesthesia Physical Anesthesia Plan  ASA: III  Anesthesia Plan: General   Post-op Pain Management:    Induction: Intravenous  Airway Management Planned: Oral ETT  Additional Equipment:   Intra-op Plan:   Post-operative Plan: Extubation in OR  Informed Consent: I have reviewed the patients History and Physical, chart, labs and discussed the procedure including the risks, benefits and alternatives for the proposed anesthesia with the patient or authorized representative who has indicated his/her understanding and acceptance.     Plan Discussed with: CRNA and Surgeon  Anesthesia Plan Comments:         Anesthesia Quick Evaluation

## 2016-06-21 NOTE — H&P (View-Only) (Signed)
MRCP shows possible CBD stone (3mm) in distal duct.  Repeat LFT's not improved.  I feel pt should have ERCP, and I have discussed the nature, purpose and risks of that procedure in detail with the aid of a diagram, and pt is agreeable.  Risks include a roughly 1-5% chance of pancreatitis which can rarely be fatal, anesthesia problems, infection, bleeding, perforation, need for emergent corrective surgery.  Due to scheduling conflicts, have requested that Dr. Carl Gessner of [facility] do the procedure, and he has kindly consented to do so.  This will allow the procedure to be done sometime tomorrow (exact time TBA), thereby expediting patient care.  Klair Leising V. Alison Breeding, M.D. Pager [phone] If no answer or after 5 PM call [phone]   

## 2016-06-21 NOTE — Progress Notes (Signed)
Pt was seen in [facility] about an hour post procedure, and was comfortable and looked good.  Dr. Randa Evens will round on pt for [place] tomorrow.  Call us in the meantime if questions.  Florencia Reasons, M.D. Pager [phone] If no answer or after 5 PM call [phone]

## 2016-06-21 NOTE — H&P (View-Only) (Signed)
Addendum to earlier note:  Labs ordered for tomorrow.  D/w Dr. Newman.  Amiliah Campisi V. Tiann Saha, M.D. Pager [phone] If no answer or after 5 PM call [phone]   

## 2016-06-22 ENCOUNTER — Encounter (HOSPITAL_COMMUNITY)
Admission: EM | Disposition: A | Discharge status: Home / Self Care | Payer: Self-pay | Source: Home / Self Care | Attending: Internal Medicine

## 2016-06-22 ENCOUNTER — Inpatient Hospital Stay (HOSPITAL_COMMUNITY): Payer: Self-pay | Admitting: Certified Registered Nurse Anesthetist

## 2016-06-22 ENCOUNTER — Encounter (HOSPITAL_COMMUNITY): Payer: Self-pay | Admitting: Certified Registered Nurse Anesthetist

## 2016-06-22 ENCOUNTER — Inpatient Hospital Stay (HOSPITAL_COMMUNITY): Payer: Self-pay

## 2016-06-22 DIAGNOSIS — K8042 Calculus of bile duct with acute cholecystitis without obstruction: Secondary | ICD-10-CM

## 2016-06-22 HISTORY — PX: CHOLECYSTECTOMY: SHX55

## 2016-06-22 LAB — COMPREHENSIVE METABOLIC PANEL
ALT: 256 U/L — ABNORMAL HIGH (ref 14–54)
AST: 50 U/L — ABNORMAL HIGH (ref 15–41)
Albumin: 3.4 g/dL — ABNORMAL LOW (ref 3.5–5.0)
Alkaline Phosphatase: 118 U/L (ref 38–126)
Anion gap: 6 (ref 5–15)
BUN: 5 mg/dL — ABNORMAL LOW (ref 6–20)
CO2: 22 mmol/L (ref 22–32)
Calcium: 8.6 mg/dL — ABNORMAL LOW (ref 8.9–10.3)
Chloride: 108 mmol/L (ref 101–111)
Creatinine, Ser: 0.77 mg/dL (ref 0.44–1.00)
GFR calc Af Amer: >60 mL/min (ref >60)
GFR calc non Af Amer: >60 mL/min (ref >60)
Glucose, Bld: 98 mg/dL (ref 65–99)
Potassium: 3.6 mmol/L (ref 3.5–5.1)
Sodium: 136 mmol/L (ref 135–145)
Total Bilirubin: 0.6 mg/dL (ref 0.3–1.2)
Total Protein: 6.6 g/dL (ref 6.5–8.1)

## 2016-06-22 LAB — CBC
HCT: 34 % — ABNORMAL LOW (ref 36.0–46.0)
Hemoglobin: 11.9 g/dL — ABNORMAL LOW (ref 12.0–15.0)
MCH: 29.5 pg (ref 26.0–34.0)
MCHC: 35 g/dL (ref 30.0–36.0)
MCV: 84.2 fL (ref 78.0–100.0)
Platelets: 238 10*3/uL (ref 150–400)
RBC: 4.04 MIL/uL (ref 3.87–5.11)
RDW: 14 % (ref 11.5–15.5)
WBC: 4.6 10*3/uL (ref 4.0–10.5)

## 2016-06-22 LAB — SURGICAL PCR SCREEN
MRSA, PCR: NEGATIVE
Staphylococcus aureus: NEGATIVE

## 2016-06-22 LAB — GLUCOSE, CAPILLARY: Glucose-Capillary: 93 mg/dL (ref 65–99)

## 2016-06-22 LAB — AMYLASE: Amylase: 63 U/L (ref 28–100)

## 2016-06-22 LAB — LIPASE, BLOOD: Lipase: 17 U/L (ref 11–51)

## 2016-06-22 SURGERY — LAPAROSCOPIC CHOLECYSTECTOMY WITH INTRAOPERATIVE CHOLANGIOGRAM
Anesthesia: General | Site: Abdomen

## 2016-06-22 MED ORDER — DIPHENHYDRAMINE HCL 50 MG/ML IJ SOLN: 12.5000 mg | Freq: Once | INTRAMUSCULAR | Status: DC

## 2016-06-22 MED ORDER — HYDROCODONE-ACETAMINOPHEN 5-325 MG PO TABS
1.0000 | ORAL_TABLET | ORAL | Status: DC | PRN
  Administered 2016-06-22 – 2016-06-23 (×3): 2 via ORAL
  Filled 2016-06-22 (×3): qty 2

## 2016-06-22 MED ORDER — PHENYLEPHRINE 40 MCG/ML (10ML) SYRINGE FOR IV PUSH (FOR BLOOD PRESSURE SUPPORT)
PREFILLED_SYRINGE | INTRAVENOUS | Status: DC | PRN
  Administered 2016-06-22: 80 ug via INTRAVENOUS

## 2016-06-22 MED ORDER — DIPHENHYDRAMINE HCL 50 MG/ML IJ SOLN
12.5000 mg | Freq: Once | INTRAMUSCULAR | Status: AC
  Administered 2016-06-22: 12.5 mg via INTRAVENOUS

## 2016-06-22 MED ORDER — EPHEDRINE 5 MG/ML INJ
INTRAVENOUS | Status: AC
  Filled 2016-06-22: qty 10

## 2016-06-22 MED ORDER — BUPIVACAINE HCL (PF) 0.25 % IJ SOLN
INTRAMUSCULAR | Status: DC | PRN
  Administered 2016-06-22: 30 mL

## 2016-06-22 MED ORDER — ONDANSETRON HCL 4 MG/2ML IJ SOLN
4.0000 mg | Freq: Once | INTRAMUSCULAR | Status: AC | PRN
  Administered 2016-06-22: 4 mg via INTRAVENOUS

## 2016-06-22 MED ORDER — FENTANYL CITRATE (PF) 100 MCG/2ML IJ SOLN
INTRAMUSCULAR | Status: AC
  Filled 2016-06-22: qty 2

## 2016-06-22 MED ORDER — FENTANYL CITRATE (PF) 100 MCG/2ML IJ SOLN
INTRAMUSCULAR | Status: DC | PRN
  Administered 2016-06-22: 50 ug via INTRAVENOUS
  Administered 2016-06-22: 100 ug via INTRAVENOUS
  Administered 2016-06-22 (×4): 50 ug via INTRAVENOUS
  Administered 2016-06-22: 100 ug via INTRAVENOUS

## 2016-06-22 MED ORDER — EPHEDRINE SULFATE-NACL 50-0.9 MG/10ML-% IV SOSY
PREFILLED_SYRINGE | INTRAVENOUS | Status: DC | PRN
  Administered 2016-06-22: 10 mg via INTRAVENOUS

## 2016-06-22 MED ORDER — FENTANYL CITRATE (PF) 250 MCG/5ML IJ SOLN
INTRAMUSCULAR | Status: AC
  Filled 2016-06-22: qty 5

## 2016-06-22 MED ORDER — PROPOFOL 10 MG/ML IV BOLUS
INTRAVENOUS | Status: AC
  Filled 2016-06-22: qty 40

## 2016-06-22 MED ORDER — ROCURONIUM BROMIDE 10 MG/ML (PF) SYRINGE
PREFILLED_SYRINGE | INTRAVENOUS | Status: DC | PRN
  Administered 2016-06-22: 50 mg via INTRAVENOUS

## 2016-06-22 MED ORDER — PHENYLEPHRINE 40 MCG/ML (10ML) SYRINGE FOR IV PUSH (FOR BLOOD PRESSURE SUPPORT)
PREFILLED_SYRINGE | INTRAVENOUS | Status: AC
  Filled 2016-06-22: qty 10

## 2016-06-22 MED ORDER — IOPAMIDOL (ISOVUE-300) INJECTION 61%
INTRAVENOUS | Status: AC
  Filled 2016-06-22: qty 50

## 2016-06-22 MED ORDER — MEPERIDINE HCL 50 MG/ML IJ SOLN: 6.2500 mg | INTRAMUSCULAR | Status: DC | PRN

## 2016-06-22 MED ORDER — LACTATED RINGERS IV SOLN
INTRAVENOUS | Status: DC | PRN
  Administered 2016-06-22 (×3): via INTRAVENOUS

## 2016-06-22 MED ORDER — DIPHENHYDRAMINE HCL 50 MG/ML IJ SOLN
INTRAMUSCULAR | Status: AC
  Administered 2016-06-22: 12.5 mg
  Filled 2016-06-22: qty 1

## 2016-06-22 MED ORDER — ONDANSETRON HCL 4 MG/2ML IJ SOLN
INTRAMUSCULAR | Status: AC
Start: 2016-06-22 — End: 2016-06-22
  Filled 2016-06-22: qty 2

## 2016-06-22 MED ORDER — SUGAMMADEX SODIUM 200 MG/2ML IV SOLN
INTRAVENOUS | Status: DC | PRN
  Administered 2016-06-22: 150 mg via INTRAVENOUS

## 2016-06-22 MED ORDER — BUPIVACAINE HCL (PF) 0.25 % IJ SOLN
INTRAMUSCULAR | Status: AC
  Filled 2016-06-22: qty 30

## 2016-06-22 MED ORDER — 0.9 % SODIUM CHLORIDE (POUR BTL) OPTIME
TOPICAL | Status: DC | PRN
  Administered 2016-06-22: 1000 mL

## 2016-06-22 MED ORDER — LACTATED RINGERS IR SOLN
Status: DC | PRN
  Administered 2016-06-22: 1000 mL

## 2016-06-22 MED ORDER — MIDAZOLAM HCL 5 MG/5ML IJ SOLN
INTRAMUSCULAR | Status: DC | PRN
  Administered 2016-06-22: 2 mg via INTRAVENOUS

## 2016-06-22 MED ORDER — PROPOFOL 10 MG/ML IV BOLUS
INTRAVENOUS | Status: DC | PRN
  Administered 2016-06-22: 150 mg via INTRAVENOUS

## 2016-06-22 MED ORDER — SODIUM CHLORIDE 0.9 % IV SOLN
INTRAVENOUS | Status: DC | PRN
  Administered 2016-06-22: 4 mL

## 2016-06-22 MED ORDER — HYDROMORPHONE HCL 1 MG/ML IJ SOLN
INTRAMUSCULAR | Status: AC
  Filled 2016-06-22: qty 1

## 2016-06-22 MED ORDER — HYDROMORPHONE HCL 1 MG/ML IJ SOLN
0.2500 mg | INTRAMUSCULAR | Status: DC | PRN
  Administered 2016-06-22 (×2): 0.5 mg via INTRAVENOUS

## 2016-06-22 MED ORDER — HYDROXYZINE HCL 25 MG PO TABS
25.0000 mg | ORAL_TABLET | Freq: Four times a day (QID) | ORAL | Status: DC | PRN
  Administered 2016-06-22: 25 mg via ORAL
  Filled 2016-06-22: qty 1

## 2016-06-22 MED ORDER — MIDAZOLAM HCL 2 MG/2ML IJ SOLN
INTRAMUSCULAR | Status: AC
  Filled 2016-06-22: qty 2

## 2016-06-22 SURGICAL SUPPLY — 42 items
ADH SKN CLS APL DERMABOND .7 (GAUZE/BANDAGES/DRESSINGS) ×1
APL SKNCLS STERI-STRIP NONHPOA (GAUZE/BANDAGES/DRESSINGS)
APPLIER CLIP 5 13 M/L LIGAMAX5 (MISCELLANEOUS)
APPLIER CLIP ROT 10 11.4 M/L (STAPLE)
APR CLP MED LRG 11.4X10 (STAPLE)
APR CLP MED LRG 5 ANG JAW (MISCELLANEOUS)
BAG SPEC RTRVL LRG 6X4 10 (ENDOMECHANICALS) ×1
BENZOIN TINCTURE PRP APPL 2/3 (GAUZE/BANDAGES/DRESSINGS) IMPLANT
CABLE HIGH FREQUENCY MONO STRZ (ELECTRODE) ×2 IMPLANT
CHLORAPREP W/TINT 26ML (MISCELLANEOUS) ×2 IMPLANT
CHOLANGIOGRAM CATH TAUT (CATHETERS) ×2 IMPLANT
CLIP APPLIE 5 13 M/L LIGAMAX5 (MISCELLANEOUS) IMPLANT
CLIP APPLIE ROT 10 11.4 M/L (STAPLE) IMPLANT
COVER MAYO STAND STRL (DRAPES) ×2 IMPLANT
COVER SURGICAL LIGHT HANDLE (MISCELLANEOUS) ×2 IMPLANT
DECANTER SPIKE VIAL GLASS SM (MISCELLANEOUS) ×2 IMPLANT
DERMABOND ADVANCED (GAUZE/BANDAGES/DRESSINGS) ×1
DERMABOND ADVANCED .7 DNX12 (GAUZE/BANDAGES/DRESSINGS) IMPLANT
DRAPE C-ARM 42X120 X-RAY (DRAPES) ×2 IMPLANT
ELECT REM PT RETURN 15FT ADLT (MISCELLANEOUS) ×2 IMPLANT
GLOVE SURG SIGNA 7.5 PF LTX (GLOVE) ×2 IMPLANT
GOWN STRL REUS W/TWL XL LVL3 (GOWN DISPOSABLE) ×6 IMPLANT
HEMOSTAT SURGICEL 4X8 (HEMOSTASIS) IMPLANT
IRRIG SUCT STRYKERFLOW 2 WTIP (MISCELLANEOUS) ×2
IRRIGATION SUCT STRKRFLW 2 WTP (MISCELLANEOUS) ×1 IMPLANT
IV CATH 14GX2 1/4 (CATHETERS) ×2 IMPLANT
IV SET EXTENSION CATH 6 NF (IV SETS) ×2 IMPLANT
KIT BASIN OR (CUSTOM PROCEDURE TRAY) ×2 IMPLANT
POUCH SPECIMEN RETRIEVAL 10MM (ENDOMECHANICALS) ×2 IMPLANT
SCISSORS LAP 5X35 DISP (ENDOMECHANICALS) ×2 IMPLANT
SLEEVE ADV FIXATION 5X100MM (TROCAR) ×2 IMPLANT
STOPCOCK 4 WAY LG BORE MALE ST (IV SETS) ×2 IMPLANT
STRIP CLOSURE SKIN 1/4X4 (GAUZE/BANDAGES/DRESSINGS) IMPLANT
SUT MNCRL AB 4-0 PS2 18 (SUTURE) ×2 IMPLANT
SYR 10ML ECCENTRIC (SYRINGE) ×2 IMPLANT
TOWEL OR 17X26 10 PK STRL BLUE (TOWEL DISPOSABLE) ×2 IMPLANT
TOWEL OR NON WOVEN STRL DISP B (DISPOSABLE) ×2 IMPLANT
TRAY LAPAROSCOPIC (CUSTOM PROCEDURE TRAY) ×2 IMPLANT
TROCAR ADV FIXATION 11X100MM (TROCAR) IMPLANT
TROCAR ADV FIXATION 5X100MM (TROCAR) ×2 IMPLANT
TROCAR XCEL BLUNT TIP 100MML (ENDOMECHANICALS) ×2 IMPLANT
TUBING INSUF HEATED (TUBING) ×2 IMPLANT

## 2016-06-22 NOTE — Progress Notes (Signed)
Ready for surgery today.  Ovidio Kin, MD, [facility] Surgery Pager: [phone] Office phone:  [phone]

## 2016-06-22 NOTE — Transfer of Care (Signed)
Immediate Anesthesia Transfer of Care Note  Patient: Adriana Fernandez  Procedure(s) Performed: Procedure(s): LAPAROSCOPIC CHOLECYSTECTOMY WITH INTRAOPERATIVE CHOLANGIOGRAM (N/A)  Patient Location: PACU  Anesthesia Type:General  Level of Consciousness: sedated, patient cooperative and responds to stimulation  Airway & Oxygen Therapy: Patient Spontanous Breathing and Patient connected to face mask oxygen  Post-op Assessment: Report given to RN and Post -op Vital signs reviewed and stable  Post vital signs: Reviewed and stable  Last Vitals:  Vitals:   06/21/16 2028 06/22/16 0430  BP: (!) 91/54 (!) 90/53  Pulse: 66 67  Resp: 18 18  Temp: 36.9 C 36.8 C    Last Pain:  Vitals:   06/22/16 0430  TempSrc: Oral  PainSc:       Patients Stated Pain Goal: 1 (06/21/16 1147)  Complications: No apparent anesthesia complications

## 2016-06-22 NOTE — Progress Notes (Addendum)
PROGRESS NOTE  Adriana Fernandez GEX:[id number] DOB: 03-28-1970 DOA: 06/19/2016 PCP: Jackie Plum, MD  Brief History: 46 year old female with a history of hypertension, HIV, paranoid schizophrenia, sickle cell trait presented with one-day history of abdominal pain that began on afternoon of April 25, 2018with associated nausea and vomiting. The patient visited the emergency department more 06/13/2016 with same symptoms. She was discharged home in stable condition. She returned to the ED again yesterday 06/19/16 with abdominal pain after eating tacos with ground [place] and tomatoes.She has a history of gallstones was referred to surgery but is having difficulty getting into the office for evaluation. In the emergency department, the patient was afebrile and he mechanically stable but hasincreasing liver enzymes with AST 221, ALT 404, alkaline phosphatase 126, total bilirubin 0.8, lipase 60. Gastroenterology and general surgery were consulted to assist with management. There was some concern of choledocholithiasis because of elevated bilirubin up to 1.4 at the time of admission. Abdominal ultrasound showed cholelithiasis with moderate gallbladder distention and trace pericholecystic fluid concerning for cholecystitis.  Assessment/Plan: Symptomatic cholelithiasis/Chronic Cholecystitis -Initially Concerned about acute cholecystitis -Continue Zosyn through today -06/19/2016 abdominal ultrasound--cholelithiasis with GBdistention and trace pericholecystic fluid concerning for acute cholecystitis -Appreciate GI consult-->MRCP to rule out choledocholithiasis -06/20/16 MRCP-->solitary 3 mm choledocholithiasis at the junction of CBDand amp along with mild dilated common bile duct; diffuse central intrahepatic biliary ductal dilatation; cholelithiasis with mild diffuse gallbladder wall thickening -06/21/16 ERCP--no bile duct stone. dilated biliary tree up to 8mm; sphincterotomy -Continue  IV fluids -Appreciate general surgery followup -06/22/16--lap chole, neg IOC -advance diet per surgery  HIV -likely chronic non-progressor -04/11/2016--CD4--800/44%; HIV on an undetectable  Hypertension -Holding amlodipine and HCTZ secondary to soft blood pressure-->BP remains stable  Paranoid schizophrenia -Continue InVega  Depression -Continue Wellbutrin    Disposition Plan: Home 4/29 if stable and cleared by surgery Family Communication: NoFamily at bedside  Consultants: [facility], General Surgery  Code Status: FULL   DVT Prophylaxis: SCDs   Procedures: As Listed in Progress Note Above  Antibiotics: Zosyn 4/25>>    Subjective: Patient denies fevers, chills, headache, chest pain, dyspnea, nausea, vomiting, diarrhea,  dysuria, hematuria, hematochezia, and melena.  She has incisional surgical pain controlled by opioids.   Objective: Vitals:   06/22/16 1045 06/22/16 1103 06/22/16 1104 06/22/16 1433  BP: 107/68 123/82 123/82 122/66  Pulse: 64 (!) 109 (!) 109 77  Resp: 18 16 18 18   Temp: 97.7 F (36.5 C) 97.6 F (36.4 C) 97.6 F (36.4 C) 99 F (37.2 C)  TempSrc:   Oral Oral  SpO2: 100% 99% 99% 99%  Weight:      Height:        Intake/Output Summary (Last 24 hours) at 06/22/16 1627 Last data filed at 06/22/16 1600  Gross per 24 hour  Intake             4150 ml  Output               10 ml  Net             4140 ml   Weight change:  Exam:   General:  Pt is alert, follows commands appropriately, not in acute distress  HEENT: No icterus, No thrush, No neck mass, [place]/AT  Cardiovascular: RRR, S1/S2, no rubs, no gallops  Respiratory: CTA bilaterally, no wheezing, no crackles, no rhonchi  Abdomen: Soft/+BS, mild tender around incisions, non distended, no guarding  Extremities: No edema, No lymphangitis,  No petechiae, No rashes, no synovitis   Data Reviewed: I have personally reviewed following labs and imaging studies Basic  Metabolic Panel:  Recent Labs Lab 06/19/16 1923 06/20/16 1020 06/21/16 0649 06/21/16 0951 06/22/16 0644  NA 137 140 140 138 136  K 3.3* 3.9 4.2 4.5 3.6  CL 102 110 111 108 108  CO2 GLUCOSE 134* 89 86 119* 98  BUN 9 6 <5* 5* 5*  CREATININE 0.76 0.66 0.63 0.73 0.77  CALCIUM 9.0 8.5* 8.8* 9.0 8.6*   Liver Function Tests:  Recent Labs Lab 06/19/16 1923 06/20/16 1020 06/21/16 0649 06/21/16 0951 06/22/16 0644  AST 221* 188* 80* 94* 50*  ALT 404* 470* 312* 349* 256*  ALKPHOS 146* 147* 128* 140* 118  BILITOT 0.8 0.6 0.6 0.5 0.6  PROT 7.8 6.9 6.2* 7.1 6.6  ALBUMIN 3.9 3.5 3.2* 3.5 3.4*    Recent Labs Lab 06/19/16 1923 06/20/16 1020 06/22/16 0644  LIPASE 60* 15 17  AMYLASE  --   --  63   No results for input(s): AMMONIA in the last 168 hours. Coagulation Profile:  Recent Labs Lab 06/20/16 0759 06/21/16 0649  INR 1.04 1.11   CBC:  Recent Labs Lab 06/19/16 1923 06/20/16 1020 06/21/16 0649 06/22/16 0644  WBC 6.5 4.7 4.0 4.6  NEUTROABS 4.5  --  1.8  --   HGB 12.9 11.8* 11.2* 11.9*  HCT 37.1 34.3* 32.1* 34.0*  MCV 84.3 86.0 84.7 84.2  PLT 254 234 222 238   Cardiac Enzymes: No results for input(s): CKTOTAL, CKMB, CKMBINDEX, TROPONINI in the last 168 hours. BNP: Invalid input(s): POCBNP CBG:  Recent Labs Lab 06/22/16 0725  GLUCAP 93   HbA1C: No results for input(s): HGBA1C in the last 72 hours. Urine analysis:    Component Value Date/Time   COLORURINE YELLOW 06/19/2016 1919   APPEARANCEUR CLEAR 06/19/2016 1919   LABSPEC 1.016 06/19/2016 1919   PHURINE 7.0 06/19/2016 1919   GLUCOSEU NEGATIVE 06/19/2016 1919   HGBUR NEGATIVE 06/19/2016 1919   BILIRUBINUR NEGATIVE 06/19/2016 1919   KETONESUR NEGATIVE 06/19/2016 1919   PROTEINUR NEGATIVE 06/19/2016 1919   UROBILINOGEN 1 11/09/2012 1006   NITRITE NEGATIVE 06/19/2016 1919   LEUKOCYTESUR NEGATIVE 06/19/2016 1919   Sepsis Labs: (procalcitonin:4,lacticidven:4) ) Recent  Results (from the past 240 hour(s))  Surgical PCR screen     Status: None   Collection Time: 06/21/16 10:29 PM  Result Value Ref Range Status   MRSA, PCR NEGATIVE NEGATIVE Final   Staphylococcus aureus NEGATIVE NEGATIVE Final    Comment:        The Xpert SA Assay (FDA approved for NASAL specimens in patients over 42 years of age), is one component of a comprehensive surveillance program.  Test performance has been validated by [facility] for patients greater than or equal to 19 year old. It is not intended to diagnose infection nor to guide or monitor treatment.      Scheduled Meds: . buPROPion  150 mg Oral BID  . Chlorhexidine Gluconate Cloth  6 each Topical Once  . dicyclomine  20 mg Oral BID  . HYDROmorphone      . indomethacin  100 mg Rectal Once  . paliperidone  3 mg Oral Daily  . valACYclovir  1,000 mg Oral Daily   Continuous Infusions: . 0.9 % NaCl with KCl 20 mEq / L 75 mL/hr at 06/22/16 0500  . piperacillin-tazobactam (ZOSYN)  IV 3.375 g (06/22/16 1413)    Procedures/Studies: Dg  Cholangiogram Operative  Result Date: 06/22/2016 CLINICAL DATA:  46 year old female with a history of cholelithiasis EXAM: INTRAOPERATIVE CHOLANGIOGRAM TECHNIQUE: Cholangiographic images from the C-arm fluoroscopic device were submitted for interpretation post-operatively. Please see the procedural report for the amount of contrast and the fluoroscopy time utilized. COMPARISON:  ERCP 06/21/2016 FINDINGS: Surgical instruments project over the upper abdomen. There is cannulation of the cystic duct/gallbladder neck, with antegrade infusion of contrast. Caliber of the extrahepatic ductal system within normal limits. No large filling defect identified. Free flow of contrast across the ampulla. IMPRESSION: Intraoperative cholangiogram demonstrates extrahepatic biliary ducts of unremarkable caliber, with no large filling defect identified. Free flow of contrast across the ampulla. Please refer to the  dictated operative report for full details of intraoperative findings and procedure Electronically Signed   By: Gilmer Mor D.O.   On: 06/22/2016 09:07   Mr 3d Recon At Scanner  Result Date: 06/20/2016 CLINICAL DATA:  Inpatient. Abdominal pain, nausea and vomiting. Cholelithiasis with concern for acute cholecystitis at sonography. EXAM: MRI ABDOMEN WITHOUT AND WITH CONTRAST (INCLUDING MRCP) TECHNIQUE: Multiplanar multisequence MR imaging of the abdomen was performed both before and after the administration of intravenous contrast. Heavily T2-weighted images of the biliary and pancreatic ducts were obtained, and three-dimensional MRCP images were rendered by post processing. CONTRAST:  14mL MULTIHANCE GADOBENATE DIMEGLUMINE 529 MG/ML IV SOLN COMPARISON:  06/19/2016 right upper quadrant abdominal sonogram. FINDINGS: Lower chest: Small pericardial effusion/ thickening. Trace dependent bilateral pleural effusions. Hepatobiliary: Normal liver size and configuration. Minimal diffuse hepatic steatosis. No liver mass. Mildly distended gallbladder. Numerous subcentimeter gallstones layering in the gallbladder. Mild diffuse gallbladder wall thickening. No pericholecystic fluid. Mild diffuse central intrahepatic biliary ductal dilatation. Common bile duct diameter 7 mm, mildly dilated. There is a solitary round 3 mm filling defect at the junction of the common bile duct and ampulla (series 4/ image 39), compatible with a small stone. No enhancing biliary or ampullary mass. Pancreas: No pancreatic mass or duct dilation.  No pancreas divisum. Spleen: Normal size spleen. There are several (at least 5) small T2 hyperintense lesions scattered throughout the spleen, largest 1.3 cm, a few of which demonstrate thin internal septations, with no solid enhancement, compatible with benign splenic lymphangiomas. Adrenals/Urinary Tract: Normal adrenals. No hydronephrosis. There is a 1.1 x 1.0 cm renal cortical lesion in the upper  right kidney (series 1303/ image 54) with thin internal septation and no solid enhancement, compatible with a Bosniak category 2 renal cyst. Additional subcentimeter simple renal cysts in both kidneys. No suspicious renal masses. Stomach/Bowel: Grossly normal stomach. Visualized small and large bowel is normal caliber, with no bowel wall thickening. Vascular/Lymphatic: Normal caliber abdominal aorta. Patent portal, splenic, hepatic and renal veins. No pathologically enlarged lymph nodes in the abdomen. Other: No abdominal ascites or focal fluid collection. Musculoskeletal: No aggressive appearing focal osseous lesions. IMPRESSION: 1. Suggestion of a solitary 3 mm choledocholith at the junction of the common bile duct and ampulla, with mildly dilated common bile duct (7 mm diameter) and mild diffuse central intrahepatic biliary ductal dilatation . 2. Cholelithiasis. Mildly distended gallbladder. Mild diffuse gallbladder wall thickening. Acute cholecystitis cannot be excluded. 3. Trace dependent bilateral pleural effusions. Small pericardial effusion/thickening. 4. Minimal diffuse hepatic steatosis. 5. Small Bosniak category 1 and category 2 renal cysts. No suspicious renal masses. 6. Small splenic lymphangiomas. Electronically Signed   By: Delbert Phenix M.D.   On: 06/20/2016 11:49   Dg Ercp Biliary & Pancreatic Ducts  Result Date: 06/21/2016 CLINICAL DATA:  Choledocholithiasis by MRCP EXAM: INTRAOPERATIVE CHOLANGIOGRAM TECHNIQUE: Cholangiographic images from the C-arm fluoroscopic device were submitted for interpretation post-operatively. Please see the procedural report for the amount of contrast and the fluoroscopy time utilized. COMPARISON:  06/20/2016 FINDINGS: Limited spot fluoroscopic imaging during the ERCP procedure. Retrograde cholangiogram performed. No significant biliary dilatation, stricture or filling defect. Balloon sweep performed. Contrast refluxes into the cystic duct and gallbladder. IMPRESSION:  Biliary system appears patent. Electronically Signed   By: Judie Petit.  Shick M.D.   On: 06/21/2016 09:03   Dg Abdomen Acute W/chest  Result Date: 06/13/2016 CLINICAL DATA:  Sudden onset of lower abdominal pain.  Vomiting. EXAM: DG ABDOMEN ACUTE W/ 1V CHEST COMPARISON:  Radiographs 05/04/2016 FINDINGS: The cardiomediastinal contours are normal. The lungs are clear. There is no free intra-abdominal air. No dilated bowel loops to suggest obstruction. Small-moderate volume of stool throughout the colon. No radiopaque calculi. Right pelvic phleboliths again seen. IUD in the pelvis. No acute osseous abnormalities are seen. IMPRESSION: 1. Normal bowel gas pattern. No evidence of bowel obstruction or free air. 2. Clear lungs. Electronically Signed   By: Rubye Oaks M.D.   On: 06/13/2016 03:10   Mr Abdomen Mrcp Vivien Rossetti Contast  Result Date: 06/20/2016 CLINICAL DATA:  Inpatient. Abdominal pain, nausea and vomiting. Cholelithiasis with concern for acute cholecystitis at sonography. EXAM: MRI ABDOMEN WITHOUT AND WITH CONTRAST (INCLUDING MRCP) TECHNIQUE: Multiplanar multisequence MR imaging of the abdomen was performed both before and after the administration of intravenous contrast. Heavily T2-weighted images of the biliary and pancreatic ducts were obtained, and three-dimensional MRCP images were rendered by post processing. CONTRAST:  14mL MULTIHANCE GADOBENATE DIMEGLUMINE 529 MG/ML IV SOLN COMPARISON:  06/19/2016 right upper quadrant abdominal sonogram. FINDINGS: Lower chest: Small pericardial effusion/ thickening. Trace dependent bilateral pleural effusions. Hepatobiliary: Normal liver size and configuration. Minimal diffuse hepatic steatosis. No liver mass. Mildly distended gallbladder. Numerous subcentimeter gallstones layering in the gallbladder. Mild diffuse gallbladder wall thickening. No pericholecystic fluid. Mild diffuse central intrahepatic biliary ductal dilatation. Common bile duct diameter 7 mm, mildly  dilated. There is a solitary round 3 mm filling defect at the junction of the common bile duct and ampulla (series 4/ image 39), compatible with a small stone. No enhancing biliary or ampullary mass. Pancreas: No pancreatic mass or duct dilation.  No pancreas divisum. Spleen: Normal size spleen. There are several (at least 5) small T2 hyperintense lesions scattered throughout the spleen, largest 1.3 cm, a few of which demonstrate thin internal septations, with no solid enhancement, compatible with benign splenic lymphangiomas. Adrenals/Urinary Tract: Normal adrenals. No hydronephrosis. There is a 1.1 x 1.0 cm renal cortical lesion in the upper right kidney (series 1303/ image 54) with thin internal septation and no solid enhancement, compatible with a Bosniak category 2 renal cyst. Additional subcentimeter simple renal cysts in both kidneys. No suspicious renal masses. Stomach/Bowel: Grossly normal stomach. Visualized small and large bowel is normal caliber, with no bowel wall thickening. Vascular/Lymphatic: Normal caliber abdominal aorta. Patent portal, splenic, hepatic and renal veins. No pathologically enlarged lymph nodes in the abdomen. Other: No abdominal ascites or focal fluid collection. Musculoskeletal: No aggressive appearing focal osseous lesions. IMPRESSION: 1. Suggestion of a solitary 3 mm choledocholith at the junction of the common bile duct and ampulla, with mildly dilated common bile duct (7 mm diameter) and mild diffuse central intrahepatic biliary ductal dilatation . 2. Cholelithiasis. Mildly distended gallbladder. Mild diffuse gallbladder wall thickening. Acute cholecystitis cannot be excluded. 3. Trace dependent bilateral pleural effusions. Small  pericardial effusion/thickening. 4. Minimal diffuse hepatic steatosis. 5. Small Bosniak category 1 and category 2 renal cysts. No suspicious renal masses. 6. Small splenic lymphangiomas. Electronically Signed   By: Delbert Phenix M.D.   On: 06/20/2016  11:49   US Abdomen Limited Ruq  Result Date: 06/19/2016 CLINICAL DATA:  Intermittent right upper quadrant pain times several months, latest episode x5 hours. EXAM: US ABDOMEN LIMITED - RIGHT UPPER QUADRANT COMPARISON:  None. FINDINGS: Gallbladder: Numerous layering gallstones are seen within the gallbladder and at the neck of the gallbladder as well the largest measuring 1.1 cm. Gallbladder wall is top normal at 2.5 mm in thickness. Trace pericholecystic edema and fluid. No sonographic Murphy's however the patient was reportedly on pain medication. Common bile duct: Diameter: 7 mm.  No choledocholithiasis. Liver: No focal lesion identified. Within normal limits in parenchymal echogenicity. IMPRESSION: Numerous gallstones are seen within a moderately distended appearing gallbladder with trace pericholecystic fluid. Findings raise concern for acute cholecystitis. Electronically Signed   By: Tollie Eth M.D.   On: 06/19/2016 22:26    Rejina Odle, DO  [facility] Pager [phone]  If 7PM-7AM, please contact night-coverage [email or web address] Password [email or web address] 06/22/2016, 4:27 PM   LOS: 2 days

## 2016-06-22 NOTE — Anesthesia Postprocedure Evaluation (Signed)
Anesthesia Post Note  Patient: Adriana Fernandez  Procedure(s) Performed: Procedure(s) (LRB): LAPAROSCOPIC CHOLECYSTECTOMY WITH INTRAOPERATIVE CHOLANGIOGRAM (N/A)  Patient location during evaluation: PACU Anesthesia Type: General Level of consciousness: awake and alert Pain management: pain level controlled Vital Signs Assessment: post-procedure vital signs reviewed and stable Respiratory status: spontaneous breathing, nonlabored ventilation, respiratory function stable and patient connected to nasal cannula oxygen Cardiovascular status: blood pressure returned to baseline and stable Postop Assessment: no signs of nausea or vomiting Anesthetic complications: no       Last Vitals:  Vitals:   06/22/16 1104 06/22/16 1433  BP: 123/82 122/66  Pulse: (!) 109 77  Resp: 18 18  Temp: 36.4 C 37.2 C    Last Pain:  Vitals:   06/22/16 1506  TempSrc:   PainSc: 3                  Kastiel Simonian DAVID

## 2016-06-22 NOTE — Anesthesia Procedure Notes (Signed)
Procedure Name: Intubation Performed by: Gean Maidens Pre-anesthesia Checklist: Patient identified, Emergency Drugs available, Suction available, Patient being monitored and Timeout performed Patient Re-evaluated:Patient Re-evaluated prior to inductionOxygen Delivery Method: Circle system utilized Preoxygenation: Pre-oxygenation with 100% oxygen Intubation Type: IV induction Ventilation: Mask ventilation without difficulty Laryngoscope Size: Mac and 3 Grade View: Grade I Tube type: Oral Tube size: 7.0 mm Number of attempts: 1 Airway Equipment and Method: Stylet Placement Confirmation: ETT inserted through vocal cords under direct vision,  positive ETCO2,  CO2 detector and breath sounds checked- equal and bilateral Secured at: 21 cm Tube secured with: Tape Dental Injury: Teeth and Oropharynx as per pre-operative assessment

## 2016-06-22 NOTE — Anesthesia Preprocedure Evaluation (Signed)
Anesthesia Evaluation  Patient identified by MRN, date of birth, ID band Patient awake    Reviewed: Allergy & Precautions, NPO status , Patient's Chart, lab work & pertinent test results  Airway Mallampati: I  TM Distance: >3 FB Neck ROM: Full    Dental   Pulmonary    Pulmonary exam normal        Cardiovascular hypertension, Pt. on medications Normal cardiovascular exam     Neuro/Psych Depression Schizophrenia    GI/Hepatic   Endo/Other    Renal/GU      Musculoskeletal   Abdominal   Peds  Hematology   Anesthesia Other Findings   Reproductive/Obstetrics                             Anesthesia Physical Anesthesia Plan  ASA: III  Anesthesia Plan: General   Post-op Pain Management:    Induction: Intravenous  Airway Management Planned: Oral ETT  Additional Equipment:   Intra-op Plan:   Post-operative Plan: Extubation in OR  Informed Consent: I have reviewed the patients History and Physical, chart, labs and discussed the procedure including the risks, benefits and alternatives for the proposed anesthesia with the patient or authorized representative who has indicated his/her understanding and acceptance.     Plan Discussed with: CRNA and Surgeon  Anesthesia Plan Comments:         Anesthesia Quick Evaluation  

## 2016-06-22 NOTE — Op Note (Signed)
06/19/2016 - 06/22/2016  9:27 AM  PATIENT:  Adriana Fernandez, 46 y.o., female, MRN: [id number]  PREOP DIAGNOSIS:  Acute cholecystitis  POSTOP DIAGNOSIS:   Chronic cholecystitis, cholelithiasis, stone impacted in common bile duct  PROCEDURE:   Procedure(s): LAPAROSCOPIC CHOLECYSTECTOMY WITH INTRAOPERATIVE CHOLANGIOGRAM  SURGEON:   Ovidio Kin, M.D.  ASSISTANT:   None  ANESTHESIA:   general  Anesthesiologist: Arta Bruce, MD CRNA: Kizzie Fantasia, CRNA  General  ASA: 3  EBL:  minimal  ml  BLOOD ADMINISTERED: none  DRAINS: none   LOCAL MEDICATIONS USED:   30 cc 1/4% marcaine  SPECIMEN:   Gall bladder  COUNTS CORRECT:  YES  INDICATIONS FOR PROCEDURE:  Adriana Fernandez is a 46 y.o. (DOB: 03/11/1970) AA female whose primary care physician is OSEI-BONSU,GEORGE, MD and comes for cholecystectomy.   The indications and risks of the gall bladder surgery were explained to the patient.  The risks include, but are not limited to, infection, bleeding, common bile duct injury and open surgery.  SURGERY:  The patient was taken to OR room #1 at [facility].  The abdomen was prepped with chloroprep.  The patient was on Zosyn prior to the beginning of the operation.   A time out was held and the surgical checklist run.   An infraumbilical incision was made into the abdominal cavity.  A 12 mm Hasson trocar was inserted into the abdominal cavity through the infraumbilical incision and secured with a 0 Vicryl suture.  Three additional trocars were inserted: a 5 mm trocar in the sub-xiphoid location, a 5 mm trocar in the right mid subcostal area, and a 5 mm trocar in the right lateral subcostal area.   The abdomen was explored and the liver, stomach, and bowel that could be seen were unremarkable.   The gall bladder was only mildly inflamed.   I grasped the gall bladder and rotated it cephalad.  Disssection was carried down to the gall bladder/cystic duct junction and the cystic  duct isolated.  A clip was placed on the gall bladder side of the cystic duct.   An intra-operative cholangiogram was shot.  On cutting the cystic duct, she has a stone impacted in the cystic duct that I milked out.   The intra-operative cholangiogram was shot using a cut off Taut catheter placed through a 14 gauge angiocath in the RUQ.  The Taut catheter was inserted in the cut cystic duct and secured with an endoclip.  A cholangiogram was shot with 10 cc of 1/2 strength Isoview.  Using fluoroscopy, the cholangiogram showed the flow of contrast into the common bile duct, up the hepatic radicals, and into the duodenum.  There was no mass or obstruction.  This was a normal intra-operative cholangiogram.   The Taut catheter was removed.  The cystic duct was tripley endoclipped and the cystic artery was identified and clipped.  The gall bladder was bluntly and sharpley dissected from the gall bladder bed.   After the gall bladder was removed from the liver, the gall bladder bed and [place] were inspected.  There was no bleeding or bile leak.  The gall bladder was placed in a endocatch bag and delivered through the umbilicus.  The abdomen was irrigated with 1,000 cc saline.   The trocars were then removed.  I infiltrated 30cc of 1/4% Marcaine into the incisions.  The umbilical port closed with a 0 Vicryl suture and the skin closed with 4-0 Monocryl.  The skin was  painted with DermaBond.  The patient's sponge and needle count were correct.  The patient was transported to the RR in good condition.  Alphonsa Overall, MD, [facility] Surgery Pager: [phone] Office phone:  [phone]

## 2016-06-23 LAB — COMPREHENSIVE METABOLIC PANEL
ALT: 191 U/L — ABNORMAL HIGH (ref 14–54)
AST: 52 U/L — ABNORMAL HIGH (ref 15–41)
Albumin: 3.2 g/dL — ABNORMAL LOW (ref 3.5–5.0)
Alkaline Phosphatase: 110 U/L (ref 38–126)
Anion gap: 6 (ref 5–15)
BUN: 5 mg/dL — ABNORMAL LOW (ref 6–20)
CO2: 23 mmol/L (ref 22–32)
Calcium: 8.7 mg/dL — ABNORMAL LOW (ref 8.9–10.3)
Chloride: 108 mmol/L (ref 101–111)
Creatinine, Ser: 0.75 mg/dL (ref 0.44–1.00)
GFR calc Af Amer: >60 mL/min (ref >60)
GFR calc non Af Amer: >60 mL/min (ref >60)
Glucose, Bld: 130 mg/dL — ABNORMAL HIGH (ref 65–99)
Potassium: 3.7 mmol/L (ref 3.5–5.1)
Sodium: 137 mmol/L (ref 135–145)
Total Bilirubin: 0.5 mg/dL (ref 0.3–1.2)
Total Protein: 5.9 g/dL — ABNORMAL LOW (ref 6.5–8.1)

## 2016-06-23 LAB — CBC
HCT: 32.5 % — ABNORMAL LOW (ref 36.0–46.0)
Hemoglobin: 11.1 g/dL — ABNORMAL LOW (ref 12.0–15.0)
MCH: 29.1 pg (ref 26.0–34.0)
MCHC: 34.2 g/dL (ref 30.0–36.0)
MCV: 85.1 fL (ref 78.0–100.0)
Platelets: 224 10*3/uL (ref 150–400)
RBC: 3.82 MIL/uL — ABNORMAL LOW (ref 3.87–5.11)
RDW: 13.9 % (ref 11.5–15.5)
WBC: 4.9 10*3/uL (ref 4.0–10.5)

## 2016-06-23 LAB — GLUCOSE, CAPILLARY: Glucose-Capillary: 120 mg/dL — ABNORMAL HIGH (ref 65–99)

## 2016-06-23 MED ORDER — HYDROCODONE-ACETAMINOPHEN 5-325 MG PO TABS: 1.0000 | ORAL_TABLET | ORAL | 0 refills | Status: DC | PRN

## 2016-06-23 NOTE — Progress Notes (Signed)
[facility]:  [phone] General Surgery Progress Note   LOS: 3 days  POD -  1 Day Post-Op  Chief Complaint: Abdominal pain   Assessment and Plan: 1.  LAPAROSCOPIC CHOLECYSTECTOMY WITH INTRAOPERATIVE CHOLANGIOGRAM - 06/22/2016 - D. Kelissa Merlin  Sore, but looks good.  Okay to go home from surgery standpoint  2.  ERCP - 06/21/2016 - Gessner  Sphincterotomy done, but no stones 3.  On Zosyn  This can be stopped from surgery's standpoint 4.  HIV   Principal Problem:   Cholelithiasis with acute cholecystitis Active Problems:   Human immunodeficiency virus (HIV) disease (HCC)   Depression   Schizophrenia (HCC)   HSV-1 (herpes simplex virus 1) infection   Hypokalemia   Choledocholithiasis with acute cholecystitis with obstruction   Biliary colic   Subjective:  Sore and has not moved much.  Objective:   Vitals:   06/22/16 2157 06/23/16 0531  BP: (!) 94/48 (!) 109/58  Pulse: 74 65  Resp: 18 18  Temp: 99.1 F (37.3 C) 98.8 F (37.1 C)     Intake/Output from previous day:  04/28 0701 - 04/29 0700 In: 2450 [I.V.:2400; IV Piggyback:50] Out: 10 [Blood:10]  Intake/Output this shift:  Total I/O In: 240 [P.O.:240] Out: -    Physical Exam:   General: AA F who is alert and oriented.    HEENT: Normal. Pupils equal. .   Lungs: clear   Abdomen: Soft   Wound: Clean    Lab Results:    Recent Labs  06/22/16 0644 06/23/16 0657  WBC 4.6 4.9  HGB 11.9* 11.1*  HCT 34.0* 32.5*  PLT 238 224    BMET   Recent Labs  06/22/16 0644 06/23/16 0657  NA 136 137  K 3.6 3.7  CL 108 108  CO2 22 23  GLUCOSE 98 130*  BUN 5* 5*  CREATININE 0.77 0.75  CALCIUM 8.6* 8.7*    PT/INR   Recent Labs  06/21/16 0649  LABPROT 14.4  INR 1.11    ABG  No results for input(s): PHART, HCO3 in the last 72 hours.  Invalid input(s): PCO2, PO2   Studies/Results:  Dg Cholangiogram Operative  Result Date: 06/22/2016 CLINICAL DATA:  46 year old female with a  history of cholelithiasis EXAM: INTRAOPERATIVE CHOLANGIOGRAM TECHNIQUE: Cholangiographic images from the C-arm fluoroscopic device were submitted for interpretation post-operatively. Please see the procedural report for the amount of contrast and the fluoroscopy time utilized. COMPARISON:  ERCP 06/21/2016 FINDINGS: Surgical instruments project over the upper abdomen. There is cannulation of the cystic duct/gallbladder neck, with antegrade infusion of contrast. Caliber of the extrahepatic ductal system within normal limits. No large filling defect identified. Free flow of contrast across the ampulla. IMPRESSION: Intraoperative cholangiogram demonstrates extrahepatic biliary ducts of unremarkable caliber, with no large filling defect identified. Free flow of contrast across the ampulla. Please refer to the dictated operative report for full details of intraoperative findings and procedure Electronically Signed   By: Gilmer Mor D.O.   On: 06/22/2016 09:07     Anti-infectives:   Anti-infectives    Start     Dose/Rate Route Frequency Ordered Stop   06/20/16 1000  valACYclovir (VALTREX) tablet 1,000 mg     1,000 mg Oral Daily 06/20/16 0300     06/20/16 0015  piperacillin-tazobactam (ZOSYN) IVPB 3.375 g     3.375 g 12.5 mL/hr over 240 Minutes Intravenous Every 8 hours 06/20/16 0006     06/19/16 2245  cefTRIAXone (ROCEPHIN) 2 g in dextrose 5 %  50 mL IVPB     2 g 100 mL/hr over 30 Minutes Intravenous  Once 06/19/16 2231 06/19/16 2310      Ovidio Kin, MD, FACS Pager: [phone] Surgery Office: [phone] 06/23/2016

## 2016-06-23 NOTE — Progress Notes (Signed)
[facility] PROGRESS NOTE Subjective Pt had cholecyctectomy yesterday. IOC was ok. Still with some vague abd pain.  Objective: Vital signs in last 24 hours: Temp:  [97.4 F (36.3 C)-99.1 F (37.3 C)] 98.8 F (37.1 C) (04/29 0531) Pulse Rate:  [64-109] 65 (04/29 0531) Resp:  [14-22] 18 (04/29 0531) BP: (94-135)/(48-107) 109/58 (04/29 0531) SpO2:  [96 %-100 %] 96 % (04/29 0531) Last BM Date: 06/18/16  Intake/Output from previous day: 04/28 0701 - 04/29 0700 In: 2450 [I.V.:2400; IV Piggyback:50] Out: 10 [Blood:10] Intake/Output this shift: No intake/output data recorded.  PE: General--no distress  Abdomen--+ BS  Minimal tenderness  Lab Results:  Recent Labs  06/20/16 1020 06/21/16 0649 06/22/16 0644  WBC 4.7 4.0 4.6  HGB 11.8* 11.2* 11.9*  HCT 34.3* 32.1* 34.0*  PLT 234 222 238   BMET  Recent Labs  06/20/16 1020 06/21/16 0649 06/21/16 0951 06/22/16 0644  NA 140 140 138 136  K 3.9 4.2 4.5 3.6  CL 110 111 108 108  CO2 CREATININE 0.66 0.63 0.73 0.77   LFT  Recent Labs  06/21/16 0649 06/21/16 0951 06/22/16 0644  PROT 6.2* 7.1 6.6  AST 80* 94* 50*  ALT 312* 349* 256*  ALKPHOS 128* 140* 118  BILITOT 0.6 0.5 0.6   PT/INR  Recent Labs  06/20/16 0759 06/21/16 0649  LABPROT 13.7 14.4  INR 1.04 1.11   PANCREAS  Recent Labs  06/20/16 1020 06/22/16 0644  LIPASE 15 17         Studies/Results: Dg Cholangiogram Operative  Result Date: 06/22/2016 CLINICAL DATA:  46 year old female with a history of cholelithiasis EXAM: INTRAOPERATIVE CHOLANGIOGRAM TECHNIQUE: Cholangiographic images from the C-arm fluoroscopic device were submitted for interpretation post-operatively. Please see the procedural report for the amount of contrast and the fluoroscopy time utilized. COMPARISON:  ERCP 06/21/2016 FINDINGS: Surgical instruments project over the upper abdomen. There is cannulation of the cystic duct/gallbladder neck, with  antegrade infusion of contrast. Caliber of the extrahepatic ductal system within normal limits. No large filling defect identified. Free flow of contrast across the ampulla. IMPRESSION: Intraoperative cholangiogram demonstrates extrahepatic biliary ducts of unremarkable caliber, with no large filling defect identified. Free flow of contrast across the ampulla. Please refer to the dictated operative report for full details of intraoperative findings and procedure Electronically Signed   By: Gilmer Mor D.O.   On: 06/22/2016 09:07   Dg Ercp Biliary & Pancreatic Ducts  Result Date: 06/21/2016 CLINICAL DATA:  Choledocholithiasis by MRCP EXAM: INTRAOPERATIVE CHOLANGIOGRAM TECHNIQUE: Cholangiographic images from the C-arm fluoroscopic device were submitted for interpretation post-operatively. Please see the procedural report for the amount of contrast and the fluoroscopy time utilized. COMPARISON:  06/20/2016 FINDINGS: Limited spot fluoroscopic imaging during the ERCP procedure. Retrograde cholangiogram performed. No significant biliary dilatation, stricture or filling defect. Balloon sweep performed. Contrast refluxes into the cystic duct and gallbladder. IMPRESSION: Biliary system appears patent. Electronically Signed   By: Judie Petit.  Shick M.D.   On: 06/21/2016 09:03    Medications: I have reviewed the patient's current medications.  Assessment/Plan: 1. GSs, CBD stones. S/P lap chole and ERCP. LFTs improving lipase normal. We will be available if needed. Told pt to follow-up with Dr Matthias Hughs as needed.   Skylene Deremer JR,Gabrian Hoque L 06/23/2016, 7:06 AM  This note was created using voice recognition software. Minor errors may Have occurred unintentionally.  Pager: [phone] If no answer or after hours call [phone]

## 2016-06-23 NOTE — Discharge Summary (Signed)
Physician Discharge Summary  Adriana Fernandez:[id number] DOB: [birth year] DOA: 06/19/2016  PCP: Adriana Plum, MD  Admit date: 06/19/2016 Discharge date: 06/23/2016  Admitted From: Home Disposition:  Home   Recommendations for Outpatient Follow-up:  1. Follow up with PCP in 1-2 weeks 2. Please obtain CMP in one week    Discharge Condition: Stable CODE STATUS: FULL Diet recommendation: Heart Healthy    Brief/Interim Summary: 46 year old female with a history of hypertension, HIV, paranoid schizophrenia, sickle cell trait presented with one-day history of abdominal pain that began on afternoon of April 25, 2018with associated nausea and vomiting. The patient visited the emergency department more 06/13/2016 with same symptoms. She was discharged home in stable condition. She returned to the ED again yesterday 06/19/16 with abdominal pain after eating tacos with ground [place] and tomatoes.She has a history of gallstones was referred to surgery but is having difficulty getting into the office for evaluation. In the emergency department, the patient was afebrile and he mechanically stable but hasincreasing liver enzymes with AST 221, ALT 404, alkaline phosphatase 126, total bilirubin 0.8, lipase 60. Gastroenterology and general surgery were consulted to assist with management. There was some concern of choledocholithiasis because of elevated bilirubin up to 1.4 at the time of admission. Abdominal ultrasound showed cholelithiasis with moderate gallbladder distention and trace pericholecystic fluid concerning for cholecystitis.  Discharge Diagnoses:  Symptomatic cholelithiasis/Chronic Cholecystitis -Initially Concerned about acute cholecystitis -Continued Zosyn through 4/28 -06/19/2016 abdominal ultrasound--cholelithiasis with GBdistention and trace pericholecystic fluid concerning for acute cholecystitis -Appreciate GI consult-->MRCP to rule out choledocholithiasis -06/20/16  MRCP-->solitary 3 mm choledocholithiasis at the junction of CBDand amp along with mild dilated common bile duct; diffuse central intrahepatic biliary ductal dilatation; cholelithiasis with mild diffuse gallbladder wall thickening -06/21/16 ERCP (Dr. Radene Fernandez bile duct stone. dilated biliary tree up to 8mm; sphincterotomy -ContinueIV fluids -Appreciate general surgery followup -06/22/16--lap chole, neg IOC -advance diet per surgery-->pt tolerated diet  HIV -likely chronic non-progressor -04/11/2016--CD4--800/44%; HIV on an undetectable  Hypertension -Holding amlodipine and HCTZ secondary to soft blood pressure-->BP remains stable -will not restart at time of d/c  -follow up with PCP for BP check  Paranoid schizophrenia -Continue InVega  Depression -Continue Wellbutrin   Discharge Instructions  Discharge Instructions    Diet - low sodium heart healthy    Complete by:  As directed    Increase activity slowly    Complete by:  As directed      Allergies as of 06/23/2016      Reactions   Dilaudid [hydromorphone Hcl] Itching   Latex       Medication List    STOP taking these medications   amLODipine 5 MG tablet Commonly known as:  NORVASC   hydrochlorothiazide 25 MG tablet Commonly known as:  HYDRODIURIL     TAKE these medications   buPROPion 150 MG 12 hr tablet Commonly known as:  WELLBUTRIN SR Take 1 tablet (150 mg total) by mouth 2 (two) times daily.   dicyclomine 20 MG tablet Commonly known as:  BENTYL Take 1 tablet (20 mg total) by mouth 2 (two) times daily.   HYDROcodone-acetaminophen 5-325 MG tablet Commonly known as:  NORCO/VICODIN Take 1-2 tablets by mouth every 4 (four) hours as needed for moderate pain.   ibuprofen 600 MG tablet Commonly known as:  ADVIL,MOTRIN Take 1 tablet (600 mg total) by mouth every 6 (six) hours as needed. What changed:  reasons to take this   ondansetron 8 MG disintegrating tablet Commonly known as:  ZOFRAN  ODT 8mg  ODT q8 hours prn nausea What changed:  how much to take  how to take this  when to take this  reasons to take this  additional instructions   paliperidone 3 MG 24 hr tablet Commonly known as:  INVEGA Take 3 mg by mouth daily.   traMADol 50 MG tablet Commonly known as:  ULTRAM Take 1 tablet (50 mg total) by mouth every 6 (six) hours as needed. What changed:  reasons to take this   valACYclovir 1000 MG tablet Commonly known as:  VALTREX Take 1,000 mg by mouth daily.      Follow-up Information    [facility], [place]. Schedule an appointment as soon as possible for a visit on 07/16/2016.   Specialty:  General Surgery Why:  Your appointment is 07/16/2016 at 10:45AM. Please arrive 30 minutes prior to your appointment to check in and fill out necessary  Contact information: [address] [place] [address] [phone]         Allergies  Allergen Reactions  . Dilaudid [Hydromorphone Hcl] Itching  . Latex     Consultations:  General surgery  [facility]  [facility] for ERCP   Procedures/Studies: Dg Cholangiogram Operative  Result Date: 06/22/2016 CLINICAL DATA:  46 year old female with a history of cholelithiasis EXAM: INTRAOPERATIVE CHOLANGIOGRAM TECHNIQUE: Cholangiographic images from the C-arm fluoroscopic device were submitted for interpretation post-operatively. Please see the procedural report for the amount of contrast and the fluoroscopy time utilized. COMPARISON:  ERCP 06/21/2016 FINDINGS: Surgical instruments project over the upper abdomen. There is cannulation of the cystic duct/gallbladder neck, with antegrade infusion of contrast. Caliber of the extrahepatic ductal system within normal limits. No large filling defect identified. Free flow of contrast across the ampulla. IMPRESSION: Intraoperative cholangiogram demonstrates extrahepatic biliary ducts of unremarkable caliber, with no large filling defect  identified. Free flow of contrast across the ampulla. Please refer to the dictated operative report for full details of intraoperative findings and procedure Electronically Signed   By: Gilmer Mor D.O.   On: 06/22/2016 09:07   Mr 3d Recon At Scanner  Result Date: 06/20/2016 CLINICAL DATA:  Inpatient. Abdominal pain, nausea and vomiting. Cholelithiasis with concern for acute cholecystitis at sonography. EXAM: MRI ABDOMEN WITHOUT AND WITH CONTRAST (INCLUDING MRCP) TECHNIQUE: Multiplanar multisequence MR imaging of the abdomen was performed both before and after the administration of intravenous contrast. Heavily T2-weighted images of the biliary and pancreatic ducts were obtained, and three-dimensional MRCP images were rendered by post processing. CONTRAST:  14mL MULTIHANCE GADOBENATE DIMEGLUMINE 529 MG/ML IV SOLN COMPARISON:  06/19/2016 right upper quadrant abdominal sonogram. FINDINGS: Lower chest: Small pericardial effusion/ thickening. Trace dependent bilateral pleural effusions. Hepatobiliary: Normal liver size and configuration. Minimal diffuse hepatic steatosis. No liver mass. Mildly distended gallbladder. Numerous subcentimeter gallstones layering in the gallbladder. Mild diffuse gallbladder wall thickening. No pericholecystic fluid. Mild diffuse central intrahepatic biliary ductal dilatation. Common bile duct diameter 7 mm, mildly dilated. There is a solitary round 3 mm filling defect at the junction of the common bile duct and ampulla (series 4/ image 39), compatible with a small stone. No enhancing biliary or ampullary mass. Pancreas: No pancreatic mass or duct dilation.  No pancreas divisum. Spleen: Normal size spleen. There are several (at least 5) small T2 hyperintense lesions scattered throughout the spleen, largest 1.3 cm, a few of which demonstrate thin internal septations, with no solid enhancement, compatible with benign splenic lymphangiomas. Adrenals/Urinary Tract: Normal adrenals. No  hydronephrosis. There is a  1.1 x 1.0 cm renal cortical lesion in the upper right kidney (series 1303/ image 54) with thin internal septation and no solid enhancement, compatible with a Bosniak category 2 renal cyst. Additional subcentimeter simple renal cysts in both kidneys. No suspicious renal masses. Stomach/Bowel: Grossly normal stomach. Visualized small and large bowel is normal caliber, with no bowel wall thickening. Vascular/Lymphatic: Normal caliber abdominal aorta. Patent portal, splenic, hepatic and renal veins. No pathologically enlarged lymph nodes in the abdomen. Other: No abdominal ascites or focal fluid collection. Musculoskeletal: No aggressive appearing focal osseous lesions. IMPRESSION: 1. Suggestion of a solitary 3 mm choledocholith at the junction of the common bile duct and ampulla, with mildly dilated common bile duct (7 mm diameter) and mild diffuse central intrahepatic biliary ductal dilatation . 2. Cholelithiasis. Mildly distended gallbladder. Mild diffuse gallbladder wall thickening. Acute cholecystitis cannot be excluded. 3. Trace dependent bilateral pleural effusions. Small pericardial effusion/thickening. 4. Minimal diffuse hepatic steatosis. 5. Small Bosniak category 1 and category 2 renal cysts. No suspicious renal masses. 6. Small splenic lymphangiomas. Electronically Signed   By: Delbert Phenix M.D.   On: 06/20/2016 11:49   Dg Ercp Biliary & Pancreatic Ducts  Result Date: 06/21/2016 CLINICAL DATA:  Choledocholithiasis by MRCP EXAM: INTRAOPERATIVE CHOLANGIOGRAM TECHNIQUE: Cholangiographic images from the C-arm fluoroscopic device were submitted for interpretation post-operatively. Please see the procedural report for the amount of contrast and the fluoroscopy time utilized. COMPARISON:  06/20/2016 FINDINGS: Limited spot fluoroscopic imaging during the ERCP procedure. Retrograde cholangiogram performed. No significant biliary dilatation, stricture or filling defect. Balloon sweep  performed. Contrast refluxes into the cystic duct and gallbladder. IMPRESSION: Biliary system appears patent. Electronically Signed   By: Judie Petit.  Shick M.D.   On: 06/21/2016 09:03   Dg Abdomen Acute W/chest  Result Date: 06/13/2016 CLINICAL DATA:  Sudden onset of lower abdominal pain.  Vomiting. EXAM: DG ABDOMEN ACUTE W/ 1V CHEST COMPARISON:  Radiographs 05/04/2016 FINDINGS: The cardiomediastinal contours are normal. The lungs are clear. There is no free intra-abdominal air. No dilated bowel loops to suggest obstruction. Small-moderate volume of stool throughout the colon. No radiopaque calculi. Right pelvic phleboliths again seen. IUD in the pelvis. No acute osseous abnormalities are seen. IMPRESSION: 1. Normal bowel gas pattern. No evidence of bowel obstruction or free air. 2. Clear lungs. Electronically Signed   By: Rubye Oaks M.D.   On: 06/13/2016 03:10   Mr Abdomen Mrcp Vivien Rossetti Contast  Result Date: 06/20/2016 CLINICAL DATA:  Inpatient. Abdominal pain, nausea and vomiting. Cholelithiasis with concern for acute cholecystitis at sonography. EXAM: MRI ABDOMEN WITHOUT AND WITH CONTRAST (INCLUDING MRCP) TECHNIQUE: Multiplanar multisequence MR imaging of the abdomen was performed both before and after the administration of intravenous contrast. Heavily T2-weighted images of the biliary and pancreatic ducts were obtained, and three-dimensional MRCP images were rendered by post processing. CONTRAST:  14mL MULTIHANCE GADOBENATE DIMEGLUMINE 529 MG/ML IV SOLN COMPARISON:  06/19/2016 right upper quadrant abdominal sonogram. FINDINGS: Lower chest: Small pericardial effusion/ thickening. Trace dependent bilateral pleural effusions. Hepatobiliary: Normal liver size and configuration. Minimal diffuse hepatic steatosis. No liver mass. Mildly distended gallbladder. Numerous subcentimeter gallstones layering in the gallbladder. Mild diffuse gallbladder wall thickening. No pericholecystic fluid. Mild diffuse central  intrahepatic biliary ductal dilatation. Common bile duct diameter 7 mm, mildly dilated. There is a solitary round 3 mm filling defect at the junction of the common bile duct and ampulla (series 4/ image 39), compatible with a small stone. No enhancing biliary or ampullary mass. Pancreas: No pancreatic mass  or duct dilation.  No pancreas divisum. Spleen: Normal size spleen. There are several (at least 5) small T2 hyperintense lesions scattered throughout the spleen, largest 1.3 cm, a few of which demonstrate thin internal septations, with no solid enhancement, compatible with benign splenic lymphangiomas. Adrenals/Urinary Tract: Normal adrenals. No hydronephrosis. There is a 1.1 x 1.0 cm renal cortical lesion in the upper right kidney (series 1303/ image 54) with thin internal septation and no solid enhancement, compatible with a Bosniak category 2 renal cyst. Additional subcentimeter simple renal cysts in both kidneys. No suspicious renal masses. Stomach/Bowel: Grossly normal stomach. Visualized small and large bowel is normal caliber, with no bowel wall thickening. Vascular/Lymphatic: Normal caliber abdominal aorta. Patent portal, splenic, hepatic and renal veins. No pathologically enlarged lymph nodes in the abdomen. Other: No abdominal ascites or focal fluid collection. Musculoskeletal: No aggressive appearing focal osseous lesions. IMPRESSION: 1. Suggestion of a solitary 3 mm choledocholith at the junction of the common bile duct and ampulla, with mildly dilated common bile duct (7 mm diameter) and mild diffuse central intrahepatic biliary ductal dilatation . 2. Cholelithiasis. Mildly distended gallbladder. Mild diffuse gallbladder wall thickening. Acute cholecystitis cannot be excluded. 3. Trace dependent bilateral pleural effusions. Small pericardial effusion/thickening. 4. Minimal diffuse hepatic steatosis. 5. Small Bosniak category 1 and category 2 renal cysts. No suspicious renal masses. 6. Small splenic  lymphangiomas. Electronically Signed   By: Delbert Phenix M.D.   On: 06/20/2016 11:49   US Abdomen Limited Ruq  Result Date: 06/19/2016 CLINICAL DATA:  Intermittent right upper quadrant pain times several months, latest episode x5 hours. EXAM: US ABDOMEN LIMITED - RIGHT UPPER QUADRANT COMPARISON:  None. FINDINGS: Gallbladder: Numerous layering gallstones are seen within the gallbladder and at the neck of the gallbladder as well the largest measuring 1.1 cm. Gallbladder wall is top normal at 2.5 mm in thickness. Trace pericholecystic edema and fluid. No sonographic Murphy's however the patient was reportedly on pain medication. Common bile duct: Diameter: 7 mm.  No choledocholithiasis. Liver: No focal lesion identified. Within normal limits in parenchymal echogenicity. IMPRESSION: Numerous gallstones are seen within a moderately distended appearing gallbladder with trace pericholecystic fluid. Findings raise concern for acute cholecystitis. Electronically Signed   By: Tollie Eth M.D.   On: 06/19/2016 22:26         Discharge Exam: Vitals:   06/22/16 2157 06/23/16 0531  BP: (!) 94/48 (!) 109/58  Pulse: 74 65  Resp: 18 18  Temp: 99.1 F (37.3 C) 98.8 F (37.1 C)   Vitals:   06/22/16 1104 06/22/16 1433 06/22/16 2157 06/23/16 0531  BP: 123/82 122/66 (!) 94/48 (!) 109/58  Pulse: (!) 109 77 74 65  Resp: Temp: 97.6 F (36.4 C) 99 F (37.2 C) 99.1 F (37.3 C) 98.8 F (37.1 C)  TempSrc: Oral Oral Oral Oral  SpO2: 99% 99% 96% 96%  Weight:      Height:        General: Pt is alert, awake, not in acute distress Cardiovascular: RRR, S1/S2 +, no rubs, no gallops Respiratory: CTA bilaterally, no wheezing, no rhonchi Abdominal: Soft, mild incisional pain, ND, bowel sounds + Extremities: no edema, no cyanosis   The results of significant diagnostics from this hospitalization (including imaging, microbiology, ancillary and laboratory) are listed below for reference.     Significant Diagnostic Studies: Dg Cholangiogram Operative  Result Date: 06/22/2016 CLINICAL DATA:  46 year old female with a history of cholelithiasis EXAM: INTRAOPERATIVE CHOLANGIOGRAM TECHNIQUE: Cholangiographic images from  the C-arm fluoroscopic device were submitted for interpretation post-operatively. Please see the procedural report for the amount of contrast and the fluoroscopy time utilized. COMPARISON:  ERCP 06/21/2016 FINDINGS: Surgical instruments project over the upper abdomen. There is cannulation of the cystic duct/gallbladder neck, with antegrade infusion of contrast. Caliber of the extrahepatic ductal system within normal limits. No large filling defect identified. Free flow of contrast across the ampulla. IMPRESSION: Intraoperative cholangiogram demonstrates extrahepatic biliary ducts of unremarkable caliber, with no large filling defect identified. Free flow of contrast across the ampulla. Please refer to the dictated operative report for full details of intraoperative findings and procedure Electronically Signed   By: Gilmer Mor D.O.   On: 06/22/2016 09:07   Mr 3d Recon At Scanner  Result Date: 06/20/2016 CLINICAL DATA:  Inpatient. Abdominal pain, nausea and vomiting. Cholelithiasis with concern for acute cholecystitis at sonography. EXAM: MRI ABDOMEN WITHOUT AND WITH CONTRAST (INCLUDING MRCP) TECHNIQUE: Multiplanar multisequence MR imaging of the abdomen was performed both before and after the administration of intravenous contrast. Heavily T2-weighted images of the biliary and pancreatic ducts were obtained, and three-dimensional MRCP images were rendered by post processing. CONTRAST:  14mL MULTIHANCE GADOBENATE DIMEGLUMINE 529 MG/ML IV SOLN COMPARISON:  06/19/2016 right upper quadrant abdominal sonogram. FINDINGS: Lower chest: Small pericardial effusion/ thickening. Trace dependent bilateral pleural effusions. Hepatobiliary: Normal liver size and configuration. Minimal diffuse  hepatic steatosis. No liver mass. Mildly distended gallbladder. Numerous subcentimeter gallstones layering in the gallbladder. Mild diffuse gallbladder wall thickening. No pericholecystic fluid. Mild diffuse central intrahepatic biliary ductal dilatation. Common bile duct diameter 7 mm, mildly dilated. There is a solitary round 3 mm filling defect at the junction of the common bile duct and ampulla (series 4/ image 39), compatible with a small stone. No enhancing biliary or ampullary mass. Pancreas: No pancreatic mass or duct dilation.  No pancreas divisum. Spleen: Normal size spleen. There are several (at least 5) small T2 hyperintense lesions scattered throughout the spleen, largest 1.3 cm, a few of which demonstrate thin internal septations, with no solid enhancement, compatible with benign splenic lymphangiomas. Adrenals/Urinary Tract: Normal adrenals. No hydronephrosis. There is a 1.1 x 1.0 cm renal cortical lesion in the upper right kidney (series 1303/ image 54) with thin internal septation and no solid enhancement, compatible with a Bosniak category 2 renal cyst. Additional subcentimeter simple renal cysts in both kidneys. No suspicious renal masses. Stomach/Bowel: Grossly normal stomach. Visualized small and large bowel is normal caliber, with no bowel wall thickening. Vascular/Lymphatic: Normal caliber abdominal aorta. Patent portal, splenic, hepatic and renal veins. No pathologically enlarged lymph nodes in the abdomen. Other: No abdominal ascites or focal fluid collection. Musculoskeletal: No aggressive appearing focal osseous lesions. IMPRESSION: 1. Suggestion of a solitary 3 mm choledocholith at the junction of the common bile duct and ampulla, with mildly dilated common bile duct (7 mm diameter) and mild diffuse central intrahepatic biliary ductal dilatation . 2. Cholelithiasis. Mildly distended gallbladder. Mild diffuse gallbladder wall thickening. Acute cholecystitis cannot be excluded. 3. Trace  dependent bilateral pleural effusions. Small pericardial effusion/thickening. 4. Minimal diffuse hepatic steatosis. 5. Small Bosniak category 1 and category 2 renal cysts. No suspicious renal masses. 6. Small splenic lymphangiomas. Electronically Signed   By: Delbert Phenix M.D.   On: 06/20/2016 11:49   Dg Ercp Biliary & Pancreatic Ducts  Result Date: 06/21/2016 CLINICAL DATA:  Choledocholithiasis by MRCP EXAM: INTRAOPERATIVE CHOLANGIOGRAM TECHNIQUE: Cholangiographic images from the C-arm fluoroscopic device were submitted for interpretation post-operatively. Please see the procedural  report for the amount of contrast and the fluoroscopy time utilized. COMPARISON:  06/20/2016 FINDINGS: Limited spot fluoroscopic imaging during the ERCP procedure. Retrograde cholangiogram performed. No significant biliary dilatation, stricture or filling defect. Balloon sweep performed. Contrast refluxes into the cystic duct and gallbladder. IMPRESSION: Biliary system appears patent. Electronically Signed   By: Judie Petit.  Shick M.D.   On: 06/21/2016 09:03   Dg Abdomen Acute W/chest  Result Date: 06/13/2016 CLINICAL DATA:  Sudden onset of lower abdominal pain.  Vomiting. EXAM: DG ABDOMEN ACUTE W/ 1V CHEST COMPARISON:  Radiographs 05/04/2016 FINDINGS: The cardiomediastinal contours are normal. The lungs are clear. There is no free intra-abdominal air. No dilated bowel loops to suggest obstruction. Small-moderate volume of stool throughout the colon. No radiopaque calculi. Right pelvic phleboliths again seen. IUD in the pelvis. No acute osseous abnormalities are seen. IMPRESSION: 1. Normal bowel gas pattern. No evidence of bowel obstruction or free air. 2. Clear lungs. Electronically Signed   By: Rubye Oaks M.D.   On: 06/13/2016 03:10   Mr Abdomen Mrcp Vivien Rossetti Contast  Result Date: 06/20/2016 CLINICAL DATA:  Inpatient. Abdominal pain, nausea and vomiting. Cholelithiasis with concern for acute cholecystitis at sonography. EXAM: MRI  ABDOMEN WITHOUT AND WITH CONTRAST (INCLUDING MRCP) TECHNIQUE: Multiplanar multisequence MR imaging of the abdomen was performed both before and after the administration of intravenous contrast. Heavily T2-weighted images of the biliary and pancreatic ducts were obtained, and three-dimensional MRCP images were rendered by post processing. CONTRAST:  14mL MULTIHANCE GADOBENATE DIMEGLUMINE 529 MG/ML IV SOLN COMPARISON:  06/19/2016 right upper quadrant abdominal sonogram. FINDINGS: Lower chest: Small pericardial effusion/ thickening. Trace dependent bilateral pleural effusions. Hepatobiliary: Normal liver size and configuration. Minimal diffuse hepatic steatosis. No liver mass. Mildly distended gallbladder. Numerous subcentimeter gallstones layering in the gallbladder. Mild diffuse gallbladder wall thickening. No pericholecystic fluid. Mild diffuse central intrahepatic biliary ductal dilatation. Common bile duct diameter 7 mm, mildly dilated. There is a solitary round 3 mm filling defect at the junction of the common bile duct and ampulla (series 4/ image 39), compatible with a small stone. No enhancing biliary or ampullary mass. Pancreas: No pancreatic mass or duct dilation.  No pancreas divisum. Spleen: Normal size spleen. There are several (at least 5) small T2 hyperintense lesions scattered throughout the spleen, largest 1.3 cm, a few of which demonstrate thin internal septations, with no solid enhancement, compatible with benign splenic lymphangiomas. Adrenals/Urinary Tract: Normal adrenals. No hydronephrosis. There is a 1.1 x 1.0 cm renal cortical lesion in the upper right kidney (series 1303/ image 54) with thin internal septation and no solid enhancement, compatible with a Bosniak category 2 renal cyst. Additional subcentimeter simple renal cysts in both kidneys. No suspicious renal masses. Stomach/Bowel: Grossly normal stomach. Visualized small and large bowel is normal caliber, with no bowel wall thickening.  Vascular/Lymphatic: Normal caliber abdominal aorta. Patent portal, splenic, hepatic and renal veins. No pathologically enlarged lymph nodes in the abdomen. Other: No abdominal ascites or focal fluid collection. Musculoskeletal: No aggressive appearing focal osseous lesions. IMPRESSION: 1. Suggestion of a solitary 3 mm choledocholith at the junction of the common bile duct and ampulla, with mildly dilated common bile duct (7 mm diameter) and mild diffuse central intrahepatic biliary ductal dilatation . 2. Cholelithiasis. Mildly distended gallbladder. Mild diffuse gallbladder wall thickening. Acute cholecystitis cannot be excluded. 3. Trace dependent bilateral pleural effusions. Small pericardial effusion/thickening. 4. Minimal diffuse hepatic steatosis. 5. Small Bosniak category 1 and category 2 renal cysts. No suspicious renal masses. 6. Small  splenic lymphangiomas. Electronically Signed   By: Delbert Phenix M.D.   On: 06/20/2016 11:49   US Abdomen Limited Ruq  Result Date: 06/19/2016 CLINICAL DATA:  Intermittent right upper quadrant pain times several months, latest episode x5 hours. EXAM: US ABDOMEN LIMITED - RIGHT UPPER QUADRANT COMPARISON:  None. FINDINGS: Gallbladder: Numerous layering gallstones are seen within the gallbladder and at the neck of the gallbladder as well the largest measuring 1.1 cm. Gallbladder wall is top normal at 2.5 mm in thickness. Trace pericholecystic edema and fluid. No sonographic Murphy's however the patient was reportedly on pain medication. Common bile duct: Diameter: 7 mm.  No choledocholithiasis. Liver: No focal lesion identified. Within normal limits in parenchymal echogenicity. IMPRESSION: Numerous gallstones are seen within a moderately distended appearing gallbladder with trace pericholecystic fluid. Findings raise concern for acute cholecystitis. Electronically Signed   By: Tollie Eth M.D.   On: 06/19/2016 22:26     Microbiology: Recent Results (from the past 240  hour(s))  Surgical PCR screen     Status: None   Collection Time: 06/21/16 10:29 PM  Result Value Ref Range Status   MRSA, PCR NEGATIVE NEGATIVE Final   Staphylococcus aureus NEGATIVE NEGATIVE Final    Comment:        The Xpert SA Assay (FDA approved for NASAL specimens in patients over 32 years of age), is one component of a comprehensive surveillance program.  Test performance has been validated by [facility] for patients greater than or equal to 32 year old. It is not intended to diagnose infection nor to guide or monitor treatment.      Labs: Basic Metabolic Panel:  Recent Labs Lab 06/20/16 1020 06/21/16 0649 06/21/16 0951 06/22/16 0644 06/23/16 0657  NA 140 140 138 136 137  K 3.9 4.2 4.5 3.6 3.7  CL 110 111 108 108 108  CO2 24 23 22 22 23   GLUCOSE 89 86 119* 98 130*  BUN 6 <5* 5* 5* 5*  CREATININE 0.66 0.63 0.73 0.77 0.75  CALCIUM 8.5* 8.8* 9.0 8.6* 8.7*   Liver Function Tests:  Recent Labs Lab 06/20/16 1020 06/21/16 0649 06/21/16 0951 06/22/16 0644 06/23/16 0657  AST 188* 80* 94* 50* 52*  ALT 470* 312* 349* 256* 191*  ALKPHOS 147* 128* 140* 118 110  BILITOT 0.6 0.6 0.5 0.6 0.5  PROT 6.9 6.2* 7.1 6.6 5.9*  ALBUMIN 3.5 3.2* 3.5 3.4* 3.2*    Recent Labs Lab 06/19/16 1923 06/20/16 1020 06/22/16 0644  LIPASE 60* 15 17  AMYLASE  --   --  63   No results for input(s): AMMONIA in the last 168 hours. CBC:  Recent Labs Lab 06/19/16 1923 06/20/16 1020 06/21/16 0649 06/22/16 0644 06/23/16 0657  WBC 6.5 4.7 4.0 4.6 4.9  NEUTROABS 4.5  --  1.8  --   --   HGB 12.9 11.8* 11.2* 11.9* 11.1*  HCT 37.1 34.3* 32.1* 34.0* 32.5*  MCV 84.3 86.0 84.7 84.2 85.1  PLT 254 234 222 238 224   Cardiac Enzymes: No results for input(s): CKTOTAL, CKMB, CKMBINDEX, TROPONINI in the last 168 hours. BNP: Invalid input(s): POCBNP CBG:  Recent Labs Lab 06/22/16 0725 06/23/16 0739  GLUCAP 93 120*    Time coordinating discharge:  Greater than 30  minutes  Signed:  Shalondra Wunschel, DO [facility] Pager: [phone] 06/23/2016, 10:32 AM

## 2016-06-24 ENCOUNTER — Encounter (HOSPITAL_COMMUNITY): Payer: Self-pay | Admitting: Surgery

## 2016-06-24 ENCOUNTER — Telehealth: Payer: Self-pay | Admitting: *Deleted

## 2016-06-24 LAB — BPAM RBC
Blood Product Expiration Date: 201805162359
Blood Product Expiration Date: 201805162359
Unit Type and Rh: 6200
Unit Type and Rh: 6200

## 2016-06-24 LAB — TYPE AND SCREEN
ABO/RH(D): A POS
Antibody Screen: NEGATIVE
Unit division: 0
Unit division: 0

## 2016-06-24 NOTE — Telephone Encounter (Signed)
Patient called and requested a refill on her Amlodipine 5 mg daily and HCTZ 25 mr daily. Advised the patient do not see them on her medication list and she advised she was on them before she went to the hospital. After review of her chart it was stopped at discharged so Dr Daiva Eves will have to review her chart and let me know. She advised she can not see her PCP due to no insurance at this time. Advised will let the doctor know and call her back if he feels it is ok to refill.

## 2016-06-24 NOTE — Telephone Encounter (Signed)
Does she have a PCP or are we the only PCP's. I would prefer that she at least have her BP checked prior to restarting them

## 2016-06-27 NOTE — Telephone Encounter (Signed)
Per Dr Daiva EvesVan Dam called the patient and advised need to see her PCP for a BP check prior to restarting the medications.

## 2016-09-30 ENCOUNTER — Ambulatory Visit: Payer: Self-pay

## 2016-10-04 ENCOUNTER — Encounter: Payer: Self-pay | Admitting: Infectious Disease

## 2016-12-16 ENCOUNTER — Encounter (HOSPITAL_BASED_OUTPATIENT_CLINIC_OR_DEPARTMENT_OTHER): Payer: Self-pay | Admitting: *Deleted

## 2016-12-16 ENCOUNTER — Emergency Department (HOSPITAL_BASED_OUTPATIENT_CLINIC_OR_DEPARTMENT_OTHER)
Admission: EM | Admit: 2016-12-16 | Discharge: 2016-12-16 | Disposition: A | Discharge status: Home / Self Care | Payer: Self-pay | Attending: Emergency Medicine | Admitting: Emergency Medicine

## 2016-12-16 DIAGNOSIS — Z91040 Latex allergy status: Secondary | ICD-10-CM | POA: Insufficient documentation

## 2016-12-16 DIAGNOSIS — R197 Diarrhea, unspecified: Secondary | ICD-10-CM | POA: Insufficient documentation

## 2016-12-16 DIAGNOSIS — Z79899 Other long term (current) drug therapy: Secondary | ICD-10-CM | POA: Insufficient documentation

## 2016-12-16 NOTE — ED Notes (Signed)
Pt. In no distress.

## 2016-12-16 NOTE — Discharge Instructions (Signed)
Continue taking the over the counter diarrhea medication, try applying a barrier cream to your anal area, such as dessitin, soak in the tub or rinse off with warm water after a BM,  return for fever, vomiting, worsening symptoms

## 2016-12-16 NOTE — ED Triage Notes (Signed)
Diarrhea for an hour. Rectal pain. States the wipes she was using may have been irritating.

## 2016-12-16 NOTE — ED Notes (Signed)
ED Provider at bedside. 

## 2016-12-16 NOTE — ED Provider Notes (Signed)
[facility] EMERGENCY DEPARTMENT Provider Note   CSN: [id number] Arrival date & time: 12/16/16  1440     History   Chief Complaint Chief Complaint  Patient presents with  . Diarrhea    HPI Adriana Fernandez is a 46 y.o. female.  HPI Presented to the emergency room for evaluation of diarrhea and rectal pain.  Patient states she had some spinach dip this morning.   Sometime after that she started to have diarrhea.  Patient had copious liquid stool.  Following that she started having anal irritation.  She denies any blood in her stool.  No abdominal pain.  No fevers.  Patient took some over-the-counter diarrhea medication.  Since taking that her diarrhea has decreased significantly.  She still has irritation in the perianal area.  No recent travel.  No recent antibiotics Past Medical History:  Diagnosis Date  . Depression   . Elevated LFTs   . Gallstones   . HIV disease (HCC)   . 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 Hypertension   . Paranoid schizophrenia (HCC)   . Psychosis (HCC)   . Schizophrenia (HCC)   . Sickle cell trait [facility](HCC)     Patient Active Problem List   Diagnosis Date Noted  . Biliary colic   . Hypokalemia 06/20/2016  . Cholelithiasis with acute cholecystitis 06/20/2016  . Choledocholithiasis with acute cholecystitis with obstruction 06/20/2016  . HSV infection 07/26/2014  . 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 Paranoid schizophrenia (HCC)   . Sickle cell trait (HCC)   . Schizophrenia (HCC) 12/09/2011  . Epistaxis 05/07/2011  . HYPERPROLACTINEMIA 05/10/2009  . NIPPLE DISCHARGE 05/05/2009  . Human immunodeficiency virus (HIV) disease (HCC) 12/27/2005  . Depression 12/27/2005    Past Surgical History:  Procedure Laterality Date  . CHOLECYSTECTOMY N/A 06/22/2016   Procedure: LAPAROSCOPIC CHOLECYSTECTOMY WITH INTRAOPERATIVE CHOLANGIOGRAM;  Surgeon: Ovidio Kinavid  Newman, MD;  Location: [facility];  Service: General;  Laterality: N/A;  . ERCP N/A 06/21/2016   Procedure: ENDOSCOPIC RETROGRADE CHOLANGIOPANCREATOGRAPHY (ERCP);  Surgeon: Iva Booparl E Gessner, MD;  [facility];  Service: Endoscopy;  Laterality: N/A;    OB History    No data available       Home Medications    Prior to Admission medications   Medication Sig Start Date End Date Taking? Authorizing Provider  buPROPion (WELLBUTRIN SR) 150 MG 12 hr tablet Take 1 tablet (150 mg total) by mouth 2 (two) times daily. 06/24/12   Randall HissVan Dam, Cornelius N, MD  dicyclomine (BENTYL) 20 MG tablet Take 1 tablet (20 mg total) by mouth 2 (two) times daily. 06/13/16   Palumbo, April, MD  HYDROcodone-acetaminophen (NORCO/VICODIN) 5-325 MG tablet Take 1-2 tablets by mouth every 4 (four) hours as needed for moderate pain. 06/23/16   Catarina Hartshornat, David, MD  ibuprofen (ADVIL,MOTRIN) 600 MG tablet Take 1 tablet (600 mg total) by mouth every 6 (six) hours as needed. Patient taking differently: Take 600 mg by mouth every 6 (six) hours as needed for headache.  05/04/16   Rolan BuccoBelfi, Melanie, MD  ondansetron (ZOFRAN ODT) 8 MG disintegrating tablet 8mg  ODT q8 hours prn nausea Patient taking differently: Take 8 mg by mouth every 8 (eight) hours as needed for nausea or vomiting. 8mg  ODT q8 hours prn nausea 06/13/16   Palumbo, April, MD  paliperidone (INVEGA) 3 MG 24 hr tablet Take 3 mg by mouth daily.     [provider]  traMADol (ULTRAM) 50 MG tablet Take 1 tablet (50 mg total) by mouth every 6 (six) hours as needed. Patient taking differently: Take 50 mg by mouth every 6 (six) hours as needed for moderate pain.  05/04/16   [name], MD  valACYclovir (VALTREX) 1000 MG tablet Take 1,000 mg by mouth daily.    [provider]    Family History Family History  Problem Relation Age of Onset  . GER disease Mother   . Hypertension Mother   . Sickle cell anemia Brother   . Diabetes Maternal Aunt   . Diabetes  Maternal Grandmother   . Breast cancer Maternal Aunt   . Throat cancer Paternal Grandmother     Social History Social History  Substance Use Topics  . Smoking status: Never Smoker  . Smokeless tobacco: Never Used  . Alcohol use No     Allergies   Dilaudid [hydromorphone hcl] and Latex   Review of Systems Review of Systems  All other systems reviewed and are negative.    Physical Exam Updated Vital Signs BP 104/74   Pulse 98   Temp 98.3 F (36.8 C) (Oral)   Resp 16   Ht 1.702 m (5\' 7" )   Wt 77.1 kg (170 lb)   SpO2 99%   BMI 26.63 kg/m   Physical Exam  Constitutional: She appears well-developed and well-nourished. No distress.  HENT:  Head: Normocephalic and atraumatic.  Right Ear: External ear normal.  Left Ear: External ear normal.  Eyes: Conjunctivae are normal. Right eye exhibits no discharge. Left eye exhibits no discharge. No scleral icterus.  Neck: Neck supple. No tracheal deviation present.  Cardiovascular: Normal rate, regular rhythm and intact distal pulses.   Pulmonary/Chest: Effort normal and breath sounds normal. No stridor. No respiratory distress. She has no wheezes. She has no rales.  Abdominal: Soft. Bowel sounds are normal. She exhibits no distension. There is no tenderness. There is no rebound and no guarding.  Genitourinary:  Genitourinary Comments: No perianal rash, hemorrhoids or skin breakdown  Musculoskeletal: She exhibits no edema or tenderness.  Neurological: She is alert. She has normal strength. No cranial nerve deficit (no facial droop, extraocular movements intact, no slurred speech) or sensory deficit. She exhibits normal muscle tone. She displays no seizure activity. Coordination normal.  Skin: Skin is warm and dry. No rash noted.  Psychiatric: She has a normal mood and affect.  Nursing note and vitals reviewed.    ED Treatments / Results    Procedures Procedures (including critical care time)  Medications Ordered in  ED Medications - No data to display   Initial Impression / Assessment and Plan / ED Course  I have reviewed the triage vital signs and the nursing notes.  Pertinent labs & imaging results that were available during my care of the patient were reviewed by me and considered in my medical decision making (see chart for details).    It is  possible her symptoms may be related to the food she ate.  It is also possible this could be a viral illness.  She does not have any abdominal tenderness, I doubt colitis or other significant illness.  We discussed continuing the antidiarrhea medication.  She can try using a barrier cream such as Desitin.  Final Clinical Impressions(s) / ED Diagnoses   Final diagnoses:  Diarrhea of presumed infectious origin    New Prescriptions New Prescriptions   No medications on file     Linwood Dibbles, MD 12/16/16 1718

## 2017-01-08 ENCOUNTER — Ambulatory Visit (INDEPENDENT_AMBULATORY_CARE_PROVIDER_SITE_OTHER): Payer: Self-pay | Admitting: *Deleted

## 2017-01-08 DIAGNOSIS — Z23 Encounter for immunization: Secondary | ICD-10-CM

## 2017-02-24 ENCOUNTER — Other Ambulatory Visit: Payer: Self-pay

## 2017-02-24 ENCOUNTER — Encounter (HOSPITAL_BASED_OUTPATIENT_CLINIC_OR_DEPARTMENT_OTHER): Payer: Self-pay | Admitting: *Deleted

## 2017-02-24 ENCOUNTER — Emergency Department (HOSPITAL_BASED_OUTPATIENT_CLINIC_OR_DEPARTMENT_OTHER)
Admission: EM | Admit: 2017-02-24 | Discharge: 2017-02-24 | Disposition: A | Discharge status: Home / Self Care | Payer: No Typology Code available for payment source | Attending: Physician Assistant | Admitting: Physician Assistant

## 2017-02-24 DIAGNOSIS — I10 Essential (primary) hypertension: Secondary | ICD-10-CM | POA: Diagnosis not present

## 2017-02-24 DIAGNOSIS — Z91040 Latex allergy status: Secondary | ICD-10-CM | POA: Diagnosis not present

## 2017-02-24 DIAGNOSIS — Y999 Unspecified external cause status: Secondary | ICD-10-CM | POA: Insufficient documentation

## 2017-02-24 DIAGNOSIS — Z79899 Other long term (current) drug therapy: Secondary | ICD-10-CM | POA: Diagnosis not present

## 2017-02-24 DIAGNOSIS — Y92410 Unspecified street and highway as the place of occurrence of the external cause: Secondary | ICD-10-CM | POA: Insufficient documentation

## 2017-02-24 DIAGNOSIS — M25512 Pain in left shoulder: Secondary | ICD-10-CM | POA: Diagnosis present

## 2017-02-24 DIAGNOSIS — B20 Human immunodeficiency virus [HIV] disease: Secondary | ICD-10-CM | POA: Insufficient documentation

## 2017-02-24 DIAGNOSIS — M25511 Pain in right shoulder: Secondary | ICD-10-CM | POA: Insufficient documentation

## 2017-02-24 DIAGNOSIS — Y9389 Activity, other specified: Secondary | ICD-10-CM | POA: Diagnosis not present

## 2017-02-24 MED ORDER — CYCLOBENZAPRINE HCL 10 MG PO TABS: 10.0000 mg | ORAL_TABLET | Freq: Two times a day (BID) | ORAL | 0 refills | Status: DC | PRN
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
## 2017-02-24 NOTE — Discharge Instructions (Signed)
Please use the medications to help with your symptoms.
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
## 2017-04-01 DIAGNOSIS — F331 Major depressive disorder, recurrent, moderate: Secondary | ICD-10-CM | POA: Diagnosis not present

## 2017-04-10 DIAGNOSIS — Z131 Encounter for screening for diabetes mellitus: Secondary | ICD-10-CM | POA: Diagnosis not present

## 2017-04-10 DIAGNOSIS — Z0100 Encounter for examination of eyes and vision without abnormal findings: Secondary | ICD-10-CM | POA: Diagnosis not present

## 2017-04-10 DIAGNOSIS — E559 Vitamin D deficiency, unspecified: Secondary | ICD-10-CM | POA: Diagnosis not present

## 2017-04-10 DIAGNOSIS — I10 Essential (primary) hypertension: Secondary | ICD-10-CM | POA: Diagnosis not present

## 2017-04-10 DIAGNOSIS — E669 Obesity, unspecified: Secondary | ICD-10-CM | POA: Diagnosis not present

## 2017-04-10 DIAGNOSIS — Z0110 Encounter for examination of ears and hearing without abnormal findings: Secondary | ICD-10-CM | POA: Diagnosis not present

## 2017-04-10 DIAGNOSIS — R7303 Prediabetes: Secondary | ICD-10-CM | POA: Diagnosis not present

## 2017-04-10 DIAGNOSIS — B20 Human immunodeficiency virus [HIV] disease: Secondary | ICD-10-CM | POA: Diagnosis not present

## 2017-04-10 DIAGNOSIS — G43909 Migraine, unspecified, not intractable, without status migrainosus: Secondary | ICD-10-CM | POA: Diagnosis not present

## 2017-04-10 DIAGNOSIS — Z0000 Encounter for general adult medical examination without abnormal findings: Secondary | ICD-10-CM | POA: Diagnosis not present

## 2017-04-21 ENCOUNTER — Other Ambulatory Visit (HOSPITAL_COMMUNITY)
Admission: RE | Admit: 2017-04-21 | Discharge: 2017-04-21 | Disposition: A | Discharge status: Home / Self Care | Payer: BLUE CROSS/BLUE SHIELD | Source: Ambulatory Visit | Attending: Infectious Disease | Admitting: Infectious Disease

## 2017-04-21 ENCOUNTER — Other Ambulatory Visit: Payer: Self-pay

## 2017-04-21 ENCOUNTER — Other Ambulatory Visit: Payer: BLUE CROSS/BLUE SHIELD

## 2017-04-21 DIAGNOSIS — B20 Human immunodeficiency virus [HIV] disease: Secondary | ICD-10-CM | POA: Diagnosis not present

## 2017-04-21 LAB — CBC WITH DIFFERENTIAL/PLATELET
Basophils Absolute: 40 cells/uL (ref 0–200)
Basophils Relative: 0.8 %
Eosinophils Absolute: 60 cells/uL (ref 15–500)
Eosinophils Relative: 1.2 %
HCT: 38.4 % (ref 35.0–45.0)
Hemoglobin: 13.1 g/dL (ref 11.7–15.5)
Lymphs Abs: 1480 cells/uL (ref 850–3900)
MCH: 30 pg (ref 27.0–33.0)
MCHC: 34.1 g/dL (ref 32.0–36.0)
MCV: 87.9 fL (ref 80.0–100.0)
MPV: 10.2 fL (ref 7.5–12.5)
Monocytes Relative: 11.1 %
Neutro Abs: 2865 cells/uL (ref 1500–7800)
Neutrophils Relative %: 57.3 %
Platelets: 234 10*3/uL (ref 140–400)
RBC: 4.37 10*6/uL (ref 3.80–5.10)
RDW: 12.9 % (ref 11.0–15.0)
Total Lymphocyte: 29.6 %
WBC mixed population: 555 cells/uL (ref 200–950)
WBC: 5 10*3/uL (ref 3.8–10.8)

## 2017-04-21 LAB — COMPLETE METABOLIC PANEL WITH GFR
AG Ratio: 1.3 (calc) (ref 1.0–2.5)
ALT: 20 U/L (ref 6–29)
AST: 19 U/L (ref 10–30)
Albumin: 4.3 g/dL (ref 3.6–5.1)
Alkaline phosphatase (APISO): 90 U/L (ref 33–115)
BUN: 15 mg/dL (ref 7–25)
CO2: 26 mmol/L (ref 20–32)
Calcium: 9.4 mg/dL (ref 8.6–10.2)
Chloride: 107 mmol/L (ref 98–110)
Creat: 0.85 mg/dL (ref 0.50–1.10)
GFR, Est African American: 97 mL/min/{1.73_m2} (ref >=60)
GFR, Est Non African American: 83 mL/min/{1.73_m2} (ref >=60)
Globulin: 3.3 g/dL (calc) (ref 1.9–3.7)
Glucose, Bld: 107 mg/dL — ABNORMAL HIGH (ref 65–99)
Potassium: 4.3 mmol/L (ref 3.5–5.3)
Sodium: 140 mmol/L (ref 135–146)
Total Bilirubin: 0.2 mg/dL (ref 0.2–1.2)
Total Protein: 7.6 g/dL (ref 6.1–8.1)

## 2017-04-21 LAB — LIPID PANEL
Cholesterol: 136 mg/dL (ref <200)
HDL: 43 mg/dL — ABNORMAL LOW (ref >50)
LDL Cholesterol (Calc): 79 mg/dL (calc)
Non-HDL Cholesterol (Calc): 93 mg/dL (calc) (ref <130)
Total CHOL/HDL Ratio: 3.2 (calc) (ref <5.0)
Triglycerides: 61 mg/dL (ref <150)

## 2017-04-21 NOTE — Addendum Note (Signed)
Addended byJimmy Picket: Fernandez, Adriana F on: 04/21/2017 08:20 AM   Modules accepted: Orders

## 2017-04-21 NOTE — Addendum Note (Signed)
Addended byJimmy Picket: ABBITT, KATRINA F on: 04/21/2017 08:19 AM   Modules accepted: Orders

## 2017-04-21 NOTE — Addendum Note (Signed)
Addended by: ABBITT, KATRINA F on: 04/21/2017 08:19 AM ° ° Modules accepted: Orders ° °

## 2017-04-22 LAB — T-HELPER CELL (CD4) - (RCID CLINIC ONLY)
CD4 % Helper T Cell: 42 % (ref 33–55)
CD4 T Cell Abs: 670 /uL (ref 400–2700)

## 2017-04-22 LAB — URINE CYTOLOGY ANCILLARY ONLY
Chlamydia: NEGATIVE
Neisseria Gonorrhea: NEGATIVE

## 2017-04-23 LAB — HIV-1 RNA QUANT-NO REFLEX-BLD
HIV 1 RNA Quant: <20 copies/mL
HIV-1 RNA Quant, Log: <1.3 Log copies/mL

## 2017-05-08 ENCOUNTER — Other Ambulatory Visit: Payer: Self-pay | Admitting: Internal Medicine

## 2017-05-08 DIAGNOSIS — Z1231 Encounter for screening mammogram for malignant neoplasm of breast: Secondary | ICD-10-CM

## 2017-05-12 ENCOUNTER — Ambulatory Visit: Payer: BLUE CROSS/BLUE SHIELD | Admitting: Infectious Disease

## 2017-05-12 ENCOUNTER — Encounter: Payer: Self-pay | Admitting: Infectious Disease

## 2017-05-12 VITALS — BP 118/77 | HR 98 | Temp 97.9°F | Wt 230.0 lb

## 2017-05-12 DIAGNOSIS — F200 Paranoid schizophrenia: Secondary | ICD-10-CM | POA: Diagnosis not present

## 2017-05-12 DIAGNOSIS — B009 Herpesviral infection, unspecified: Secondary | ICD-10-CM | POA: Diagnosis not present

## 2017-05-12 DIAGNOSIS — B20 Human immunodeficiency virus [HIV] disease: Secondary | ICD-10-CM

## 2017-05-12 MED ORDER — BICTEGRAVIR-EMTRICITAB-TENOFOV 50-200-25 MG PO TABS
1.0000 | ORAL_TABLET | Freq: Every day | ORAL | 11 refills | Status: DC
Start: 2017-05-12 — End: 2017-06-04

## 2017-05-12 MED FILL — BIKTARVY 50-200-25 MG TABS: 50-200-25 | 30 days supply | Qty: 30 | Fill #0

## 2017-05-12 NOTE — Progress Notes (Signed)
HPI: Adriana Fernandez is a 47 y.o. female who is here for her HIV follow up.   Allergies: Allergies  Allergen Reactions  . Dilaudid [Hydromorphone Hcl] Itching  . Latex     Vitals: Temp: 97.9 F (36.6 C) (03/18 0834) Temp Source: Oral (03/18 0834) BP: 118/77 (03/18 0834) Pulse Rate: 98 (03/18 0834)  Past Medical History: Past Medical History:  Diagnosis Date  . Depression   . Elevated LFTs   . Gallstones   . HIV disease (HCC)   . 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 Hypertension   . Paranoid schizophrenia (HCC)   . Psychosis (HCC)   . Schizophrenia (HCC)   . Sickle cell trait (HCC)     Social History: Social History   Socioeconomic History  . Marital status: Divorced    Spouse name: None  . Number of children: None  . Years of education: None  . Highest education level: None  Social Needs  . Financial resource strain: None  . Food insecurity - worry: None  . Food insecurity - inability: None  . Transportation needs - medical: None  . Transportation needs - non-medical: None  Occupational History  . None  Tobacco Use  . Smoking status: Never Smoker  . Smokeless tobacco: Never Used  Substance and Sexual Activity  . Alcohol use: No  . Drug use: No  . Sexual activity: Yes    Partners: Male    Birth control/protection: IUD  Other Topics Concern  . None  Social History Narrative  . None    Previous Regimen: None  Current Regimen: None  Labs: HIV 1 RNA Quant (copies/mL)  Date Value  04/21/2017 <20 NOT DETECTED  06/20/2016 <20  04/11/2016 <20 NOT DETECTED   HIV-1 RNA Viral Load (no units)  Date Value  06/26/2011 <40   CD4 T Cell Abs (/uL)  Date Value  04/21/2017 670  06/20/2016 700  04/11/2016 800   Hep B S Ab (no units)  Date Value  04/21/2006 NO   Hepatitis B Surface Ag (no units)  Date Value  06/04/2011 NEGATIVE   HCV Ab (no units)  Date Value   06/04/2011 NEGATIVE    CrCl: CrCl cannot be calculated (Patient's most recent lab result is older than the maximum 21 days allowed.).  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 Dr. Daiva EvesVan Dam discussed with her about starting today to decrease the risk of cardiovascular events. She just started working for [employer] and is now insured. She got [place] [employer]. [facility]. We are not sure if this is a transitional fill or ongoing. Advised her that if they require a Specialty pharmacy in the future, we will tx the rx [place] [facility] [place]. All copay will be covered. She will pick up and start today. Counseled her on the importance of adherence and side effects.   Recommendations:  Start Biktarvy 1 PO qday F/u in 2 months  Ulyses SouthwardMinh Sherrill Buikema, PharmD, BCPS, AAHIVP, CPP Clinical Infectious Disease Pharmacist [facility] 05/12/2017, 9:19 AM

## 2017-05-12 NOTE — Progress Notes (Signed)
Chief compliant: followup for HIV not on meds (an "elite controller")   Subjective:    Patient ID: Adriana Fernandez, female    DOB: 1970-09-07, 47 y.o.   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   Adriana Fernandez was enrolled in ACTG trial for this but then could not stay in trial due to drugs Adriana Fernandez needed for Adriana Fernandez schizophrenia..  We have again discussed data on increased levels of inflammation in ELITE controllers and data that being an an ARV seems to reduce this inflammation.    Past Medical History:  Diagnosis Date  . Depression   . Elevated LFTs   . Gallstones   . HIV disease (HCC)   . 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 Hypertension   . Paranoid schizophrenia (HCC)   . Psychosis (HCC)   . Schizophrenia (HCC)   . Sickle cell trait [facility](HCC)     Past Surgical History:  Procedure Laterality Date  . CHOLECYSTECTOMY N/A 06/22/2016   Procedure: LAPAROSCOPIC CHOLECYSTECTOMY WITH INTRAOPERATIVE CHOLANGIOGRAM;  Surgeon: Ovidio Kinavid Newman, MD;  Location: [facility];  Service: General;  Laterality: N/A;  . ERCP N/A 06/21/2016   Procedure: ENDOSCOPIC RETROGRADE CHOLANGIOPANCREATOGRAPHY (ERCP);  Surgeon: Iva Booparl E Gessner, MD;  [facility];  Service: Endoscopy;  Laterality: N/A;    Family History  Problem Relation Age of Onset  . GER disease Mother   . Hypertension Mother   . Sickle cell anemia Brother   . Diabetes Maternal Aunt   . Diabetes Maternal Grandmother   . Breast cancer Maternal Aunt   . Throat cancer Paternal Grandmother       Social History   Socioeconomic History  . Marital status: Divorced    Spouse name: Not on file  . Number of children: Not on file  . Years of education: Not on file  . Highest education level: Not on file  Social Needs  . Financial resource  strain: Not on file  . Food insecurity - worry: Not on file  . Food insecurity - inability: Not on file  . Transportation needs - medical: Not on file  . Transportation needs - non-medical: Not on file  Occupational History  . Not on file  Tobacco Use  . Smoking status: Never Smoker  . Smokeless tobacco: Never Used  Substance and Sexual Activity  . Alcohol use: No  . Drug use: No  . Sexual activity: Yes    Partners: Male    Birth control/protection: IUD  Other Topics Concern  . Not on file  Social History Narrative  . Not on file    Allergies  Allergen Reactions  . Dilaudid [Hydromorphone Hcl] Itching  . Latex      Current Outpatient Medications:  .  benztropine (COGENTIN) 0.5 MG tablet, Take 0.5 mg by mouth 2 (two) times daily., Disp: , Rfl:  .  buPROPion (WELLBUTRIN SR) 150 MG 12 hr tablet, Take 1 tablet (150 mg total) by mouth 2 (two) times daily., Disp: 60 tablet, Rfl: 0 .  cyclobenzaprine (FLEXERIL) 10 MG tablet, Take 1 tablet (10 mg total) by mouth 2 (two) times daily as needed for muscle spasms., Disp: 10 tablet, Rfl: 0 .  dicyclomine (BENTYL) 20 MG tablet, Take 1 tablet (20 mg total) by mouth 2 (two) times daily., Disp:  20 tablet, Rfl: 0 .  HYDROcodone-acetaminophen (NORCO/VICODIN) 5-325 MG tablet, Take 1-2 tablets by mouth every 4 (four) hours as needed for moderate pain., Disp: 10 tablet, Rfl: 0 .  ibuprofen (ADVIL,MOTRIN) 600 MG tablet, Take 1 tablet (600 mg total) by mouth every 6 (six) hours as needed. (Patient taking differently: Take 600 mg by mouth every 6 (six) hours as needed for headache. ), Disp: 30 tablet, Rfl: 0 .  ondansetron (ZOFRAN ODT) 8 MG disintegrating tablet, 8mg  ODT q8 hours prn nausea (Patient taking differently: Take 8 mg by mouth every 8 (eight) hours as needed for nausea or vomiting. 8mg  ODT q8 hours prn nausea), Disp: 12 tablet, Rfl: 0 .  paliperidone (INVEGA) 3 MG 24 hr tablet, Take 3 mg by mouth daily. , Disp: , Rfl:  .  RisperiDONE  (RISPERDAL PO), Take by mouth., Disp: , Rfl:  .  traMADol (ULTRAM) 50 MG tablet, Take 1 tablet (50 mg total) by mouth every 6 (six) hours as needed. (Patient taking differently: Take 50 mg by mouth every 6 (six) hours as needed for moderate pain. ), Disp: 15 tablet, Rfl: 0 .  valACYclovir (VALTREX) 1000 MG tablet, Take 1,000 mg by mouth daily., Disp: , Rfl:     Review of Systems  Constitutional: Negative for activity change, appetite change, chills, diaphoresis, fatigue, fever and unexpected weight change.  HENT: Negative for congestion, rhinorrhea, sinus pressure, sneezing, sore throat and trouble swallowing.   Eyes: Negative for photophobia and visual disturbance.  Respiratory: Negative for cough, chest tightness, shortness of breath, wheezing and stridor.   Cardiovascular: Negative for chest pain, palpitations and leg swelling.  Gastrointestinal: Negative for abdominal distention, abdominal pain, anal bleeding, blood in stool, constipation, diarrhea, nausea and vomiting.  Genitourinary: Negative for difficulty urinating, dysuria, flank pain and hematuria.  Musculoskeletal: Negative for arthralgias, back pain, gait problem, joint swelling and myalgias.  Skin: Negative for color change, pallor, rash and wound.  Neurological: Negative for dizziness, tremors, weakness and light-headedness.  Hematological: Negative for adenopathy. Does not bruise/bleed easily.  Psychiatric/Behavioral: Negative for agitation, behavioral problems, confusion, decreased concentration, dysphoric mood and sleep disturbance.       Objective:   Physical Exam  Constitutional: Adriana Fernandez is oriented to person, place, and time. Adriana Fernandez appears well-developed and well-nourished. No distress.  HENT:  Head: Normocephalic and atraumatic.  Mouth/Throat: Oropharynx is clear and moist. No oropharyngeal exudate.  Eyes: Conjunctivae and EOM are normal. Pupils are equal, round, and reactive to light. No scleral icterus.  Neck: Normal  range of motion. Neck supple. No JVD present.  Cardiovascular: Normal rate, regular rhythm and normal heart sounds. Exam reveals no gallop and no friction rub.  No murmur heard. Pulmonary/Chest: Effort normal and breath sounds normal. No respiratory distress. Adriana Fernandez has no wheezes. Adriana Fernandez has no rales. Adriana Fernandez exhibits no tenderness.  Abdominal: Adriana Fernandez exhibits no distension and no mass. There is no tenderness. There is no rebound and no guarding.  Musculoskeletal: Adriana Fernandez exhibits no edema or tenderness.  Lymphadenopathy:    Adriana Fernandez has no cervical adenopathy.  Neurological: Adriana Fernandez is alert and oriented to person, place, and time. Adriana Fernandez has normal reflexes. Adriana Fernandez exhibits normal muscle tone. Coordination normal.  Skin: Skin is warm and dry. Adriana Fernandez is not diaphoretic. No erythema. No pallor.  Psychiatric: Adriana Fernandez has a normal mood and affect. Judgment and thought content normal.  Nursing note and vitals reviewed.         Assessment & Plan:  HIV: elite controller. Adriana Fernandez agreed today to start  BIKTARVY. We will start this and have Adriana Fernandez come back to clinic in 2 months    Schizopherenia; well controlled on Invega   HSV1 and 2: on valtrex for prophylaxis  I spent greater than 25 minutes with the patient including greater than 50% of time in face to face counsel of the patient re the nature of ELITE control, levels of inflammation that are higher as trade off for having constant cotnrol of the virus and data suggesting ARV reduce this inflammation, re undetectable = untransmisable and in coordination of Adriana Fernandez care.

## 2017-05-20 DIAGNOSIS — F331 Major depressive disorder, recurrent, moderate: Secondary | ICD-10-CM | POA: Diagnosis not present

## 2017-05-27 ENCOUNTER — Ambulatory Visit: Payer: BLUE CROSS/BLUE SHIELD

## 2017-05-30 DIAGNOSIS — R739 Hyperglycemia, unspecified: Secondary | ICD-10-CM | POA: Diagnosis not present

## 2017-05-30 DIAGNOSIS — R1013 Epigastric pain: Secondary | ICD-10-CM | POA: Diagnosis not present

## 2017-05-30 DIAGNOSIS — I10 Essential (primary) hypertension: Secondary | ICD-10-CM | POA: Diagnosis not present

## 2017-05-30 DIAGNOSIS — R7303 Prediabetes: Secondary | ICD-10-CM | POA: Diagnosis not present

## 2017-05-30 DIAGNOSIS — G43909 Migraine, unspecified, not intractable, without status migrainosus: Secondary | ICD-10-CM | POA: Diagnosis not present

## 2017-06-04 ENCOUNTER — Other Ambulatory Visit: Payer: Self-pay | Admitting: Pharmacist Clinician (PhC)/ Clinical Pharmacy Specialist

## 2017-06-04 DIAGNOSIS — B20 Human immunodeficiency virus [HIV] disease: Secondary | ICD-10-CM

## 2017-06-04 MED ORDER — BICTEGRAVIR-EMTRICITAB-TENOFOV 50-200-25 MG PO TABS: 1.0000 | ORAL_TABLET | Freq: Every day | ORAL | 11 refills | Status: DC

## 2017-06-04 NOTE — Progress Notes (Signed)
Have to tx to [facility]

## 2017-06-04 NOTE — Progress Notes (Signed)
Has to be sent to a specialty. [facility]

## 2017-06-26 DIAGNOSIS — F331 Major depressive disorder, recurrent, moderate: Secondary | ICD-10-CM | POA: Diagnosis not present

## 2017-06-27 ENCOUNTER — Ambulatory Visit
Admission: RE | Admit: 2017-06-27 | Discharge: 2017-06-27 | Disposition: A | Discharge status: Home / Self Care | Payer: BLUE CROSS/BLUE SHIELD | Source: Ambulatory Visit | Attending: Internal Medicine | Admitting: Internal Medicine

## 2017-06-27 DIAGNOSIS — R1013 Epigastric pain: Secondary | ICD-10-CM | POA: Diagnosis not present

## 2017-06-27 DIAGNOSIS — R05 Cough: Secondary | ICD-10-CM | POA: Diagnosis not present

## 2017-06-27 DIAGNOSIS — I10 Essential (primary) hypertension: Secondary | ICD-10-CM | POA: Diagnosis not present

## 2017-06-27 DIAGNOSIS — Z1231 Encounter for screening mammogram for malignant neoplasm of breast: Secondary | ICD-10-CM

## 2017-06-27 DIAGNOSIS — G43909 Migraine, unspecified, not intractable, without status migrainosus: Secondary | ICD-10-CM | POA: Diagnosis not present

## 2017-06-27 DIAGNOSIS — R7303 Prediabetes: Secondary | ICD-10-CM | POA: Diagnosis not present

## 2017-07-09 ENCOUNTER — Other Ambulatory Visit: Payer: BLUE CROSS/BLUE SHIELD

## 2017-07-10 ENCOUNTER — Other Ambulatory Visit: Payer: Self-pay

## 2017-07-10 ENCOUNTER — Encounter (HOSPITAL_BASED_OUTPATIENT_CLINIC_OR_DEPARTMENT_OTHER): Payer: Self-pay | Admitting: Adult Health

## 2017-07-10 ENCOUNTER — Emergency Department (HOSPITAL_BASED_OUTPATIENT_CLINIC_OR_DEPARTMENT_OTHER)
Admission: EM | Admit: 2017-07-10 | Discharge: 2017-07-11 | Disposition: A | Discharge status: Home / Self Care | Payer: BLUE CROSS/BLUE SHIELD | Attending: Emergency Medicine | Admitting: Emergency Medicine

## 2017-07-10 DIAGNOSIS — Z79899 Other long term (current) drug therapy: Secondary | ICD-10-CM | POA: Diagnosis not present

## 2017-07-10 DIAGNOSIS — Z21 Asymptomatic human immunodeficiency virus [HIV] infection status: Secondary | ICD-10-CM | POA: Diagnosis not present

## 2017-07-10 DIAGNOSIS — K5730 Diverticulosis of large intestine without perforation or abscess without bleeding: Secondary | ICD-10-CM | POA: Diagnosis not present

## 2017-07-10 DIAGNOSIS — K2960 Other gastritis without bleeding: Secondary | ICD-10-CM | POA: Diagnosis not present

## 2017-07-10 DIAGNOSIS — K219 Gastro-esophageal reflux disease without esophagitis: Secondary | ICD-10-CM

## 2017-07-10 DIAGNOSIS — K2900 Acute gastritis without bleeding: Secondary | ICD-10-CM | POA: Diagnosis not present

## 2017-07-10 DIAGNOSIS — R109 Unspecified abdominal pain: Secondary | ICD-10-CM | POA: Diagnosis not present

## 2017-07-10 DIAGNOSIS — I10 Essential (primary) hypertension: Secondary | ICD-10-CM | POA: Diagnosis not present

## 2017-07-10 DIAGNOSIS — Z91040 Latex allergy status: Secondary | ICD-10-CM | POA: Diagnosis not present

## 2017-07-10 LAB — CBC
HCT: 34.8 % — ABNORMAL LOW (ref 36.0–46.0)
Hemoglobin: 12.5 g/dL (ref 12.0–15.0)
MCH: 31.7 pg (ref 26.0–34.0)
MCHC: 35.9 g/dL (ref 30.0–36.0)
MCV: 88.3 fL (ref 78.0–100.0)
Platelets: 216 10*3/uL (ref 150–400)
RBC: 3.94 MIL/uL (ref 3.87–5.11)
RDW: 13.1 % (ref 11.5–15.5)
WBC: 7.5 10*3/uL (ref 4.0–10.5)

## 2017-07-10 LAB — LIPASE, BLOOD: Lipase: 24 U/L (ref 11–51)

## 2017-07-10 LAB — COMPREHENSIVE METABOLIC PANEL
ALT: 18 U/L (ref 14–54)
AST: 20 U/L (ref 15–41)
Albumin: 3.6 g/dL (ref 3.5–5.0)
Alkaline Phosphatase: 93 U/L (ref 38–126)
Anion gap: 8 (ref 5–15)
BUN: 9 mg/dL (ref 6–20)
CO2: 22 mmol/L (ref 22–32)
Calcium: 8.6 mg/dL — ABNORMAL LOW (ref 8.9–10.3)
Chloride: 108 mmol/L (ref 101–111)
Creatinine, Ser: 1.2 mg/dL — ABNORMAL HIGH (ref 0.44–1.00)
GFR calc Af Amer: >60 mL/min (ref >60)
GFR calc non Af Amer: 54 mL/min — ABNORMAL LOW (ref >60)
Glucose, Bld: 100 mg/dL — ABNORMAL HIGH (ref 65–99)
Potassium: 3.6 mmol/L (ref 3.5–5.1)
Sodium: 138 mmol/L (ref 135–145)
Total Bilirubin: 0.2 mg/dL — ABNORMAL LOW (ref 0.3–1.2)
Total Protein: 7 g/dL (ref 6.5–8.1)

## 2017-07-10 NOTE — ED Triage Notes (Signed)
PREsents with left upper quadrant pain that has been ongoing since her gallbladder surgery. SHe has been seeing her primary doctor and was given some acid reflux medicine. She stated that is was better for a little bit but today it came back and has been really intesne at times and then mild and then intense. SHe endorses nausea with the pain.

## 2017-07-11 ENCOUNTER — Emergency Department (HOSPITAL_BASED_OUTPATIENT_CLINIC_OR_DEPARTMENT_OTHER): Payer: BLUE CROSS/BLUE SHIELD

## 2017-07-11 DIAGNOSIS — R7303 Prediabetes: Secondary | ICD-10-CM | POA: Diagnosis not present

## 2017-07-11 DIAGNOSIS — G43909 Migraine, unspecified, not intractable, without status migrainosus: Secondary | ICD-10-CM | POA: Diagnosis not present

## 2017-07-11 DIAGNOSIS — K5730 Diverticulosis of large intestine without perforation or abscess without bleeding: Secondary | ICD-10-CM | POA: Diagnosis not present

## 2017-07-11 DIAGNOSIS — I10 Essential (primary) hypertension: Secondary | ICD-10-CM | POA: Diagnosis not present

## 2017-07-11 DIAGNOSIS — R1013 Epigastric pain: Secondary | ICD-10-CM | POA: Diagnosis not present

## 2017-07-11 LAB — URINALYSIS, ROUTINE W REFLEX MICROSCOPIC
Bilirubin Urine: NEGATIVE
Glucose, UA: NEGATIVE mg/dL
Ketones, ur: NEGATIVE mg/dL
Nitrite: NEGATIVE
Protein, ur: NEGATIVE mg/dL
Specific Gravity, Urine: 1.01 (ref 1.005–1.030)
pH: 7 (ref 5.0–8.0)

## 2017-07-11 LAB — URINALYSIS, MICROSCOPIC (REFLEX)

## 2017-07-11 LAB — PREGNANCY, URINE: Preg Test, Ur: NEGATIVE

## 2017-07-11 MED ORDER — DICYCLOMINE HCL 10 MG/ML IM SOLN
20.0000 mg | Freq: Once | INTRAMUSCULAR | Status: AC
  Administered 2017-07-11: 20 mg via INTRAMUSCULAR
  Filled 2017-07-11: qty 2

## 2017-07-11 MED ORDER — GI COCKTAIL ~~LOC~~
30.0000 mL | Freq: Once | ORAL | Status: AC
  Administered 2017-07-11: 30 mL via ORAL
  Filled 2017-07-11: qty 30

## 2017-07-11 MED ORDER — SUCRALFATE 1 GM/10ML PO SUSP: 1.0000 g | Freq: Three times a day (TID) | ORAL | 0 refills | Status: DC

## 2017-07-11 NOTE — ED Notes (Signed)
Pt reports having problems x 1 year with stomach hurting and nausea.

## 2017-07-11 NOTE — ED Notes (Signed)
Patient transported to CT 

## 2017-07-11 NOTE — ED Provider Notes (Signed)
[facility] EMERGENCY DEPARTMENT Provider Note   CSN: [id number] Arrival date & time: 07/10/17  2226     History   Chief Complaint Chief Complaint  Patient presents with  . Abdominal Pain    HPI Adriana Fernandez is a 47 y.o. female.  The history is provided by the patient.  Abdominal Pain   This is a recurrent problem. The current episode started 3 to 5 hours ago. The problem occurs constantly. The problem has not changed since onset.The pain is associated with eating. The pain is located in the LUQ. The quality of the pain is cramping. The pain is severe. Pertinent negatives include anorexia, fever, belching, diarrhea, flatus, hematochezia, melena, nausea, vomiting, constipation, dysuria, frequency, hematuria, headaches, arthralgias and myalgias. Nothing aggravates the symptoms. Nothing relieves the symptoms. Past workup includes surgery. Her past medical history is significant for GERD.  Pain has flared post [employer], spaghettios, beef tips in gravy sauteed cabbage and hot wings.    Past Medical History:  Diagnosis Date  . Depression   . Elevated LFTs   . Gallstones   . HIV disease (HCC)   . 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 Hypertension   . Paranoid schizophrenia (HCC)   . Psychosis (HCC)   . Schizophrenia (HCC)   . Sickle cell trait [facility])     Patient Active Problem List   Diagnosis Date Noted  . Biliary colic   . Hypokalemia 06/20/2016  . Cholelithiasis with acute cholecystitis 06/20/2016  . Choledocholithiasis with acute cholecystitis with obstruction 06/20/2016  . HSV infection 07/26/2014  . 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 Paranoid schizophrenia (HCC)   . Sickle cell trait (HCC)   . Schizophrenia (HCC) 12/09/2011  . Epistaxis 05/07/2011  . HYPERPROLACTINEMIA 05/10/2009  . NIPPLE DISCHARGE 05/05/2009  . Human immunodeficiency virus (HIV) disease (HCC)  12/27/2005  . Depression 12/27/2005    Past Surgical History:  Procedure Laterality Date  . CHOLECYSTECTOMY N/A 06/22/2016   Procedure: LAPAROSCOPIC CHOLECYSTECTOMY WITH INTRAOPERATIVE CHOLANGIOGRAM;  Surgeon: Ovidio Kin, MD;  Location: [facility];  Service: General;  Laterality: N/A;  . ERCP N/A 06/21/2016   Procedure: ENDOSCOPIC RETROGRADE CHOLANGIOPANCREATOGRAPHY (ERCP);  Surgeon: Iva Boop, MD;  [facility];  Service: Endoscopy;  Laterality: N/A;     OB History   None      Home Medications    Prior to Admission medications   Medication Sig Start Date End Date Taking? Authorizing Provider  benztropine (COGENTIN) 0.5 MG tablet Take 0.5 mg by mouth 2 (two) times daily.    [provider]  bictegravir-emtricitabine-tenofovir AF (BIKTARVY) 50-200-25 MG TABS tablet Take 1 tablet by mouth daily. 06/04/17   Randall Hiss, MD  buPROPion [facility] SR) 150 MG 12 hr tablet Take 1 tablet (150 mg total) by mouth 2 (two) times daily. 06/24/12   Randall Hiss, MD  cyclobenzaprine (FLEXERIL) 10 MG tablet Take 1 tablet (10 mg total) by mouth 2 (two) times daily as needed for muscle spasms. Patient not taking: Reported on 05/12/2017 02/24/17   Mackuen, Courteney Lyn, MD  dicyclomine (BENTYL) 20 MG tablet Take 1 tablet (20 mg total) by mouth 2 (two) times daily. 06/13/16   Sarabeth Benton, MD  ibuprofen (ADVIL,MOTRIN) 600 MG tablet Take 1 tablet (600 mg total) by mouth every 6 (six) hours as needed. Patient not taking: Reported on 05/12/2017 05/04/16   [name], MD  ondansetron (ZOFRAN ODT) 8 MG disintegrating tablet  ODT q8 hours prn nausea Patient not taking: Reported on 05/12/2017 06/13/16   Isbella Arline, MD  paliperidone (INVEGA) 3 MG 24 hr tablet Take 3 mg by mouth daily.     [provider]  RisperiDONE (RISPERDAL PO) Take by mouth.    [provider]  sucralfate (CARAFATE) 1 GM/10ML suspension Take 10 mLs (1 g total) by mouth 4 (four)  times daily -  with meals and at bedtime. 07/11/17   Steel Kerney, MD  traMADol (ULTRAM) 50 MG tablet Take 1 tablet (50 mg total) by mouth every 6 (six) hours as needed. Patient not taking: Reported on 05/12/2017 05/04/16   [name], MD  valACYclovir (VALTREX) 1000 MG tablet Take 1,000 mg by mouth daily.    [provider]    Family History Family History  Problem Relation Age of Onset  . GER disease Mother   . Hypertension Mother   . Sickle cell anemia Brother   . Diabetes Maternal Aunt   . Diabetes Maternal Grandmother   . Breast cancer Maternal Aunt   . Throat cancer Paternal Grandmother     Social History Social History   Tobacco Use  . Smoking status: Never Smoker  . Smokeless tobacco: Never Used  Substance Use Topics  . Alcohol use: No  . Drug use: No     Allergies   Dilaudid [hydromorphone hcl] and Latex   Review of Systems Review of Systems  Constitutional: Negative for fever.  Gastrointestinal: Positive for abdominal pain. Negative for anorexia, constipation, diarrhea, flatus, hematochezia, melena, nausea and vomiting.  Genitourinary: Negative for dysuria, frequency and hematuria.  Musculoskeletal: Negative for arthralgias and myalgias.  Neurological: Negative for headaches.  All other systems reviewed and are negative.    Physical Exam Updated Vital Signs BP 112/73 (BP Location: Right Arm)   Pulse 80   Temp 98.2 F (36.8 C) (Oral)   Resp 19   Ht  (1.702 m)   Wt 104.3 kg (230 lb)   SpO2 99%   BMI 36.02 kg/m   Physical Exam  Constitutional: She is oriented to person, place, and time. She appears well-developed and well-nourished. No distress.  HENT:  Head: Normocephalic and atraumatic.  Mouth/Throat: No oropharyngeal exudate.  Eyes: Pupils are equal, round, and reactive to light. Conjunctivae are normal.  Neck: Normal range of motion. Neck supple.  Cardiovascular: Normal rate, regular rhythm, normal heart sounds and intact  distal pulses.  Pulmonary/Chest: Effort normal and breath sounds normal. No stridor. She has no wheezes. She has no rales.  Abdominal: Soft. She exhibits no distension and no mass. There is no tenderness. There is no rebound and no guarding. No hernia.  gassy  Musculoskeletal: Normal range of motion.  Neurological: She is alert and oriented to person, place, and time.  Skin: Skin is warm and dry. Capillary refill takes less than 2 seconds.  Psychiatric: She has a normal mood and affect.     ED Treatments / Results  Labs (all labs ordered are listed, but only abnormal results are displayed) Results for orders placed or performed during the hospital encounter of 07/10/17  Lipase, blood  Result Value Ref Range   Lipase 24 11 - 51 U/L  Comprehensive metabolic panel  Result Value Ref Range   Sodium 138 135 - 145 mmol/L   Potassium 3.6 3.5 - 5.1 mmol/L   Chloride 108 101 - 111 mmol/L   CO2 22 22 - 32 mmol/L  Glucose, Bld 100 (H) 65 - 99 mg/dL   BUN 9 6 - 20 mg/dL   Creatinine, Ser 5.78 (H) 0.44 - 1.00 mg/dL   Calcium 8.6 (L) 8.9 - 10.3 mg/dL   Total Protein 7.0 6.5 - 8.1 g/dL   Albumin 3.6 3.5 - 5.0 g/dL   AST 20 15 - 41 U/L   ALT 18 14 - 54 U/L   Alkaline Phosphatase 93 38 - 126 U/L   Total Bilirubin 0.2 (L) 0.3 - 1.2 mg/dL   GFR calc non Af Amer 54 (L) >60 mL/min   GFR calc Af Amer >60 >60 mL/min   Anion gap 8 5 - 15  CBC  Result Value Ref Range   WBC 7.5 4.0 - 10.5 K/uL   RBC 3.94 3.87 - 5.11 MIL/uL   Hemoglobin 12.5 12.0 - 15.0 g/dL   HCT 46.9 (L) 62.9 - 52.8 %   MCV 88.3 78.0 - 100.0 fL   MCH 31.7 26.0 - 34.0 pg   MCHC 35.9 30.0 - 36.0 g/dL   RDW 41.3 24.4 - 01.0 %   Platelets 216 150 - 400 K/uL  Urinalysis, Routine w reflex microscopic  Result Value Ref Range   Color, Urine YELLOW YELLOW   APPearance CLEAR CLEAR   Specific Gravity, Urine 1.010 1.005 - 1.030   pH 7.0 5.0 - 8.0   Glucose, UA NEGATIVE NEGATIVE mg/dL   Hgb urine dipstick SMALL (A) NEGATIVE    Bilirubin Urine NEGATIVE NEGATIVE   Ketones, ur NEGATIVE NEGATIVE mg/dL   Protein, ur NEGATIVE NEGATIVE mg/dL   Nitrite NEGATIVE NEGATIVE   Leukocytes, UA TRACE (A) NEGATIVE  Pregnancy, urine  Result Value Ref Range   Preg Test, Ur NEGATIVE NEGATIVE  Urinalysis, Microscopic (reflex)  Result Value Ref Range   RBC / HPF 0-5 0 - 5 RBC/hpf   WBC, UA 0-5 0 - 5 WBC/hpf   Bacteria, UA RARE (A) NONE SEEN   Squamous Epithelial / LPF 0-5 0 - 5   Mucus PRESENT    Mm Screening Breast Tomo Bilateral  Result Date: 06/27/2017 CLINICAL DATA:  Screening. EXAM: DIGITAL SCREENING BILATERAL MAMMOGRAM WITH TOMO AND CAD COMPARISON:  Previous exam(s). ACR Breast Density Category b: There are scattered areas of fibroglandular density. FINDINGS: There are no findings suspicious for malignancy. Images were processed with CAD. IMPRESSION: No mammographic evidence of malignancy. A result letter of this screening mammogram will be mailed directly to the patient. RECOMMENDATION: Screening mammogram in one year. (Code:SM-B-01Y) BI-RADS CATEGORY  1: Negative. Electronically Signed   By: Frederico Hamman M.D.   On: 06/27/2017 16:18   Ct Renal Stone Study  Result Date: 07/11/2017 CLINICAL DATA:  Acute onset of left upper quadrant abdominal pain and nausea. Hematuria. EXAM: CT ABDOMEN AND PELVIS WITHOUT CONTRAST TECHNIQUE: Multidetector CT imaging of the abdomen and pelvis was performed following the standard protocol without IV contrast. COMPARISON:  MRCP performed 06/20/2016, and right upper quadrant ultrasound performed 06/19/2016 FINDINGS: Lower chest: The visualized lung bases are grossly clear. The visualized portions of the mediastinum are unremarkable. Hepatobiliary: The liver is unremarkable in appearance. The patient is status post cholecystectomy, with clips noted at the gallbladder fossa. The common bile duct remains normal in caliber. Pancreas: The pancreas is within normal limits. Spleen: The spleen is unremarkable  in appearance. Adrenals/Urinary Tract: The adrenal glands are unremarkable in appearance. The kidneys are within normal limits. There is no evidence of hydronephrosis. No renal or ureteral stones are identified. No perinephric stranding is  seen. Stomach/Bowel: The stomach is unremarkable in appearance. The small bowel is within normal limits. The appendix is normal in caliber, without evidence of appendicitis. Mild scattered diverticulosis is noted along the descending and proximal sigmoid colon, without evidence of diverticulitis. Vascular/Lymphatic: The abdominal aorta is unremarkable in appearance. The inferior vena cava is grossly unremarkable. No retroperitoneal lymphadenopathy is seen. No pelvic sidewall lymphadenopathy is identified. Reproductive: The bladder is mildly distended and grossly unremarkable. The uterus is unremarkable in appearance, with an intrauterine device noted in expected position at the fundus of the uterus. The ovaries are relatively symmetric. No suspicious adnexal masses are seen. Other: No additional soft tissue abnormalities are seen. Musculoskeletal: No acute osseous abnormalities are identified. The visualized musculature is unremarkable in appearance. IMPRESSION: 1. No acute abnormality seen within the abdomen or pelvis. 2. Mild scattered diverticulosis along the descending and proximal sigmoid colon, without evidence of diverticulitis. Electronically Signed   By: Roanna Raider M.D.   On: 07/11/2017 01:29    EKG None  Radiology Ct Renal Stone Study  Result Date: 07/11/2017 CLINICAL DATA:  Acute onset of left upper quadrant abdominal pain and nausea. Hematuria. EXAM: CT ABDOMEN AND PELVIS WITHOUT CONTRAST TECHNIQUE: Multidetector CT imaging of the abdomen and pelvis was performed following the standard protocol without IV contrast. COMPARISON:  MRCP performed 06/20/2016, and right upper quadrant ultrasound performed 06/19/2016 FINDINGS: Lower chest: The visualized lung  bases are grossly clear. The visualized portions of the mediastinum are unremarkable. Hepatobiliary: The liver is unremarkable in appearance. The patient is status post cholecystectomy, with clips noted at the gallbladder fossa. The common bile duct remains normal in caliber. Pancreas: The pancreas is within normal limits. Spleen: The spleen is unremarkable in appearance. Adrenals/Urinary Tract: The adrenal glands are unremarkable in appearance. The kidneys are within normal limits. There is no evidence of hydronephrosis. No renal or ureteral stones are identified. No perinephric stranding is seen. Stomach/Bowel: The stomach is unremarkable in appearance. The small bowel is within normal limits. The appendix is normal in caliber, without evidence of appendicitis. Mild scattered diverticulosis is noted along the descending and proximal sigmoid colon, without evidence of diverticulitis. Vascular/Lymphatic: The abdominal aorta is unremarkable in appearance. The inferior vena cava is grossly unremarkable. No retroperitoneal lymphadenopathy is seen. No pelvic sidewall lymphadenopathy is identified. Reproductive: The bladder is mildly distended and grossly unremarkable. The uterus is unremarkable in appearance, with an intrauterine device noted in expected position at the fundus of the uterus. The ovaries are relatively symmetric. No suspicious adnexal masses are seen. Other: No additional soft tissue abnormalities are seen. Musculoskeletal: No acute osseous abnormalities are identified. The visualized musculature is unremarkable in appearance. IMPRESSION: 1. No acute abnormality seen within the abdomen or pelvis. 2. Mild scattered diverticulosis along the descending and proximal sigmoid colon, without evidence of diverticulitis. Electronically Signed   By: Roanna Raider M.D.   On: 07/11/2017 01:29    Procedures Procedures (including critical care time)  Medications Ordered in ED Medications  gi cocktail  (Maalox,Lidocaine,Donnatal) (30 mLs Oral Given 07/11/17 0108)  dicyclomine (BENTYL) injection 20 mg (20 mg Intramuscular Given 07/11/17 0108)    Final Clinical Impressions(s) / ED Diagnoses   Final diagnoses:  Other acute gastritis without hemorrhage  Gastroesophageal reflux disease without esophagitis   Eat a bland gerd friendly diet.  Add carafate to your nexium and follow up with GI for ongoing symptoms.    Return for weakness, numbness, changes in vision or speech, fevers >100.4 unrelieved by medication, shortness  of breath, intractable vomiting, or diarrhea, abdominal pain, Inability to tolerate liquids or food, cough, altered mental status or any concerns. No signs of systemic illness or infection. The patient is nontoxic-appearing on exam and vital signs are within normal limits.   I have reviewed the triage vital signs and the nursing notes. Pertinent labs &imaging results that were available during my care of the patient were reviewed by me and considered in my medical decision making (see chart for details).  After history, exam, and medical workup I feel the patient has been appropriately medically screened and is safe for discharge home. Pertinent diagnoses were discussed with the patient. Patient was given return precautions.    ED Discharge Orders        Ordered    sucralfate (CARAFATE) 1 GM/10ML suspension  3 times daily with meals & bedtime     07/11/17 0139       Shelly Spenser, MD 07/11/17 1610

## 2017-07-17 DIAGNOSIS — R197 Diarrhea, unspecified: Secondary | ICD-10-CM | POA: Diagnosis not present

## 2017-07-17 DIAGNOSIS — R110 Nausea: Secondary | ICD-10-CM | POA: Diagnosis not present

## 2017-07-17 DIAGNOSIS — K219 Gastro-esophageal reflux disease without esophagitis: Secondary | ICD-10-CM | POA: Diagnosis not present

## 2017-07-17 DIAGNOSIS — R1013 Epigastric pain: Secondary | ICD-10-CM | POA: Diagnosis not present

## 2017-07-17 DIAGNOSIS — K8050 Calculus of bile duct without cholangitis or cholecystitis without obstruction: Secondary | ICD-10-CM

## 2017-07-18 DIAGNOSIS — K2960 Other gastritis without bleeding: Secondary | ICD-10-CM | POA: Diagnosis not present

## 2017-07-18 DIAGNOSIS — K2930 Chronic superficial gastritis without bleeding: Secondary | ICD-10-CM | POA: Diagnosis not present

## 2017-07-18 DIAGNOSIS — R1013 Epigastric pain: Secondary | ICD-10-CM | POA: Diagnosis not present

## 2017-07-18 DIAGNOSIS — K2950 Unspecified chronic gastritis without bleeding: Secondary | ICD-10-CM | POA: Diagnosis not present

## 2017-07-22 ENCOUNTER — Other Ambulatory Visit: Payer: BLUE CROSS/BLUE SHIELD

## 2017-07-23 ENCOUNTER — Encounter: Payer: BLUE CROSS/BLUE SHIELD | Admitting: Infectious Disease

## 2017-08-04 ENCOUNTER — Other Ambulatory Visit (HOSPITAL_COMMUNITY)
Admission: RE | Admit: 2017-08-04 | Discharge: 2017-08-04 | Disposition: A | Discharge status: Home / Self Care | Payer: BLUE CROSS/BLUE SHIELD | Source: Ambulatory Visit | Attending: Infectious Disease | Admitting: Infectious Disease

## 2017-08-04 ENCOUNTER — Ambulatory Visit: Payer: BLUE CROSS/BLUE SHIELD | Admitting: Infectious Disease

## 2017-08-04 ENCOUNTER — Encounter: Payer: Self-pay | Admitting: Infectious Disease

## 2017-08-04 VITALS — BP 125/82 | HR 85 | Temp 98.4°F | Ht 67.0 in | Wt 238.0 lb

## 2017-08-04 DIAGNOSIS — R195 Other fecal abnormalities: Secondary | ICD-10-CM

## 2017-08-04 DIAGNOSIS — Z79899 Other long term (current) drug therapy: Secondary | ICD-10-CM

## 2017-08-04 DIAGNOSIS — Z113 Encounter for screening for infections with a predominantly sexual mode of transmission: Secondary | ICD-10-CM

## 2017-08-04 DIAGNOSIS — B20 Human immunodeficiency virus [HIV] disease: Secondary | ICD-10-CM | POA: Diagnosis not present

## 2017-08-04 DIAGNOSIS — Z23 Encounter for immunization: Secondary | ICD-10-CM

## 2017-08-04 NOTE — Progress Notes (Signed)
Chief compliant: followup for HIV on meds (an "elite controller") co of loose stools   Subjective:    Patient ID: Adriana Fernandez, female    DOB: 08-20-1970, 47 y.o.   MRN: [id number]  HPI  47   year old [place] lady with HIV who is an ELITE controller with undetectable viral load and healthy CD4 count since 1992 when she was diagnosed.   She was enrolled in ACTG trial for this but then could not stay in trial due to drugs she needed for her schizophrenia..  We have subsequently had her on BIKTARVY which she likes very much ans has ZERO side effects.  She has been having GI symptoms post GB removal and had EGD, CT scan, and to have colonoscopy typically having loose stools   Past Medical History:  Diagnosis Date  . Depression   . Elevated LFTs   . Gallstones   . HIV disease (HCC)   . 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 Hypertension   . Paranoid schizophrenia (HCC)   . Psychosis (HCC)   . Schizophrenia (HCC)   . Sickle cell trait [facility](HCC)     Past Surgical History:  Procedure Laterality Date  . CHOLECYSTECTOMY N/A 06/22/2016   Procedure: LAPAROSCOPIC CHOLECYSTECTOMY WITH INTRAOPERATIVE CHOLANGIOGRAM;  Surgeon: Ovidio Kinavid Newman, MD;  Location: [facility];  Service: General;  Laterality: N/A;  . ERCP N/A 06/21/2016   Procedure: ENDOSCOPIC RETROGRADE CHOLANGIOPANCREATOGRAPHY (ERCP);  Surgeon: Iva Booparl E Gessner, MD;  [facility];  Service: Endoscopy;  Laterality: N/A;    Family History  Problem Relation Age of Onset  . GER disease Mother   . Hypertension Mother   . Sickle cell anemia Brother   . Diabetes Maternal Aunt   . Diabetes Maternal Grandmother   . Breast cancer Maternal Aunt   . Throat cancer Paternal Grandmother       Social History   Socioeconomic History  . Marital status: Divorced    Spouse name: Not on file  . Number of children: Not on file  . Years of education: Not on  file  . Highest education level: Not on file  Occupational History  . Not on file  Social Needs  . Financial resource strain: Not on file  . Food insecurity:    Worry: Not on file    Inability: Not on file  . Transportation needs:    Medical: Not on file    Non-medical: Not on file  Tobacco Use  . Smoking status: Never Smoker  . Smokeless tobacco: Never Used  Substance and Sexual Activity  . Alcohol use: No  . Drug use: No  . Sexual activity: Yes    Partners: Male    Birth control/protection: IUD  Lifestyle  . Physical activity:    Days per week: Not on file    Minutes per session: Not on file  . Stress: Not on file  Relationships  . Social connections:    Talks on phone: Not on file    Gets together: Not on file    Attends religious service: Not on file    Active member of club or organization: Not on file    Attends meetings of clubs or organizations: Not on file    Relationship status: Not on file  Other Topics Concern  . Not on file  Social History Narrative  . Not on file    Allergies  Allergen Reactions  . Dilaudid [  Hydromorphone Hcl] Itching  . Latex      Current Outpatient Medications:  .  bictegravir-emtricitabine-tenofovir AF (BIKTARVY) 50-200-25 MG TABS tablet, Take 1 tablet by mouth daily., Disp: 30 tablet, Rfl: 11 .  buPROPion (WELLBUTRIN SR) 150 MG 12 hr tablet, Take 1 tablet (150 mg total) by mouth 2 (two) times daily., Disp: 60 tablet, Rfl: 0 .  paliperidone (INVEGA) 3 MG 24 hr tablet, Take 3 mg by mouth daily. , Disp: , Rfl:  .  valACYclovir (VALTREX) 1000 MG tablet, Take 1,000 mg by mouth daily., Disp: , Rfl:  .  benztropine (COGENTIN) 0.5 MG tablet, Take 0.5 mg by mouth 2 (two) times daily., Disp: , Rfl:  .  dicyclomine (BENTYL) 20 MG tablet, Take 1 tablet (20 mg total) by mouth 2 (two) times daily. (Patient not taking: Reported on 08/04/2017), Disp: 20 tablet, Rfl: 0 .  ibuprofen (ADVIL,MOTRIN) 600 MG tablet, Take 1 tablet (600 mg total) by  mouth every 6 (six) hours as needed. (Patient not taking: Reported on 05/12/2017), Disp: 30 tablet, Rfl: 0 .  ondansetron (ZOFRAN ODT) 8 MG disintegrating tablet, 8mg  ODT q8 hours prn nausea (Patient not taking: Reported on 05/12/2017), Disp: 12 tablet, Rfl: 0 .  RisperiDONE (RISPERDAL PO), Take by mouth., Disp: , Rfl:  .  sucralfate (CARAFATE) 1 GM/10ML suspension, Take 10 mLs (1 g total) by mouth 4 (four) times daily -  with meals and at bedtime. (Patient not taking: Reported on 08/04/2017), Disp: 420 mL, Rfl: 0    Review of Systems  Constitutional: Negative for activity change, appetite change, chills, diaphoresis, fatigue, fever and unexpected weight change.  HENT: Negative for congestion, rhinorrhea, sinus pressure, sneezing, sore throat and trouble swallowing.   Eyes: Negative for photophobia and visual disturbance.  Respiratory: Negative for cough, chest tightness, shortness of breath, wheezing and stridor.   Cardiovascular: Negative for chest pain, palpitations and leg swelling.  Gastrointestinal: Positive for diarrhea. Negative for abdominal distention, abdominal pain, anal bleeding, blood in stool, constipation, nausea and vomiting.  Genitourinary: Negative for difficulty urinating, dysuria, flank pain and hematuria.  Musculoskeletal: Negative for arthralgias, back pain, gait problem, joint swelling and myalgias.  Skin: Negative for color change, pallor, rash and wound.  Neurological: Negative for dizziness, tremors, weakness and light-headedness.  Hematological: Negative for adenopathy. Does not bruise/bleed easily.  Psychiatric/Behavioral: Negative for agitation, behavioral problems, confusion, decreased concentration, dysphoric mood and sleep disturbance.       Objective:   Physical Exam  Constitutional: She is oriented to person, place, and time. She appears well-developed and well-nourished. No distress.  HENT:  Head: Normocephalic and atraumatic.  Mouth/Throat: Oropharynx is  clear and moist. No oropharyngeal exudate.  Eyes: Pupils are equal, round, and reactive to light. Conjunctivae and EOM are normal. No scleral icterus.  Neck: Normal range of motion. Neck supple. No JVD present.  Cardiovascular: Normal rate, regular rhythm and normal heart sounds. Exam reveals no gallop and no friction rub.  No murmur heard. Pulmonary/Chest: Effort normal and breath sounds normal. No respiratory distress. She has no wheezes. She has no rales. She exhibits no tenderness.  Abdominal: She exhibits no distension and no mass. There is no tenderness. There is no rebound and no guarding.  Musculoskeletal: She exhibits no edema or tenderness.  Lymphadenopathy:    She has no cervical adenopathy.  Neurological: She is alert and oriented to person, place, and time. She has normal reflexes. She exhibits normal muscle tone. Coordination normal.  Skin: Skin is warm and dry.  She is not diaphoretic. No erythema. No pallor.  Psychiatric: She has a normal mood and affect. Judgment and thought content normal.  Nursing note and vitals reviewed.         Assessment & Plan:  HIV: elite controller. Adriana Fernandez  Likes being on  BIKTARVY. 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

## 2017-08-04 NOTE — Addendum Note (Signed)
Addended by: Gildardo GriffesHILL, Preeya Cleckley M on: 08/04/2017 10:24 AM   Modules accepted: Orders

## 2017-08-05 LAB — LIPID PANEL
Cholesterol: 140 mg/dL (ref <200)
HDL: 40 mg/dL — ABNORMAL LOW (ref >50)
LDL Cholesterol (Calc): 85 mg/dL (calc)
Non-HDL Cholesterol (Calc): 100 mg/dL (calc) (ref <130)
Total CHOL/HDL Ratio: 3.5 (calc) (ref <5.0)
Triglycerides: 65 mg/dL (ref <150)

## 2017-08-05 LAB — RPR: RPR Ser Ql: NONREACTIVE

## 2017-08-05 LAB — CBC WITH DIFFERENTIAL/PLATELET
Basophils Absolute: 32 cells/uL (ref 0–200)
Basophils Relative: 0.6 %
Eosinophils Absolute: 92 cells/uL (ref 15–500)
Eosinophils Relative: 1.7 %
HCT: 37.2 % (ref 35.0–45.0)
Hemoglobin: 12.5 g/dL (ref 11.7–15.5)
Lymphs Abs: 1339 cells/uL (ref 850–3900)
MCH: 29.8 pg (ref 27.0–33.0)
MCHC: 33.6 g/dL (ref 32.0–36.0)
MCV: 88.6 fL (ref 80.0–100.0)
MPV: 9.9 fL (ref 7.5–12.5)
Monocytes Relative: 11.5 %
Neutro Abs: 3316 cells/uL (ref 1500–7800)
Neutrophils Relative %: 61.4 %
Platelets: 247 10*3/uL (ref 140–400)
RBC: 4.2 10*6/uL (ref 3.80–5.10)
RDW: 13.4 % (ref 11.0–15.0)
Total Lymphocyte: 24.8 %
WBC mixed population: 621 cells/uL (ref 200–950)
WBC: 5.4 10*3/uL (ref 3.8–10.8)

## 2017-08-05 LAB — COMPLETE METABOLIC PANEL WITH GFR
AG Ratio: 1.2 (calc) (ref 1.0–2.5)
ALT: 24 U/L (ref 6–29)
AST: 22 U/L (ref 10–30)
Albumin: 3.9 g/dL (ref 3.6–5.1)
Alkaline phosphatase (APISO): 101 U/L (ref 33–115)
BUN: 9 mg/dL (ref 7–25)
CO2: 24 mmol/L (ref 20–32)
Calcium: 9.3 mg/dL (ref 8.6–10.2)
Chloride: 106 mmol/L (ref 98–110)
Creat: 0.85 mg/dL (ref 0.50–1.10)
GFR, Est African American: 97 mL/min/{1.73_m2} (ref >=60)
GFR, Est Non African American: 83 mL/min/{1.73_m2} (ref >=60)
Globulin: 3.2 g/dL (calc) (ref 1.9–3.7)
Glucose, Bld: 89 mg/dL (ref 65–99)
Potassium: 3.8 mmol/L (ref 3.5–5.3)
Sodium: 138 mmol/L (ref 135–146)
Total Bilirubin: 0.3 mg/dL (ref 0.2–1.2)
Total Protein: 7.1 g/dL (ref 6.1–8.1)

## 2017-08-05 LAB — T-HELPER CELL (CD4) - (RCID CLINIC ONLY)
CD4 % Helper T Cell: 42 % (ref 33–55)
CD4 T Cell Abs: 600 /uL (ref 400–2700)

## 2017-08-05 LAB — URINE CYTOLOGY ANCILLARY ONLY
Chlamydia: NEGATIVE
Neisseria Gonorrhea: NEGATIVE

## 2017-08-06 LAB — HIV-1 RNA QUANT-NO REFLEX-BLD
HIV 1 RNA Quant: <20 copies/mL
HIV-1 RNA Quant, Log: <1.3 Log copies/mL

## 2017-08-07 DIAGNOSIS — F331 Major depressive disorder, recurrent, moderate: Secondary | ICD-10-CM | POA: Diagnosis not present

## 2017-09-05 DIAGNOSIS — G43909 Migraine, unspecified, not intractable, without status migrainosus: Secondary | ICD-10-CM | POA: Diagnosis not present

## 2017-09-05 DIAGNOSIS — R7303 Prediabetes: Secondary | ICD-10-CM | POA: Diagnosis not present

## 2017-09-05 DIAGNOSIS — I10 Essential (primary) hypertension: Secondary | ICD-10-CM | POA: Diagnosis not present

## 2017-09-05 DIAGNOSIS — R1013 Epigastric pain: Secondary | ICD-10-CM | POA: Diagnosis not present

## 2017-10-31 DIAGNOSIS — F331 Major depressive disorder, recurrent, moderate: Secondary | ICD-10-CM | POA: Diagnosis not present

## 2017-10-31 DIAGNOSIS — Z79899 Other long term (current) drug therapy: Secondary | ICD-10-CM | POA: Diagnosis not present

## 2017-11-11 DIAGNOSIS — F331 Major depressive disorder, recurrent, moderate: Secondary | ICD-10-CM | POA: Diagnosis not present

## 2017-11-20 DIAGNOSIS — F331 Major depressive disorder, recurrent, moderate: Secondary | ICD-10-CM | POA: Diagnosis not present

## 2017-12-01 DIAGNOSIS — R7303 Prediabetes: Secondary | ICD-10-CM | POA: Diagnosis not present

## 2017-12-01 DIAGNOSIS — R1013 Epigastric pain: Secondary | ICD-10-CM | POA: Diagnosis not present

## 2017-12-01 DIAGNOSIS — G43909 Migraine, unspecified, not intractable, without status migrainosus: Secondary | ICD-10-CM | POA: Diagnosis not present

## 2017-12-01 DIAGNOSIS — I10 Essential (primary) hypertension: Secondary | ICD-10-CM | POA: Diagnosis not present

## 2018-01-02 DIAGNOSIS — I10 Essential (primary) hypertension: Secondary | ICD-10-CM | POA: Diagnosis not present

## 2018-01-02 DIAGNOSIS — F331 Major depressive disorder, recurrent, moderate: Secondary | ICD-10-CM | POA: Diagnosis not present

## 2018-01-02 DIAGNOSIS — G43909 Migraine, unspecified, not intractable, without status migrainosus: Secondary | ICD-10-CM | POA: Diagnosis not present

## 2018-01-02 DIAGNOSIS — R7303 Prediabetes: Secondary | ICD-10-CM | POA: Diagnosis not present

## 2018-01-02 DIAGNOSIS — R1013 Epigastric pain: Secondary | ICD-10-CM | POA: Diagnosis not present

## 2018-01-30 DIAGNOSIS — F331 Major depressive disorder, recurrent, moderate: Secondary | ICD-10-CM | POA: Diagnosis not present

## 2018-02-05 DIAGNOSIS — F331 Major depressive disorder, recurrent, moderate: Secondary | ICD-10-CM | POA: Diagnosis not present

## 2018-02-06 DIAGNOSIS — G43909 Migraine, unspecified, not intractable, without status migrainosus: Secondary | ICD-10-CM | POA: Diagnosis not present

## 2018-02-06 DIAGNOSIS — Z136 Encounter for screening for cardiovascular disorders: Secondary | ICD-10-CM | POA: Diagnosis not present

## 2018-02-06 DIAGNOSIS — I10 Essential (primary) hypertension: Secondary | ICD-10-CM | POA: Diagnosis not present

## 2018-02-06 DIAGNOSIS — R7303 Prediabetes: Secondary | ICD-10-CM | POA: Diagnosis not present

## 2018-02-06 DIAGNOSIS — R1013 Epigastric pain: Secondary | ICD-10-CM | POA: Diagnosis not present

## 2018-02-19 DIAGNOSIS — F331 Major depressive disorder, recurrent, moderate: Secondary | ICD-10-CM | POA: Diagnosis not present

## 2018-03-19 DIAGNOSIS — F331 Major depressive disorder, recurrent, moderate: Secondary | ICD-10-CM | POA: Diagnosis not present

## 2018-03-26 ENCOUNTER — Other Ambulatory Visit: Payer: Self-pay | Admitting: Infectious Disease

## 2018-03-26 DIAGNOSIS — B20 Human immunodeficiency virus [HIV] disease: Secondary | ICD-10-CM

## 2018-04-13 DIAGNOSIS — Z0001 Encounter for general adult medical examination with abnormal findings: Secondary | ICD-10-CM | POA: Diagnosis not present

## 2018-04-13 DIAGNOSIS — R1013 Epigastric pain: Secondary | ICD-10-CM | POA: Diagnosis not present

## 2018-04-13 DIAGNOSIS — Z131 Encounter for screening for diabetes mellitus: Secondary | ICD-10-CM | POA: Diagnosis not present

## 2018-04-13 DIAGNOSIS — Z1329 Encounter for screening for other suspected endocrine disorder: Secondary | ICD-10-CM | POA: Diagnosis not present

## 2018-04-13 DIAGNOSIS — Z01118 Encounter for examination of ears and hearing with other abnormal findings: Secondary | ICD-10-CM | POA: Diagnosis not present

## 2018-04-13 DIAGNOSIS — I10 Essential (primary) hypertension: Secondary | ICD-10-CM | POA: Diagnosis not present

## 2018-04-13 DIAGNOSIS — R7303 Prediabetes: Secondary | ICD-10-CM | POA: Diagnosis not present

## 2018-04-13 DIAGNOSIS — G43909 Migraine, unspecified, not intractable, without status migrainosus: Secondary | ICD-10-CM | POA: Diagnosis not present

## 2018-04-16 DIAGNOSIS — F331 Major depressive disorder, recurrent, moderate: Secondary | ICD-10-CM | POA: Diagnosis not present

## 2018-05-14 DIAGNOSIS — F331 Major depressive disorder, recurrent, moderate: Secondary | ICD-10-CM | POA: Diagnosis not present

## 2018-05-22 DIAGNOSIS — Z711 Person with feared health complaint in whom no diagnosis is made: Secondary | ICD-10-CM | POA: Diagnosis not present

## 2018-05-25 ENCOUNTER — Other Ambulatory Visit: Payer: Self-pay | Admitting: Internal Medicine

## 2018-05-25 DIAGNOSIS — Z1231 Encounter for screening mammogram for malignant neoplasm of breast: Secondary | ICD-10-CM

## 2018-06-24 DIAGNOSIS — F331 Major depressive disorder, recurrent, moderate: Secondary | ICD-10-CM | POA: Diagnosis not present

## 2018-06-29 DIAGNOSIS — R1013 Epigastric pain: Secondary | ICD-10-CM | POA: Diagnosis not present

## 2018-06-29 DIAGNOSIS — R7303 Prediabetes: Secondary | ICD-10-CM | POA: Diagnosis not present

## 2018-06-29 DIAGNOSIS — G43909 Migraine, unspecified, not intractable, without status migrainosus: Secondary | ICD-10-CM | POA: Diagnosis not present

## 2018-06-29 DIAGNOSIS — I10 Essential (primary) hypertension: Secondary | ICD-10-CM | POA: Diagnosis not present

## 2018-07-23 ENCOUNTER — Ambulatory Visit: Payer: BLUE CROSS/BLUE SHIELD

## 2018-08-06 ENCOUNTER — Ambulatory Visit: Payer: BLUE CROSS/BLUE SHIELD | Admitting: Infectious Disease

## 2018-08-21 DIAGNOSIS — Z01110 Encounter for hearing examination following failed hearing screening: Secondary | ICD-10-CM | POA: Diagnosis not present

## 2018-09-01 DIAGNOSIS — F331 Major depressive disorder, recurrent, moderate: Secondary | ICD-10-CM | POA: Diagnosis not present

## 2018-09-17 ENCOUNTER — Telehealth: Payer: Self-pay

## 2018-09-17 NOTE — Telephone Encounter (Signed)
Left voicemail for patient regarding Dr. Lucianne Lei Dam's message. Asked patient to call office back to follow-up on concerns for diarrhea, and to try OTC imodium. [place]

## 2018-09-17 NOTE — Telephone Encounter (Signed)
She can try otc imodium

## 2018-09-17 NOTE — Telephone Encounter (Signed)
Patient called office today complaining about diarrhea. States that this is not a new issue, and that it has been going on for a few years. Patient states she is afraid the diarrhea is related to her HIV. Patient was not able to agree to an appointment at this time due to work schedule. Patient would like to know if Dr. Tommy Medal has any advice on how she can manage diarrhea. Patient has not contacted PCP or GI team. Aundria Rud, [place]

## 2018-09-28 DIAGNOSIS — I10 Essential (primary) hypertension: Secondary | ICD-10-CM | POA: Diagnosis not present

## 2018-09-28 DIAGNOSIS — R7303 Prediabetes: Secondary | ICD-10-CM | POA: Diagnosis not present

## 2018-09-28 DIAGNOSIS — F329 Major depressive disorder, single episode, unspecified: Secondary | ICD-10-CM | POA: Diagnosis not present

## 2018-09-28 DIAGNOSIS — G43909 Migraine, unspecified, not intractable, without status migrainosus: Secondary | ICD-10-CM | POA: Diagnosis not present

## 2018-10-16 DIAGNOSIS — F331 Major depressive disorder, recurrent, moderate: Secondary | ICD-10-CM | POA: Diagnosis not present

## 2018-11-06 ENCOUNTER — Other Ambulatory Visit: Payer: Self-pay | Admitting: Infectious Disease

## 2018-11-06 DIAGNOSIS — Z79899 Other long term (current) drug therapy: Secondary | ICD-10-CM | POA: Diagnosis not present

## 2018-11-06 DIAGNOSIS — B20 Human immunodeficiency virus [HIV] disease: Secondary | ICD-10-CM

## 2018-11-10 ENCOUNTER — Ambulatory Visit (INDEPENDENT_AMBULATORY_CARE_PROVIDER_SITE_OTHER): Payer: BC Managed Care – PPO | Admitting: Infectious Disease

## 2018-11-10 ENCOUNTER — Other Ambulatory Visit: Payer: Self-pay

## 2018-11-10 ENCOUNTER — Encounter: Payer: Self-pay | Admitting: Infectious Disease

## 2018-11-10 VITALS — BP 137/93 | HR 105 | Temp 98.4°F

## 2018-11-10 DIAGNOSIS — R6889 Other general symptoms and signs: Secondary | ICD-10-CM | POA: Diagnosis not present

## 2018-11-10 DIAGNOSIS — B009 Herpesviral infection, unspecified: Secondary | ICD-10-CM

## 2018-11-10 DIAGNOSIS — F200 Paranoid schizophrenia: Secondary | ICD-10-CM

## 2018-11-10 DIAGNOSIS — D573 Sickle-cell trait: Secondary | ICD-10-CM | POA: Diagnosis not present

## 2018-11-10 DIAGNOSIS — Z113 Encounter for screening for infections with a predominantly sexual mode of transmission: Secondary | ICD-10-CM

## 2018-11-10 DIAGNOSIS — Z23 Encounter for immunization: Secondary | ICD-10-CM | POA: Diagnosis not present

## 2018-11-10 DIAGNOSIS — B20 Human immunodeficiency virus [HIV] disease: Secondary | ICD-10-CM | POA: Diagnosis not present

## 2018-11-10 DIAGNOSIS — Z79899 Other long term (current) drug therapy: Secondary | ICD-10-CM | POA: Diagnosis not present

## 2018-11-10 DIAGNOSIS — Z20822 Contact with and (suspected) exposure to covid-19: Secondary | ICD-10-CM

## 2018-11-10 MED ORDER — BIKTARVY 50-200-25 MG PO TABS: 1.0000 | ORAL_TABLET | Freq: Every day | ORAL | 11 refills | Status: DC

## 2018-11-10 NOTE — Addendum Note (Signed)
Addended by: Aundria Rud on: 11/10/2018 10:25 AM   Modules accepted: Orders

## 2018-11-10 NOTE — Progress Notes (Signed)
Chief compliant: followup for HIV on meds (an "elite controller") also complaining of some heartburn symptoms are made better by IBgard   Subjective:    Patient ID: Adriana Fernandez, female    DOB: August 03, 1970, 48 y.o.   MRN: [id number]  HPI  48   year old [place] lady with HIV who is an ELITE controller with undetectable viral load and healthy CD4 count since 1992 when she was diagnosed.   She was enrolled in ACTG trial for this but then could not stay in trial due to drugs she needed for her schizophrenia..  We have subsequently had her on BIKTARVY which she likes very much ans has ZERO side effects.  She has been having GI symptoms post GB removal and had EGD, CT scan, and to have colonoscopy typically having loose stools  Easily she started over-the-counter IBgard and has had nice resolution of her heartburn and her diarrheal symptoms.    Past Medical History:  Diagnosis Date  . Depression   . Elevated LFTs   . Gallstones   . HIV disease (HCC)   . 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 Hypertension   . Paranoid schizophrenia (HCC)   . Psychosis (HCC)   . Schizophrenia (HCC)   . Sickle cell trait [facility](HCC)     Past Surgical History:  Procedure Laterality Date  . CHOLECYSTECTOMY N/A 06/22/2016   Procedure: LAPAROSCOPIC CHOLECYSTECTOMY WITH INTRAOPERATIVE CHOLANGIOGRAM;  Surgeon: Ovidio Kinavid Newman, MD;  Location: [facility];  Service: General;  Laterality: N/A;  . ERCP N/A 06/21/2016   Procedure: ENDOSCOPIC RETROGRADE CHOLANGIOPANCREATOGRAPHY (ERCP);  Surgeon: Iva Booparl E Gessner, MD;  [facility];  Service: Endoscopy;  Laterality: N/A;    Family History  Problem Relation Age of Onset  . GER disease Mother   . Hypertension Mother   . Sickle cell anemia Brother   . Diabetes Maternal Aunt   . Diabetes Maternal Grandmother   . Breast cancer Maternal Aunt   . Throat cancer Paternal Grandmother        Social History   Socioeconomic History  . Marital status: Divorced    Spouse name: Not on file  . Number of children: Not on file  . Years of education: Not on file  . Highest education level: Not on file  Occupational History  . Not on file  Social Needs  . Financial resource strain: Not on file  . Food insecurity    Worry: Not on file    Inability: Not on file  . Transportation needs    Medical: Not on file    Non-medical: Not on file  Tobacco Use  . Smoking status: Never Smoker  . Smokeless tobacco: Never Used  Substance and Sexual Activity  . Alcohol use: No  . Drug use: No  . Sexual activity: Yes    Partners: Male    Birth control/protection: I.U.D.  Lifestyle  . Physical activity    Days per week: Not on file    Minutes per session: Not on file  . Stress: Not on file  Relationships  . Social Musicianconnections    Talks on phone: Not on file    Gets together: Not on file    Attends religious service: Not on file    Active member of club or organization: Not on file    Attends meetings of clubs or organizations: Not on file    Relationship status: Not on file  Other  Topics Concern  . Not on file  Social History Narrative  . Not on file    Allergies  Allergen Reactions  . Dilaudid [Hydromorphone Hcl] Itching  . Latex      Current Outpatient Medications:  .  benztropine (COGENTIN) 0.5 MG tablet, Take 0.5 mg by mouth 2 (two) times daily., Disp: , Rfl:  .  BIKTARVY 50-200-25 MG TABS tablet, TAKE 1 TABLET BY MOUTH DAILY., Disp: 30 tablet, Rfl: 5 .  buPROPion (WELLBUTRIN SR) 150 MG 12 hr tablet, Take 1 tablet (150 mg total) by mouth 2 (two) times daily., Disp: 60 tablet, Rfl: 0 .  dicyclomine (BENTYL) 20 MG tablet, Take 1 tablet (20 mg total) by mouth 2 (two) times daily. (Patient not taking: Reported on 08/04/2017), Disp: 20 tablet, Rfl: 0 .  ibuprofen (ADVIL,MOTRIN) 600 MG tablet, Take 1 tablet (600 mg total) by mouth every 6 (six) hours as needed. (Patient  not taking: Reported on 05/12/2017), Disp: 30 tablet, Rfl: 0 .  ondansetron (ZOFRAN ODT) 8 MG disintegrating tablet, 8mg  ODT q8 hours prn nausea (Patient not taking: Reported on 05/12/2017), Disp: 12 tablet, Rfl: 0 .  paliperidone (INVEGA) 3 MG 24 hr tablet, Take 3 mg by mouth daily. , Disp: , Rfl:  .  RisperiDONE (RISPERDAL PO), Take by mouth., Disp: , Rfl:  .  sucralfate (CARAFATE) 1 GM/10ML suspension, Take 10 mLs (1 g total) by mouth 4 (four) times daily -  with meals and at bedtime. (Patient not taking: Reported on 08/04/2017), Disp: 420 mL, Rfl: 0 .  valACYclovir (VALTREX) 1000 MG tablet, Take 1,000 mg by mouth daily., Disp: , Rfl:     Review of Systems  Constitutional: Negative for activity change, appetite change, chills, diaphoresis, fatigue, fever and unexpected weight change.  HENT: Negative for congestion, rhinorrhea, sinus pressure, sneezing, sore throat and trouble swallowing.   Eyes: Negative for photophobia and visual disturbance.  Respiratory: Negative for cough, chest tightness, shortness of breath, wheezing and stridor.   Cardiovascular: Negative for chest pain, palpitations and leg swelling.  Gastrointestinal: Positive for diarrhea. Negative for abdominal distention, abdominal pain, anal bleeding, blood in stool, constipation, nausea and vomiting.  Genitourinary: Negative for difficulty urinating, dysuria, flank pain and hematuria.  Musculoskeletal: Negative for arthralgias, back pain, gait problem, joint swelling and myalgias.  Skin: Negative for color change, pallor, rash and wound.  Neurological: Negative for dizziness, tremors, weakness and light-headedness.  Hematological: Negative for adenopathy. Does not bruise/bleed easily.  Psychiatric/Behavioral: Negative for agitation, behavioral problems, confusion, decreased concentration, dysphoric mood, hallucinations, self-injury, sleep disturbance and suicidal ideas. The patient is not nervous/anxious and is not hyperactive.         Objective:   Physical Exam  Constitutional: She is oriented to person, place, and time. She appears well-developed and well-nourished. No distress.  HENT:  Head: Normocephalic and atraumatic.  Mouth/Throat: Oropharynx is clear and moist. No oropharyngeal exudate.  Eyes: Pupils are equal, round, and reactive to light. Conjunctivae and EOM are normal. No scleral icterus.  Neck: Normal range of motion. Neck supple. No JVD present.  Cardiovascular: Normal rate, regular rhythm and normal heart sounds. Exam reveals no gallop and no friction rub.  No murmur heard. Pulmonary/Chest: Effort normal and breath sounds normal. No respiratory distress. She has no wheezes. She has no rales. She exhibits no tenderness.  Abdominal: She exhibits no distension and no mass. There is no abdominal tenderness. There is no rebound and no guarding.  Musculoskeletal:  General: No tenderness or edema.  Lymphadenopathy:    She has no cervical adenopathy.  Neurological: She is alert and oriented to person, place, and time. She has normal reflexes. She exhibits normal muscle tone. Coordination normal.  Skin: Skin is warm and dry. She is not diaphoretic. No erythema. No pallor.  Psychiatric: She has a normal mood and affect. Her speech is normal and behavior is normal. Judgment and thought content normal. Cognition and memory are normal.  Nursing note and vitals reviewed.         Assessment & Plan:  HIV: elite controller. [place]  Likes being on  BIKTARVY.  Return to clinic in 1 years time after getting blood work today  Corporate investment banker; well controlled on Invega   HSV1 and 2: on valtrex for prophylaxis  Loose stools : Thereafter IBgard  GERD also better after this medicine

## 2018-11-12 LAB — CBC WITH DIFFERENTIAL/PLATELET
Absolute Monocytes: 763 cells/uL (ref 200–950)
Basophils Absolute: 31 cells/uL (ref 0–200)
Basophils Relative: 0.5 %
Eosinophils Absolute: 61 cells/uL (ref 15–500)
Eosinophils Relative: 1 %
HCT: 37.4 % (ref 35.0–45.0)
Hemoglobin: 12.4 g/dL (ref 11.7–15.5)
Lymphs Abs: 1580 cells/uL (ref 850–3900)
MCH: 29.5 pg (ref 27.0–33.0)
MCHC: 33.2 g/dL (ref 32.0–36.0)
MCV: 89 fL (ref 80.0–100.0)
MPV: 10.2 fL (ref 7.5–12.5)
Monocytes Relative: 12.5 %
Neutro Abs: 3666 cells/uL (ref 1500–7800)
Neutrophils Relative %: 60.1 %
Platelets: 304 10*3/uL (ref 140–400)
RBC: 4.2 10*6/uL (ref 3.80–5.10)
RDW: 13.5 % (ref 11.0–15.0)
Total Lymphocyte: 25.9 %
WBC: 6.1 10*3/uL (ref 3.8–10.8)

## 2018-11-12 LAB — COMPLETE METABOLIC PANEL WITH GFR
AG Ratio: 1.2 (calc) (ref 1.0–2.5)
ALT: 24 U/L (ref 6–29)
AST: 17 U/L (ref 10–35)
Albumin: 4.1 g/dL (ref 3.6–5.1)
Alkaline phosphatase (APISO): 140 U/L — ABNORMAL HIGH (ref 31–125)
BUN: 12 mg/dL (ref 7–25)
CO2: 23 mmol/L (ref 20–32)
Calcium: 9.4 mg/dL (ref 8.6–10.2)
Chloride: 106 mmol/L (ref 98–110)
Creat: 0.91 mg/dL (ref 0.50–1.10)
GFR, Est African American: 88 mL/min/{1.73_m2} (ref >=60)
GFR, Est Non African American: 76 mL/min/{1.73_m2} (ref >=60)
Globulin: 3.4 g/dL (calc) (ref 1.9–3.7)
Glucose, Bld: 92 mg/dL (ref 65–99)
Potassium: 4 mmol/L (ref 3.5–5.3)
Sodium: 137 mmol/L (ref 135–146)
Total Bilirubin: 0.3 mg/dL (ref 0.2–1.2)
Total Protein: 7.5 g/dL (ref 6.1–8.1)

## 2018-11-12 LAB — HELPER T-LYMPH-CD4 (ARMC ONLY)
% CD 4 Pos. Lymph.: 49.1 % (ref 30.8–58.5)
Absolute CD 4 Helper: 835 /uL (ref 359–1519)
Basophils Absolute: 0 10*3/uL (ref 0.0–0.2)
Basos: 1 %
EOS (ABSOLUTE): 0 10*3/uL (ref 0.0–0.4)
Eos: 1 %
Hematocrit: 39.1 % (ref 34.0–46.6)
Hemoglobin: 12.8 g/dL (ref 11.1–15.9)
Immature Grans (Abs): 0 10*3/uL (ref 0.0–0.1)
Immature Granulocytes: 0 %
Lymphocytes Absolute: 1.7 10*3/uL (ref 0.7–3.1)
Lymphs: 27 %
MCH: 29.9 pg (ref 26.6–33.0)
MCHC: 32.7 g/dL (ref 31.5–35.7)
MCV: 91 fL (ref 79–97)
Monocytes Absolute: 0.6 10*3/uL (ref 0.1–0.9)
Monocytes: 10 %
Neutrophils Absolute: 3.7 10*3/uL (ref 1.4–7.0)
Neutrophils: 61 %
Platelets: 311 10*3/uL (ref 150–450)
RBC: 4.28 x10E6/uL (ref 3.77–5.28)
RDW: 13.4 % (ref 11.7–15.4)
WBC: 6.1 10*3/uL (ref 3.4–10.8)

## 2018-11-12 LAB — URINE CYTOLOGY ANCILLARY ONLY
Chlamydia: NEGATIVE
Neisseria Gonorrhea: NEGATIVE

## 2018-11-12 LAB — HIV-1 RNA QUANT-NO REFLEX-BLD
HIV 1 RNA Quant: <20 copies/mL
HIV-1 RNA Quant, Log: <1.3 Log copies/mL

## 2018-11-12 LAB — LIPID PANEL
Cholesterol: 126 mg/dL (ref <200)
HDL: 31 mg/dL — ABNORMAL LOW (ref >=50)
LDL Cholesterol (Calc): 74 mg/dL (calc)
Non-HDL Cholesterol (Calc): 95 mg/dL (calc) (ref <130)
Total CHOL/HDL Ratio: 4.1 (calc) (ref <5.0)
Triglycerides: 133 mg/dL (ref <150)

## 2018-11-12 LAB — NOVEL CORONAVIRUS, NAA: SARS-CoV-2, NAA: NOT DETECTED

## 2018-11-12 LAB — RPR: RPR Ser Ql: NONREACTIVE

## 2018-11-20 DIAGNOSIS — F331 Major depressive disorder, recurrent, moderate: Secondary | ICD-10-CM | POA: Diagnosis not present

## 2018-11-26 ENCOUNTER — Ambulatory Visit
Admission: RE | Admit: 2018-11-26 | Discharge: 2018-11-26 | Disposition: A | Discharge status: Home / Self Care | Payer: BC Managed Care – PPO | Source: Ambulatory Visit | Attending: Internal Medicine | Admitting: Internal Medicine

## 2018-11-26 ENCOUNTER — Other Ambulatory Visit: Payer: Self-pay

## 2018-11-26 DIAGNOSIS — Z1231 Encounter for screening mammogram for malignant neoplasm of breast: Secondary | ICD-10-CM

## 2019-01-01 DIAGNOSIS — G43909 Migraine, unspecified, not intractable, without status migrainosus: Secondary | ICD-10-CM | POA: Diagnosis not present

## 2019-01-01 DIAGNOSIS — E669 Obesity, unspecified: Secondary | ICD-10-CM | POA: Diagnosis not present

## 2019-01-01 DIAGNOSIS — F329 Major depressive disorder, single episode, unspecified: Secondary | ICD-10-CM | POA: Diagnosis not present

## 2019-01-01 DIAGNOSIS — I10 Essential (primary) hypertension: Secondary | ICD-10-CM | POA: Diagnosis not present

## 2019-01-01 DIAGNOSIS — R197 Diarrhea, unspecified: Secondary | ICD-10-CM | POA: Diagnosis not present

## 2019-01-01 DIAGNOSIS — R7303 Prediabetes: Secondary | ICD-10-CM | POA: Diagnosis not present

## 2019-01-01 DIAGNOSIS — K580 Irritable bowel syndrome with diarrhea: Secondary | ICD-10-CM | POA: Diagnosis not present

## 2019-01-06 DIAGNOSIS — R197 Diarrhea, unspecified: Secondary | ICD-10-CM | POA: Diagnosis not present

## 2019-01-20 DIAGNOSIS — F331 Major depressive disorder, recurrent, moderate: Secondary | ICD-10-CM | POA: Diagnosis not present

## 2019-02-23 DIAGNOSIS — Z1211 Encounter for screening for malignant neoplasm of colon: Secondary | ICD-10-CM | POA: Diagnosis not present

## 2019-02-23 DIAGNOSIS — K219 Gastro-esophageal reflux disease without esophagitis: Secondary | ICD-10-CM | POA: Diagnosis not present

## 2019-02-23 DIAGNOSIS — R194 Change in bowel habit: Secondary | ICD-10-CM | POA: Diagnosis not present

## 2019-03-03 ENCOUNTER — Encounter: Payer: Self-pay | Admitting: Infectious Disease

## 2019-03-03 DIAGNOSIS — K5730 Diverticulosis of large intestine without perforation or abscess without bleeding: Secondary | ICD-10-CM | POA: Diagnosis not present

## 2019-03-03 DIAGNOSIS — Z1211 Encounter for screening for malignant neoplasm of colon: Secondary | ICD-10-CM | POA: Diagnosis not present

## 2019-03-09 DIAGNOSIS — R748 Abnormal levels of other serum enzymes: Secondary | ICD-10-CM | POA: Diagnosis not present

## 2019-04-05 DIAGNOSIS — G43909 Migraine, unspecified, not intractable, without status migrainosus: Secondary | ICD-10-CM | POA: Diagnosis not present

## 2019-04-05 DIAGNOSIS — I10 Essential (primary) hypertension: Secondary | ICD-10-CM | POA: Diagnosis not present

## 2019-04-05 DIAGNOSIS — Z131 Encounter for screening for diabetes mellitus: Secondary | ICD-10-CM | POA: Diagnosis not present

## 2019-04-05 DIAGNOSIS — R7303 Prediabetes: Secondary | ICD-10-CM | POA: Diagnosis not present

## 2019-04-05 DIAGNOSIS — Z1329 Encounter for screening for other suspected endocrine disorder: Secondary | ICD-10-CM | POA: Diagnosis not present

## 2019-04-05 DIAGNOSIS — Z0000 Encounter for general adult medical examination without abnormal findings: Secondary | ICD-10-CM | POA: Diagnosis not present

## 2019-04-05 DIAGNOSIS — F329 Major depressive disorder, single episode, unspecified: Secondary | ICD-10-CM | POA: Diagnosis not present

## 2019-04-06 DIAGNOSIS — F209 Schizophrenia, unspecified: Secondary | ICD-10-CM | POA: Diagnosis not present

## 2019-05-04 DIAGNOSIS — F209 Schizophrenia, unspecified: Secondary | ICD-10-CM | POA: Diagnosis not present

## 2019-06-24 DIAGNOSIS — F209 Schizophrenia, unspecified: Secondary | ICD-10-CM | POA: Diagnosis not present

## 2019-07-09 DIAGNOSIS — R1013 Epigastric pain: Secondary | ICD-10-CM | POA: Diagnosis not present

## 2019-07-09 DIAGNOSIS — F329 Major depressive disorder, single episode, unspecified: Secondary | ICD-10-CM | POA: Diagnosis not present

## 2019-07-09 DIAGNOSIS — G43909 Migraine, unspecified, not intractable, without status migrainosus: Secondary | ICD-10-CM | POA: Diagnosis not present

## 2019-07-09 DIAGNOSIS — I10 Essential (primary) hypertension: Secondary | ICD-10-CM | POA: Diagnosis not present

## 2019-08-19 DIAGNOSIS — F209 Schizophrenia, unspecified: Secondary | ICD-10-CM | POA: Diagnosis not present

## 2019-08-26 DIAGNOSIS — R1031 Right lower quadrant pain: Secondary | ICD-10-CM | POA: Diagnosis not present

## 2019-08-26 DIAGNOSIS — R197 Diarrhea, unspecified: Secondary | ICD-10-CM | POA: Diagnosis not present

## 2019-09-16 DIAGNOSIS — F209 Schizophrenia, unspecified: Secondary | ICD-10-CM | POA: Diagnosis not present

## 2019-10-05 ENCOUNTER — Other Ambulatory Visit: Payer: Self-pay | Admitting: Infectious Disease

## 2019-10-05 DIAGNOSIS — B20 Human immunodeficiency virus [HIV] disease: Secondary | ICD-10-CM

## 2019-10-14 IMAGING — CT CT RENAL STONE PROTOCOL
2 of 4 series · 16 of 46 positions shown, 18 images · non-contrast
Comparison: MRCP performed 06/20/2016, and right upper quadrant
ultrasound performed 06/19/2016

CLINICAL DATA: Acute onset of left upper quadrant abdominal pain
and nausea. Hematuria.

EXAM:
CT ABDOMEN AND PELVIS WITHOUT CONTRAST
TECHNIQUE: Multidetector CT imaging of the abdomen and pelvis was performed
following the standard protocol without IV contrast.

[Series 3: axial st · axial · 0.84mm/px · z∈[-476,-26]mm · 13 of 100 slices shown, 15 images]
[im 5/100  soft-tissue]
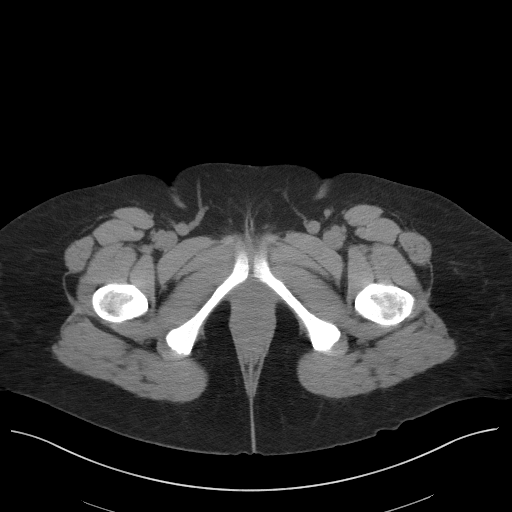
[im 5/100  bone]
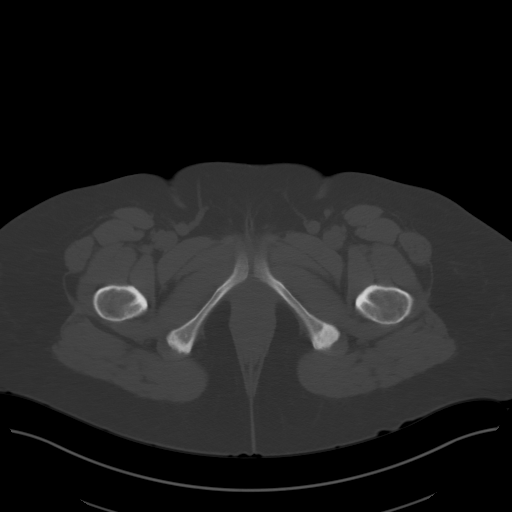
[im 13/100  soft-tissue]
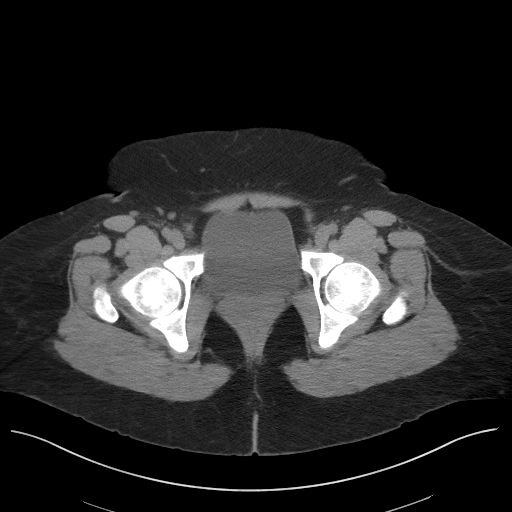
[im 21/100  soft-tissue]
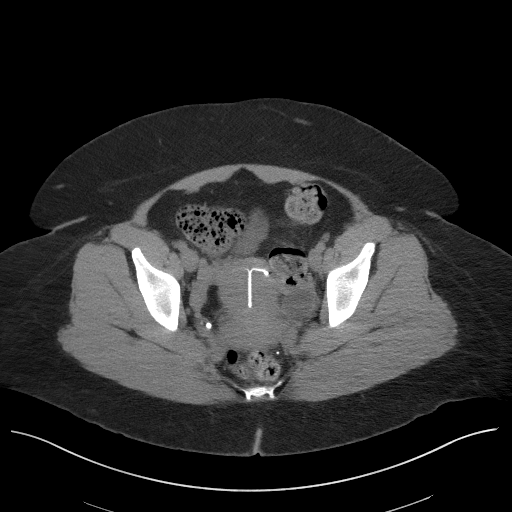
[im 29/100  soft-tissue]
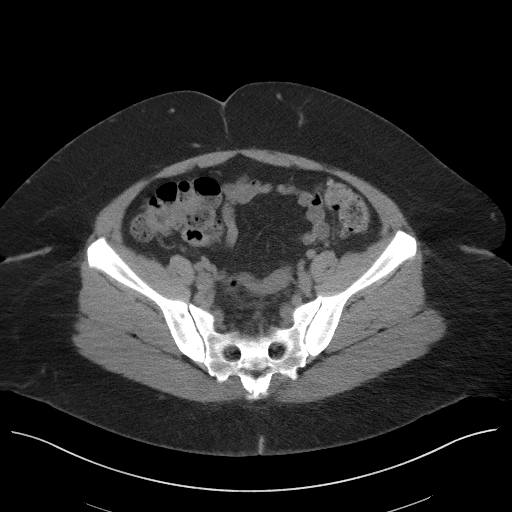
[im 34/100  soft-tissue]
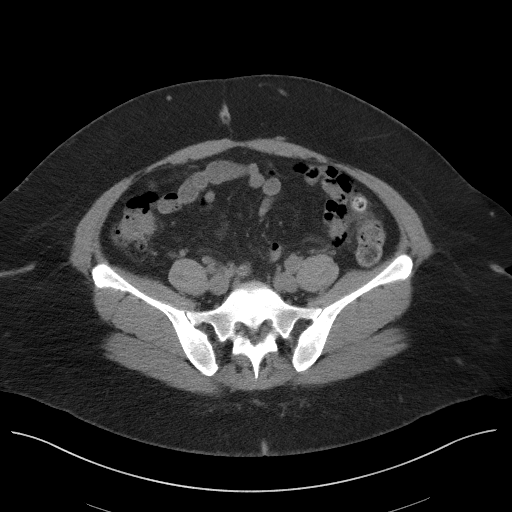
[im 42/100  soft-tissue]
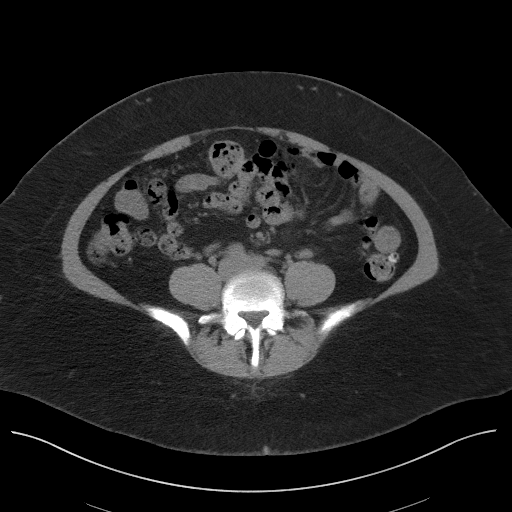
[im 50/100  soft-tissue]
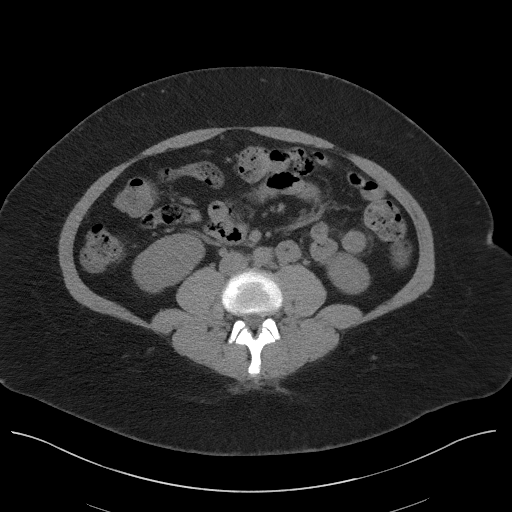
[im 58/100  soft-tissue]
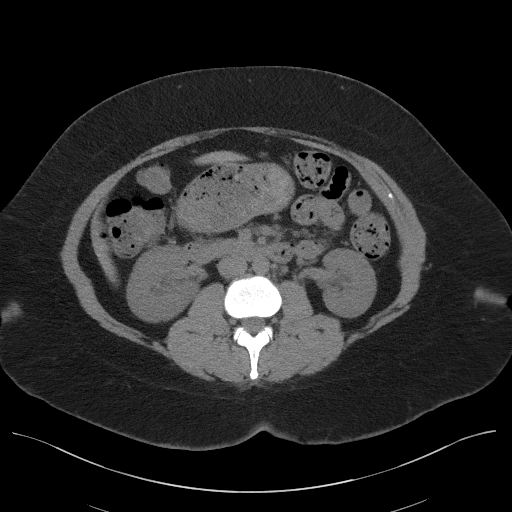
[im 67/100  soft-tissue]
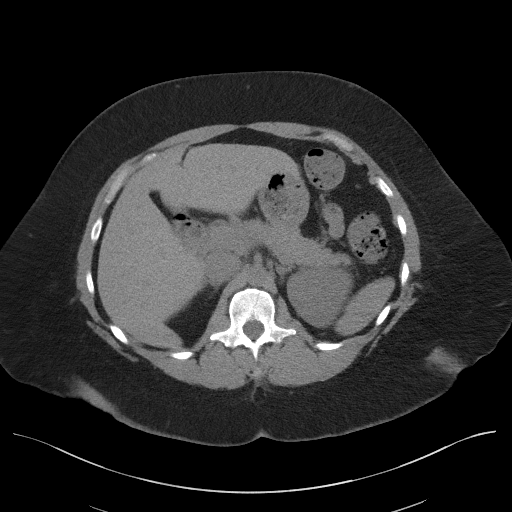
[im 67/100  bone]
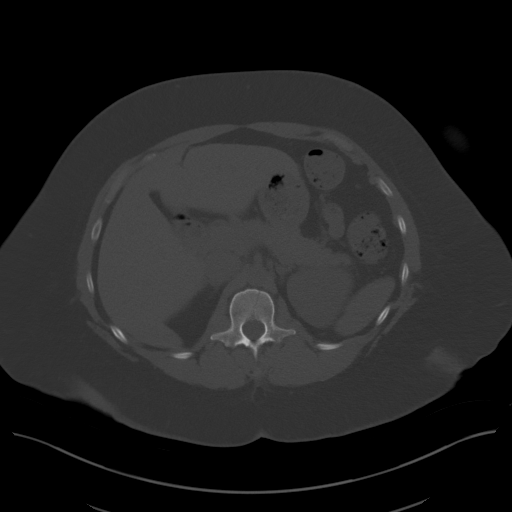
[im 71/100  soft-tissue]
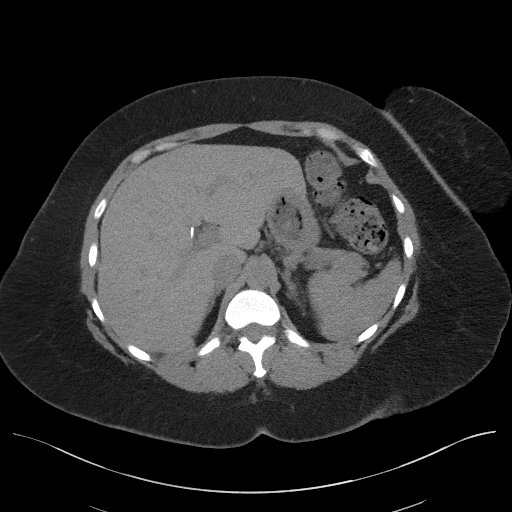
[im 79/100  soft-tissue]
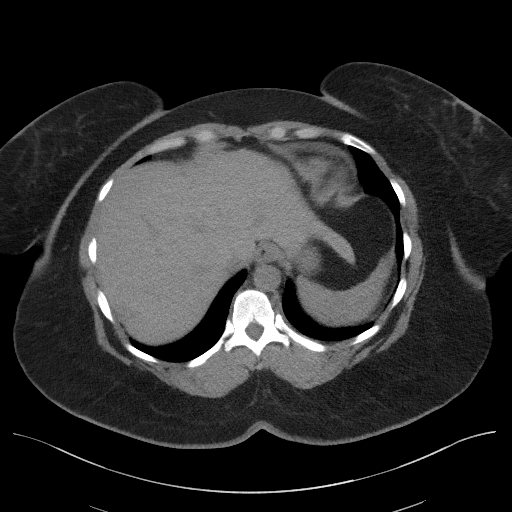
[im 87/100  soft-tissue]
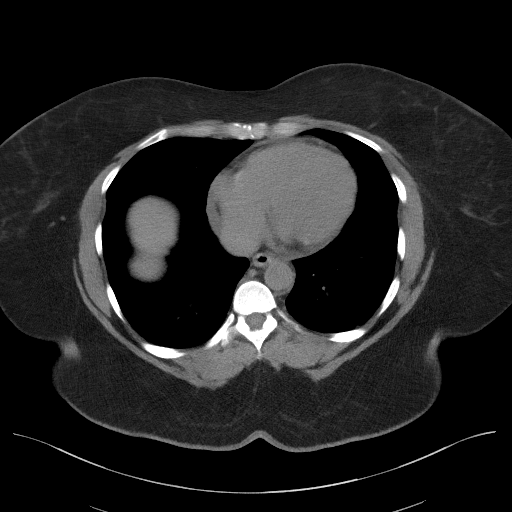
[im 95/100  soft-tissue]
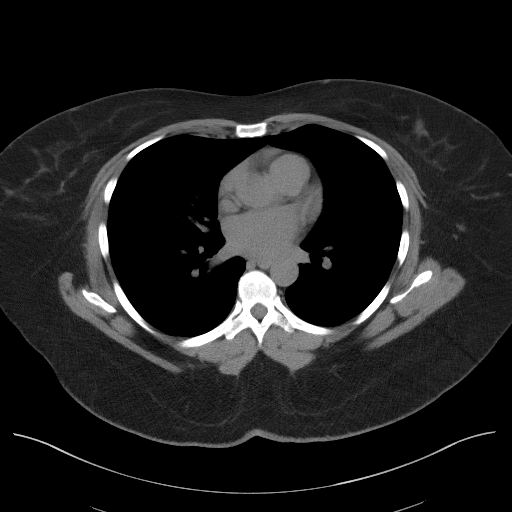

[Series 5: coronal st · coronal · 0.99mm/px · 3 of 94 slices shown]
[im 32/94  soft-tissue]
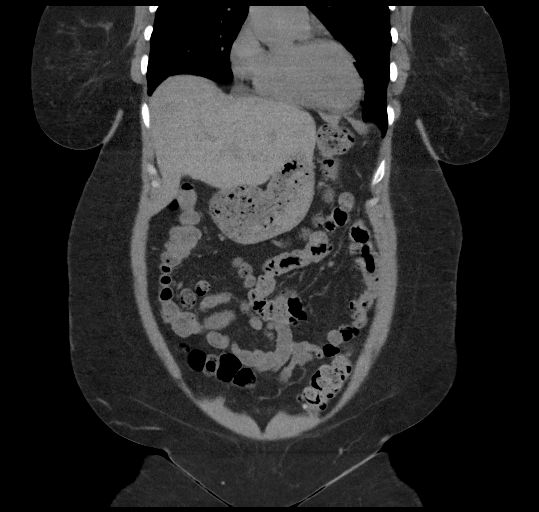
[im 42/94  soft-tissue]
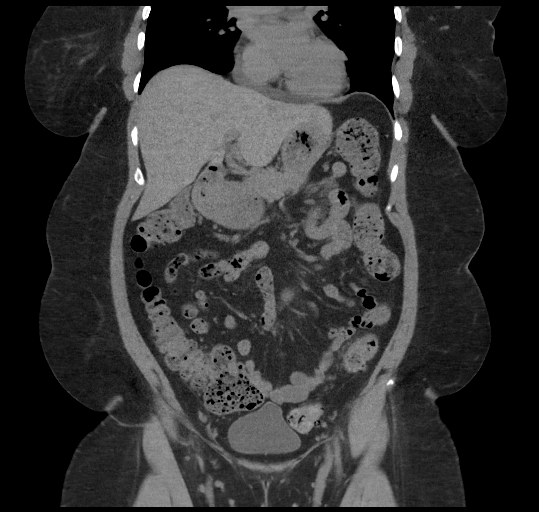
[im 52/94  soft-tissue]
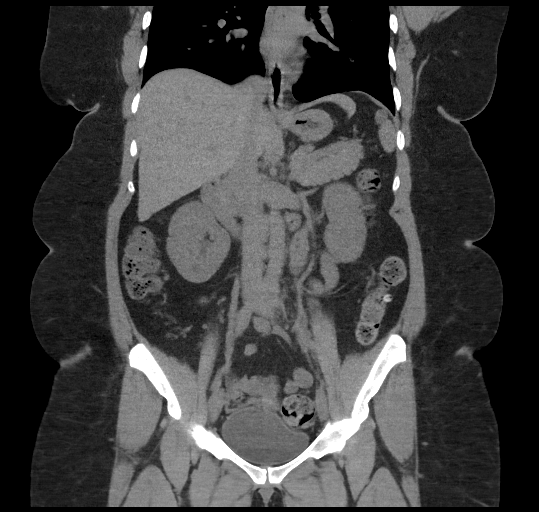

[16 of 46 positions shown; findings below may reference images not displayed]

FINDINGS: Lower chest: The visualized lung bases are grossly clear. The
visualized portions of the mediastinum are unremarkable.

Hepatobiliary: The liver is unremarkable in appearance. The patient
is status post cholecystectomy, with clips noted at the gallbladder
fossa. The common bile duct remains normal in caliber.

Pancreas: The pancreas is within normal limits.

Spleen: The spleen is unremarkable in appearance.

Adrenals/Urinary Tract: The adrenal glands are unremarkable in
appearance. The kidneys are within normal limits. There is no
evidence of hydronephrosis. No renal or ureteral stones are
identified. No perinephric stranding is seen.

Stomach/Bowel: The stomach is unremarkable in appearance. The small
bowel is within normal limits. The appendix is normal in caliber,
without evidence of appendicitis.

Mild scattered diverticulosis is noted along the descending and
proximal sigmoid colon, without evidence of diverticulitis.

Vascular/Lymphatic: The abdominal aorta is unremarkable in
appearance. The inferior vena cava is grossly unremarkable. No
retroperitoneal lymphadenopathy is seen. No pelvic sidewall
lymphadenopathy is identified.

Reproductive: The bladder is mildly distended and grossly
unremarkable. The uterus is unremarkable in appearance, with an
intrauterine device noted in expected position at the fundus of the
uterus. The ovaries are relatively symmetric. No suspicious adnexal
masses are seen.

Other: No additional soft tissue abnormalities are seen.

Musculoskeletal: No acute osseous abnormalities are identified. The
visualized musculature is unremarkable in appearance.
IMPRESSION: 1. No acute abnormality seen within the abdomen or pelvis.
2. Mild scattered diverticulosis along the descending and proximal
sigmoid colon, without evidence of diverticulitis.

## 2019-11-08 ENCOUNTER — Ambulatory Visit: Payer: BC Managed Care – PPO | Admitting: Infectious Disease

## 2019-11-25 DIAGNOSIS — F209 Schizophrenia, unspecified: Secondary | ICD-10-CM | POA: Diagnosis not present

## 2019-12-02 DIAGNOSIS — F209 Schizophrenia, unspecified: Secondary | ICD-10-CM | POA: Diagnosis not present

## 2019-12-09 DIAGNOSIS — F209 Schizophrenia, unspecified: Secondary | ICD-10-CM | POA: Diagnosis not present

## 2019-12-10 ENCOUNTER — Other Ambulatory Visit: Payer: Self-pay | Admitting: Infectious Disease

## 2019-12-10 DIAGNOSIS — B20 Human immunodeficiency virus [HIV] disease: Secondary | ICD-10-CM

## 2019-12-28 DIAGNOSIS — R1013 Epigastric pain: Secondary | ICD-10-CM | POA: Diagnosis not present

## 2019-12-28 DIAGNOSIS — R7303 Prediabetes: Secondary | ICD-10-CM | POA: Diagnosis not present

## 2019-12-28 DIAGNOSIS — I10 Essential (primary) hypertension: Secondary | ICD-10-CM | POA: Diagnosis not present

## 2019-12-28 DIAGNOSIS — E6609 Other obesity due to excess calories: Secondary | ICD-10-CM | POA: Diagnosis not present

## 2019-12-28 DIAGNOSIS — Z23 Encounter for immunization: Secondary | ICD-10-CM | POA: Diagnosis not present

## 2020-01-11 ENCOUNTER — Other Ambulatory Visit (HOSPITAL_COMMUNITY)
Admission: RE | Admit: 2020-01-11 | Discharge: 2020-01-11 | Disposition: A | Discharge status: Home / Self Care | Payer: BC Managed Care – PPO | Source: Ambulatory Visit | Attending: Infectious Disease | Admitting: Infectious Disease

## 2020-01-11 ENCOUNTER — Other Ambulatory Visit: Payer: Self-pay

## 2020-01-11 ENCOUNTER — Ambulatory Visit: Payer: BC Managed Care – PPO | Admitting: Infectious Disease

## 2020-01-11 ENCOUNTER — Encounter: Payer: Self-pay | Admitting: Infectious Disease

## 2020-01-11 VITALS — BP 109/75 | HR 80 | Temp 98.4°F | Wt 203.4 lb

## 2020-01-11 DIAGNOSIS — F200 Paranoid schizophrenia: Secondary | ICD-10-CM | POA: Diagnosis not present

## 2020-01-11 DIAGNOSIS — B009 Herpesviral infection, unspecified: Secondary | ICD-10-CM | POA: Diagnosis not present

## 2020-01-11 DIAGNOSIS — B20 Human immunodeficiency virus [HIV] disease: Secondary | ICD-10-CM | POA: Diagnosis not present

## 2020-01-11 DIAGNOSIS — D573 Sickle-cell trait: Secondary | ICD-10-CM | POA: Diagnosis not present

## 2020-01-11 LAB — CBC WITH DIFFERENTIAL/PLATELET
Basophils Relative: 0.5 %
Lymphs Abs: 1586 cells/uL (ref 850–3900)
Neutrophils Relative %: 63 %

## 2020-01-11 MED ORDER — BIKTARVY 50-200-25 MG PO TABS: 1.0000 | ORAL_TABLET | Freq: Every day | ORAL | 11 refills | Status: DC

## 2020-01-11 NOTE — Progress Notes (Signed)
Chief compliant: followup for HIV on meds (an "elite controller")   Subjective:    Patient ID: Adriana Fernandez, female    DOB: November 22, 1970, 49 y.o.   MRN: [id number]  HPI  49   year old [place] American lady with HIV who is an ELITE controller with undetectable viral load and healthy CD4 count since 1992 when she was diagnosed.   She was enrolled in ACTG trial for this but then could not stay in trial due to drugs she needed for her schizophrenia..  We have subsequently had her on BIKTARVY   Likes this medication quite a lot.  I did discuss with her the idea of potentially having her on less drugs since he is in late control anyway and switching her to [facility] but she would prefer to stay on Biktarvy for now.  She has had to Pfizer dose is the most recent one being in April and I offered her a third dose of Pfizer today but she declined being apprehensive about side effects from the vaccine since she had flulike symptoms after her second dose.  I have encouraged her still to get a third dose but she will think about it for now.    Past Medical History:  Diagnosis Date  . Depression   . Elevated LFTs   . Gallstones   . HIV disease (HCC)   . 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 Hypertension   . Paranoid schizophrenia (HCC)   . Psychosis (HCC)   . Schizophrenia (HCC)   . Sickle cell trait [facility])     Past Surgical History:  Procedure Laterality Date  . CHOLECYSTECTOMY N/A 06/22/2016   Procedure: LAPAROSCOPIC CHOLECYSTECTOMY WITH INTRAOPERATIVE CHOLANGIOGRAM;  Surgeon: Ovidio Kin, MD;  Location: [facility];  Service: General;  Laterality: N/A;  . ERCP N/A 06/21/2016   Procedure: ENDOSCOPIC RETROGRADE CHOLANGIOPANCREATOGRAPHY (ERCP);  Surgeon: Iva Boop, MD;  Location: [facility];  Service: Endoscopy;  Laterality: N/A;    Family History  Problem Relation Age of Onset  . GER disease Mother   .  Hypertension Mother   . Sickle cell anemia Brother   . Diabetes Maternal Aunt   . Diabetes Maternal Grandmother   . Breast cancer Maternal Aunt   . Throat cancer Paternal Grandmother       Social History   Socioeconomic History  . Marital status: Divorced    Spouse name: Not on file  . Number of children: Not on file  . Years of education: Not on file  . Highest education level: Not on file  Occupational History  . Not on file  Tobacco Use  . Smoking status: Never Smoker  . Smokeless tobacco: Never Used  Substance and Sexual Activity  . Alcohol use: No  . Drug use: No  . Sexual activity: Yes    Partners: Male    Birth control/protection: I.U.D.  Other Topics Concern  . Not on file  Social History Narrative  . Not on file   Social Determinants of Health   Financial Resource Strain:   . Difficulty of Paying Living Expenses: Not on file  Food Insecurity:   . Worried About Programme researcher, broadcasting/film/video in the Last Year: Not on file  . Ran Out of Food in the Last Year: Not on file  Transportation Needs:   . Lack of Transportation (Medical): Not on file  . Lack of Transportation (Non-Medical): Not on file  Physical  Activity:   . Days of Exercise per Week: Not on file  . Minutes of Exercise per Session: Not on file  Stress:   . Feeling of Stress : Not on file  Social Connections:   . Frequency of Communication with Friends and Family: Not on file  . Frequency of Social Gatherings with Friends and Family: Not on file  . Attends Religious Services: Not on file  . Active Member of Clubs or Organizations: Not on file  . Attends Banker Meetings: Not on file  . Marital Status: Not on file    Allergies  Allergen Reactions  . Dilaudid [Hydromorphone Hcl] Itching  . Latex      Current Outpatient Medications:  .  benztropine (COGENTIN) 0.5 MG tablet, Take 0.5 mg by mouth 2 (two) times daily., Disp: , Rfl:  .  BIKTARVY 50-200-25 MG TABS tablet, TAKE 1 TABLET BY  MOUTH DAILY., Disp: 30 tablet, Rfl: 2 .  buPROPion (WELLBUTRIN SR) 150 MG 12 hr tablet, Take 1 tablet (150 mg total) by mouth 2 (two) times daily., Disp: 60 tablet, Rfl: 0 .  dicyclomine (BENTYL) 20 MG tablet, Take 1 tablet (20 mg total) by mouth 2 (two) times daily. (Patient not taking: Reported on 08/04/2017), Disp: 20 tablet, Rfl: 0 .  esomeprazole (NEXIUM) 40 MG capsule, Take 40 mg by mouth 2 (two) times daily., Disp: , Rfl:  .  ibuprofen (ADVIL,MOTRIN) 600 MG tablet, Take 1 tablet (600 mg total) by mouth every 6 (six) hours as needed. (Patient not taking: Reported on 05/12/2017), Disp: 30 tablet, Rfl: 0 .  LATUDA 20 MG TABS tablet, TAKE 1 TABLET BY MOUTH AT BEDTIME WITY H350 CALORIES OF FOOD DO NOT DRIVE OR OPERATE MACHINERY. DO NOT USE ALCOHOL, Disp: , Rfl:  .  ondansetron (ZOFRAN ODT) 8 MG disintegrating tablet, 8mg  ODT q8 hours prn nausea (Patient not taking: Reported on 05/12/2017), Disp: 12 tablet, Rfl: 0 .  paliperidone (INVEGA) 3 MG 24 hr tablet, Take 3 mg by mouth daily. , Disp: , Rfl:  .  phentermine (ADIPEX-P) 37.5 MG tablet, TAKE 1 2 (ONE HALF) TABLET BY MOUTH ONCE DAILY, Disp: , Rfl:  .  RisperiDONE (RISPERDAL PO), Take by mouth., Disp: , Rfl:  .  sucralfate (CARAFATE) 1 GM/10ML suspension, Take 10 mLs (1 g total) by mouth 4 (four) times daily -  with meals and at bedtime., Disp: 420 mL, Rfl: 0 .  valACYclovir (VALTREX) 1000 MG tablet, Take 1,000 mg by mouth daily., Disp: , Rfl:     Review of Systems  Constitutional: Negative for activity change, appetite change, chills, diaphoresis, fatigue, fever and unexpected weight change.  HENT: Negative for congestion, rhinorrhea, sinus pressure, sneezing, sore throat and trouble swallowing.   Eyes: Negative for photophobia and visual disturbance.  Respiratory: Negative for cough, chest tightness, shortness of breath, wheezing and stridor.   Cardiovascular: Negative for chest pain, palpitations and leg swelling.  Gastrointestinal: Negative  for abdominal distention, abdominal pain, anal bleeding, blood in stool, constipation, diarrhea, nausea and vomiting.  Genitourinary: Negative for difficulty urinating, dysuria, flank pain and hematuria.  Musculoskeletal: Negative for arthralgias, back pain, gait problem, joint swelling and myalgias.  Skin: Negative for color change, pallor, rash and wound.  Neurological: Negative for dizziness, tremors, weakness and light-headedness.  Hematological: Negative for adenopathy. Does not bruise/bleed easily.  Psychiatric/Behavioral: Negative for agitation, behavioral problems, confusion, decreased concentration, dysphoric mood, hallucinations, self-injury, sleep disturbance and suicidal ideas. The patient is not nervous/anxious and is not hyperactive.  Objective:   Physical Exam Vitals and nursing note reviewed.  Constitutional:      General: She is not in acute distress.    Appearance: She is well-developed. She is not diaphoretic.  HENT:     Head: Normocephalic and atraumatic.     Mouth/Throat:     Pharynx: No oropharyngeal exudate.  Eyes:     General: No scleral icterus.    Conjunctiva/sclera: Conjunctivae normal.     Pupils: Pupils are equal, round, and reactive to light.  Neck:     Vascular: No JVD.  Cardiovascular:     Rate and Rhythm: Normal rate and regular rhythm.  Pulmonary:     Effort: Pulmonary effort is normal. No respiratory distress.     Breath sounds: No wheezing.  Chest:     Chest wall: No tenderness.  Abdominal:     General: There is no distension.  Musculoskeletal:        General: No tenderness.     Cervical back: Normal range of motion and neck supple.  Lymphadenopathy:     Cervical: No cervical adenopathy.  Skin:    General: Skin is warm and dry.     Coloration: Skin is not pale.     Findings: No erythema.  Neurological:     General: No focal deficit present.     Mental Status: She is alert and oriented to person, place, and time.     Motor: No  abnormal muscle tone.     Coordination: Coordination normal.     Deep Tendon Reflexes: Reflexes are normal and symmetric.  Psychiatric:        Mood and Affect: Mood normal.        Speech: Speech normal.        Behavior: Behavior normal.        Thought Content: Thought content normal.        Judgment: Judgment normal.           Assessment & Plan:  HIV: elite controller. [place]  Likes being on  BIKTARVY.  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

## 2020-01-12 ENCOUNTER — Other Ambulatory Visit: Payer: Self-pay | Admitting: Internal Medicine

## 2020-01-12 DIAGNOSIS — Z1231 Encounter for screening mammogram for malignant neoplasm of breast: Secondary | ICD-10-CM

## 2020-01-12 LAB — URINE CYTOLOGY ANCILLARY ONLY
Chlamydia: NEGATIVE
Comment: NEGATIVE
Comment: NORMAL
Neisseria Gonorrhea: NEGATIVE

## 2020-01-12 LAB — T-HELPER CELL (CD4) - (RCID CLINIC ONLY)
CD4 % Helper T Cell: 48 % (ref 33–65)
CD4 T Cell Abs: 788 /uL (ref 400–1790)

## 2020-01-12 LAB — RPR: RPR Ser Ql: NONREACTIVE

## 2020-01-12 LAB — COMPLETE METABOLIC PANEL WITH GFR
Chloride: 100 mmol/L (ref 98–110)
Creat: 1.01 mg/dL (ref 0.50–1.10)
Potassium: 3.4 mmol/L — ABNORMAL LOW (ref 3.5–5.3)

## 2020-01-13 ENCOUNTER — Ambulatory Visit: Payer: BC Managed Care – PPO

## 2020-01-13 LAB — COMPLETE METABOLIC PANEL WITH GFR
AG Ratio: 1.4 (calc) (ref 1.0–2.5)
ALT: 19 U/L (ref 6–29)
AST: 14 U/L (ref 10–35)
Albumin: 4.5 g/dL (ref 3.6–5.1)
Alkaline phosphatase (APISO): 97 U/L (ref 31–125)
BUN: 17 mg/dL (ref 7–25)
CO2: 25 mmol/L (ref 20–32)
Calcium: 10 mg/dL (ref 8.6–10.2)
GFR, Est African American: 77 mL/min/{1.73_m2} (ref >=60)
GFR, Est Non African American: 66 mL/min/{1.73_m2} (ref >=60)
Globulin: 3.3 g/dL (calc) (ref 1.9–3.7)
Glucose, Bld: 111 mg/dL — ABNORMAL HIGH (ref 65–99)
Sodium: 137 mmol/L (ref 135–146)
Total Bilirubin: 0.3 mg/dL (ref 0.2–1.2)
Total Protein: 7.8 g/dL (ref 6.1–8.1)

## 2020-01-13 LAB — CBC WITH DIFFERENTIAL/PLATELET
Absolute Monocytes: 728 cells/uL (ref 200–950)
Basophils Absolute: 33 cells/uL (ref 0–200)
Eosinophils Absolute: 59 cells/uL (ref 15–500)
Eosinophils Relative: 0.9 %
HCT: 42.2 % (ref 35.0–45.0)
Hemoglobin: 13.9 g/dL (ref 11.7–15.5)
MCH: 30.1 pg (ref 27.0–33.0)
MCHC: 32.9 g/dL (ref 32.0–36.0)
MCV: 91.3 fL (ref 80.0–100.0)
MPV: 10.7 fL (ref 7.5–12.5)
Monocytes Relative: 11.2 %
Neutro Abs: 4095 cells/uL (ref 1500–7800)
Platelets: 327 10*3/uL (ref 140–400)
RBC: 4.62 10*6/uL (ref 3.80–5.10)
RDW: 13.9 % (ref 11.0–15.0)
Total Lymphocyte: 24.4 %
WBC: 6.5 10*3/uL (ref 3.8–10.8)

## 2020-01-13 LAB — HIV-1 RNA QUANT-NO REFLEX-BLD
HIV 1 RNA Quant: <20 Copies/mL
HIV-1 RNA Quant, Log: <1.3 Log cps/mL

## 2020-01-13 LAB — LIPID PANEL
Cholesterol: 138 mg/dL (ref <200)
HDL: 34 mg/dL — ABNORMAL LOW (ref >=50)
LDL Cholesterol (Calc): 75 mg/dL (calc)
Non-HDL Cholesterol (Calc): 104 mg/dL (calc) (ref <130)
Total CHOL/HDL Ratio: 4.1 (calc) (ref <5.0)
Triglycerides: 203 mg/dL — ABNORMAL HIGH (ref <150)

## 2020-03-01 ENCOUNTER — Ambulatory Visit: Payer: BC Managed Care – PPO

## 2020-03-23 DIAGNOSIS — F200 Paranoid schizophrenia: Secondary | ICD-10-CM | POA: Diagnosis not present

## 2020-03-28 DIAGNOSIS — F200 Paranoid schizophrenia: Secondary | ICD-10-CM | POA: Diagnosis not present

## 2020-03-29 ENCOUNTER — Ambulatory Visit: Payer: BC Managed Care – PPO

## 2020-04-03 DIAGNOSIS — I10 Essential (primary) hypertension: Secondary | ICD-10-CM | POA: Diagnosis not present

## 2020-04-03 DIAGNOSIS — Z136 Encounter for screening for cardiovascular disorders: Secondary | ICD-10-CM | POA: Diagnosis not present

## 2020-04-03 DIAGNOSIS — R7303 Prediabetes: Secondary | ICD-10-CM | POA: Diagnosis not present

## 2020-04-03 DIAGNOSIS — Z1329 Encounter for screening for other suspected endocrine disorder: Secondary | ICD-10-CM | POA: Diagnosis not present

## 2020-04-03 DIAGNOSIS — R1013 Epigastric pain: Secondary | ICD-10-CM | POA: Diagnosis not present

## 2020-04-03 DIAGNOSIS — Z131 Encounter for screening for diabetes mellitus: Secondary | ICD-10-CM | POA: Diagnosis not present

## 2020-04-03 DIAGNOSIS — E6609 Other obesity due to excess calories: Secondary | ICD-10-CM | POA: Diagnosis not present

## 2020-04-03 DIAGNOSIS — Z0001 Encounter for general adult medical examination with abnormal findings: Secondary | ICD-10-CM | POA: Diagnosis not present

## 2020-04-12 DIAGNOSIS — F200 Paranoid schizophrenia: Secondary | ICD-10-CM | POA: Diagnosis not present

## 2020-05-02 ENCOUNTER — Other Ambulatory Visit: Payer: Self-pay

## 2020-05-02 ENCOUNTER — Ambulatory Visit (HOSPITAL_COMMUNITY)
Admission: EM | Admit: 2020-05-02 | Discharge: 2020-05-03 | Disposition: A | Discharge status: Home / Self Care | Payer: BC Managed Care – PPO

## 2020-05-02 DIAGNOSIS — F200 Paranoid schizophrenia: Secondary | ICD-10-CM | POA: Diagnosis not present

## 2020-05-02 NOTE — ED Notes (Signed)
Pt verbalized understanding of DC instructions, Left [facility] A&O x 4, no distress noted.

## 2020-05-02 NOTE — Discharge Instructions (Signed)

## 2020-05-03 DIAGNOSIS — F200 Paranoid schizophrenia: Secondary | ICD-10-CM | POA: Diagnosis not present
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
## 2020-05-11 DIAGNOSIS — F200 Paranoid schizophrenia: Secondary | ICD-10-CM | POA: Diagnosis not present

## 2020-05-15 DIAGNOSIS — I10 Essential (primary) hypertension: Secondary | ICD-10-CM | POA: Diagnosis not present

## 2020-05-15 DIAGNOSIS — R7303 Prediabetes: Secondary | ICD-10-CM | POA: Diagnosis not present

## 2020-05-15 DIAGNOSIS — R1013 Epigastric pain: Secondary | ICD-10-CM | POA: Diagnosis not present

## 2020-05-15 DIAGNOSIS — G43009 Migraine without aura, not intractable, without status migrainosus: Secondary | ICD-10-CM | POA: Diagnosis not present

## 2020-06-08 DIAGNOSIS — F333 Major depressive disorder, recurrent, severe with psychotic symptoms: Secondary | ICD-10-CM | POA: Diagnosis not present

## 2020-06-15 ENCOUNTER — Encounter (HOSPITAL_COMMUNITY): Payer: Self-pay | Admitting: Psychiatry

## 2020-06-15 ENCOUNTER — Other Ambulatory Visit: Payer: Self-pay

## 2020-06-15 ENCOUNTER — Telehealth (INDEPENDENT_AMBULATORY_CARE_PROVIDER_SITE_OTHER): Payer: BC Managed Care – PPO | Admitting: Psychiatry

## 2020-06-15 DIAGNOSIS — F200 Paranoid schizophrenia: Secondary | ICD-10-CM
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
## 2020-06-19 ENCOUNTER — Ambulatory Visit (INDEPENDENT_AMBULATORY_CARE_PROVIDER_SITE_OTHER): Payer: BC Managed Care – PPO | Admitting: Licensed Clinical Social Worker

## 2020-06-19 DIAGNOSIS — F200 Paranoid schizophrenia: Secondary | ICD-10-CM | POA: Diagnosis not present
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
## 2020-07-26 DIAGNOSIS — I10 Essential (primary) hypertension: Secondary | ICD-10-CM | POA: Diagnosis not present

## 2020-07-26 DIAGNOSIS — R7303 Prediabetes: Secondary | ICD-10-CM | POA: Diagnosis not present

## 2020-07-26 DIAGNOSIS — N289 Disorder of kidney and ureter, unspecified: Secondary | ICD-10-CM | POA: Diagnosis not present

## 2020-07-26 DIAGNOSIS — R1013 Epigastric pain: Secondary | ICD-10-CM | POA: Diagnosis not present

## 2020-07-27 ENCOUNTER — Ambulatory Visit (INDEPENDENT_AMBULATORY_CARE_PROVIDER_SITE_OTHER): Payer: BC Managed Care – PPO | Admitting: Licensed Clinical Social Worker

## 2020-07-27 DIAGNOSIS — F200 Paranoid schizophrenia: Secondary | ICD-10-CM

## 2020-07-27 NOTE — Progress Notes (Addendum)
Virtual Visit via Video Note  I connected with Adriana Fernandez on 07/27/20 at  9:00 AM EDT by a video enabled telemedicine application and verified that I am speaking with the correct person using two identifiers.  Location: Patient: home Provider: home office   I discussed the limitations of evaluation and management by telemedicine and the availability of in person appointments. The patient expressed understanding and agreed to proceed.  I discussed the assessment and treatment plan with the patient. The patient was provided an opportunity to ask questions and all were answered. The patient agreed with the plan and demonstrated an understanding of the instructions.   The patient was advised to call back or seek an in-person evaluation if the symptoms worsen or if the condition fails to improve as anticipated.  I provided 55 minutes of non-face-to-face time during this encounter.  THERAPIST PROGRESS NOTE  Session Time: 9:00 AM to 9:55 AM  Participation Level: Active  Behavioral Response: CasualAlertAnxious  Type of Therapy: Individual Therapy  Treatment Goals addressed:  Stress management, managing symptoms, self-esteem as needed, coping  Interventions: CBT, Solution Focused, Strength-based, Supportive and Other: coping  Summary: Adriana Fernandez is a 50 y.o. female who presents with still hearing the voices but ok. A little anxiety because going through the divorce she has paid the attorney and have to wait for the process of finalizing the divorce. Patient say has to patience with the process.  When discussing the future projecting to the future were making it up as it has not happened and she relates and understands it is like has paranoia thoughts made up. Voices tune out because negative bury them on something positive it is constantly feeding negative thoughts that are not good so she tries to tune it out. If doesn't fill mind with positive thoughts mind out of wack.  Medication helps but when don't sleep get cloudy and needs to have clear thoughts. Sleep impacted stay up until 5 AM and needs sleep. Identifies obsessive thoughts as part of the reason. Also she reads, looks at phone, Youtube.  Discussed recommendation of turning off these lights as they can keep you awake as well as strategies to slow down as these activities can keep her mind going as well. When no sleep money collecting at [place] can get agitated need rest so not agitated and right attitude.  Patient provided more information that was very helpful clinically in completing treatment plan. Client pointed out helpfulness of therapy of getting things out helping her to deal with different things.  Therapist reviewed symptoms, facilitated expression of thoughts and feelings worked on completing assessment still have pain and nutrition assessment as well as treatment plan to complete.  Work with patient on her anxiety introduced worksheet "hypothetical worry versus real problem" for patient to focus on what she can do, list her options and something she can do right now and then let worry go helping her in problem solving rather than unhelpful worry.  Also talked about anxiety as negative forecasting predicting the worse and challenging this thing we do not know what is going to happen so that is merely thoughts in her head and not real, helpful to stay in the present and not be consumed with anxiety and cognitive distortions that are not based on anything happening yet.  Processed through stressors going through a divorce noting not in her control more than likely will go smoothly, letting go of things that we cannot control and having patients with  the process is helpful coping.  Worked on sleep hygiene, also thought a good idea for patient to ask doctor about medications.  Therapist and reviewing treatment plan noted low self-esteem from voices, reluctance to have higher self-esteem as the voices will be  negative, will work with patient on these voices to help her in improving self-esteem and being more comfortable around people if possible or finding coping skills that will work for patient.  Therapist provided active listening, open questions, supportive interventions. Suicidal/Homicidal: No  Plan: Return again in  4 weeks.2.  Finish treatment plan and finish assessments, work with patient on coping with negative voices, her social anxiety, stress management  Diagnosis: Axis I:  paranoid schizophrenia    Axis II: No diagnosis    Coolidge Breeze, LCSW 07/27/2020

## 2020-08-08 DIAGNOSIS — F333 Major depressive disorder, recurrent, severe with psychotic symptoms: Secondary | ICD-10-CM | POA: Diagnosis not present

## 2020-08-24 ENCOUNTER — Ambulatory Visit (HOSPITAL_COMMUNITY): Payer: BC Managed Care – PPO | Admitting: Licensed Clinical Social Worker

## 2020-09-12 ENCOUNTER — Telehealth: Payer: Self-pay

## 2020-09-12 NOTE — Telephone Encounter (Signed)
Patient called wanting to talk to a nurse or Dr. Daiva Eves to ask if medication Biktarvy may cause diarrhea. Said she did have her gallbladder removed back in 2018 and has had this issue since then but just wants to make sure this medication isn't another cause for it. Best contact number is [phone]

## 2020-09-14 ENCOUNTER — Ambulatory Visit (HOSPITAL_COMMUNITY): Payer: BC Managed Care – PPO | Admitting: Licensed Clinical Social Worker

## 2020-09-14 DIAGNOSIS — T8332XA Displacement of intrauterine contraceptive device, initial encounter: Secondary | ICD-10-CM | POA: Diagnosis not present

## 2020-09-14 DIAGNOSIS — R87618 Other abnormal cytological findings on specimens from cervix uteri: Secondary | ICD-10-CM | POA: Diagnosis not present

## 2020-09-14 DIAGNOSIS — B20 Human immunodeficiency virus [HIV] disease: Secondary | ICD-10-CM | POA: Diagnosis not present

## 2020-09-15 NOTE — Telephone Encounter (Signed)
Attempted to call patient and discuss. No answer. It appears that she needs an appointment with Dr. Daiva Eves  to discuss her medication status. If she calls back, please schedule with him. Thank you!

## 2020-09-27 DIAGNOSIS — R87618 Other abnormal cytological findings on specimens from cervix uteri: Secondary | ICD-10-CM | POA: Insufficient documentation

## 2020-10-09 DIAGNOSIS — R7303 Prediabetes: Secondary | ICD-10-CM | POA: Diagnosis not present

## 2020-10-09 DIAGNOSIS — G43009 Migraine without aura, not intractable, without status migrainosus: Secondary | ICD-10-CM | POA: Diagnosis not present

## 2020-10-09 DIAGNOSIS — I10 Essential (primary) hypertension: Secondary | ICD-10-CM | POA: Diagnosis not present

## 2020-10-09 DIAGNOSIS — R1013 Epigastric pain: Secondary | ICD-10-CM | POA: Diagnosis not present

## 2020-10-11 DIAGNOSIS — F333 Major depressive disorder, recurrent, severe with psychotic symptoms: Secondary | ICD-10-CM | POA: Diagnosis not present

## 2020-10-17 DIAGNOSIS — R87810 Cervical high risk human papillomavirus (HPV) DNA test positive: Secondary | ICD-10-CM | POA: Diagnosis not present

## 2020-10-17 DIAGNOSIS — Z308 Encounter for other contraceptive management: Secondary | ICD-10-CM | POA: Diagnosis not present

## 2020-10-17 DIAGNOSIS — T8332XD Displacement of intrauterine contraceptive device, subsequent encounter: Secondary | ICD-10-CM | POA: Diagnosis not present

## 2020-10-17 DIAGNOSIS — B20 Human immunodeficiency virus [HIV] disease: Secondary | ICD-10-CM | POA: Diagnosis not present

## 2020-10-25 ENCOUNTER — Ambulatory Visit (HOSPITAL_COMMUNITY): Payer: BC Managed Care – PPO | Admitting: Licensed Clinical Social Worker

## 2020-11-15 ENCOUNTER — Ambulatory Visit (INDEPENDENT_AMBULATORY_CARE_PROVIDER_SITE_OTHER): Payer: BC Managed Care – PPO | Admitting: Licensed Clinical Social Worker

## 2020-11-15 DIAGNOSIS — F200 Paranoid schizophrenia: Secondary | ICD-10-CM
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
## 2020-12-05 DIAGNOSIS — F333 Major depressive disorder, recurrent, severe with psychotic symptoms: Secondary | ICD-10-CM | POA: Diagnosis not present

## 2020-12-21 DIAGNOSIS — Z01812 Encounter for preprocedural laboratory examination: Secondary | ICD-10-CM | POA: Diagnosis not present

## 2020-12-21 DIAGNOSIS — R000 Tachycardia, unspecified: Secondary | ICD-10-CM | POA: Diagnosis not present

## 2020-12-21 DIAGNOSIS — Y762 Prosthetic and other implants, materials and accessory obstetric and gynecological devices associated with adverse incidents: Secondary | ICD-10-CM | POA: Diagnosis not present

## 2020-12-21 DIAGNOSIS — Z01810 Encounter for preprocedural cardiovascular examination: Secondary | ICD-10-CM | POA: Diagnosis not present

## 2020-12-21 DIAGNOSIS — T8332XA Displacement of intrauterine contraceptive device, initial encounter: Secondary | ICD-10-CM | POA: Diagnosis not present

## 2020-12-22 DIAGNOSIS — Z79899 Other long term (current) drug therapy: Secondary | ICD-10-CM | POA: Diagnosis not present

## 2020-12-22 DIAGNOSIS — Z01818 Encounter for other preprocedural examination: Secondary | ICD-10-CM | POA: Diagnosis not present

## 2020-12-22 DIAGNOSIS — B20 Human immunodeficiency virus [HIV] disease: Secondary | ICD-10-CM | POA: Diagnosis not present

## 2020-12-22 DIAGNOSIS — I10 Essential (primary) hypertension: Secondary | ICD-10-CM | POA: Diagnosis not present

## 2020-12-26 ENCOUNTER — Ambulatory Visit (HOSPITAL_COMMUNITY): Payer: BC Managed Care – PPO | Admitting: Licensed Clinical Social Worker

## 2020-12-26 DIAGNOSIS — F200 Paranoid schizophrenia: Secondary | ICD-10-CM
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
## 2020-12-27 ENCOUNTER — Other Ambulatory Visit: Payer: Self-pay | Admitting: Infectious Disease

## 2020-12-27 DIAGNOSIS — B20 Human immunodeficiency virus [HIV] disease: Secondary | ICD-10-CM

## 2020-12-27 NOTE — Telephone Encounter (Signed)
Pt has appointment 11/16

## 2021-01-10 ENCOUNTER — Ambulatory Visit: Payer: BC Managed Care – PPO | Admitting: Infectious Disease

## 2021-01-12 ENCOUNTER — Other Ambulatory Visit: Payer: Self-pay | Admitting: Infectious Disease

## 2021-01-12 ENCOUNTER — Telehealth: Payer: Self-pay

## 2021-01-12 ENCOUNTER — Ambulatory Visit: Payer: BC Managed Care – PPO | Admitting: Infectious Disease

## 2021-01-12 DIAGNOSIS — B20 Human immunodeficiency virus [HIV] disease: Secondary | ICD-10-CM

## 2021-01-12 NOTE — Telephone Encounter (Signed)
MyChart message sent.   Jamonte Curfman D Zay Yeargan, RN  

## 2021-01-17 DIAGNOSIS — Z3202 Encounter for pregnancy test, result negative: Secondary | ICD-10-CM | POA: Diagnosis not present

## 2021-01-17 DIAGNOSIS — R87810 Cervical high risk human papillomavirus (HPV) DNA test positive: Secondary | ICD-10-CM | POA: Diagnosis not present

## 2021-01-17 DIAGNOSIS — Z30432 Encounter for removal of intrauterine contraceptive device: Secondary | ICD-10-CM | POA: Diagnosis not present

## 2021-01-22 DIAGNOSIS — E6609 Other obesity due to excess calories: Secondary | ICD-10-CM | POA: Diagnosis not present

## 2021-01-22 DIAGNOSIS — I10 Essential (primary) hypertension: Secondary | ICD-10-CM | POA: Diagnosis not present

## 2021-01-23 ENCOUNTER — Ambulatory Visit (INDEPENDENT_AMBULATORY_CARE_PROVIDER_SITE_OTHER): Payer: BC Managed Care – PPO | Admitting: Licensed Clinical Social Worker

## 2021-01-23 DIAGNOSIS — F200 Paranoid schizophrenia: Secondary | ICD-10-CM | POA: Diagnosis not present

## 2021-01-23 NOTE — Progress Notes (Signed)
Virtual Visit via Video Note  I connected with Adriana Fernandez on 01/23/21 at  9:00 AM EST by a video enabled telemedicine application and verified that I am speaking with the correct person using two identifiers.  Location: Patient: stationary car Provider: offce   I discussed the limitations of evaluation and management by telemedicine and the availability of in person appointments. The patient expressed understanding and agreed to proceed.    I discussed the assessment and treatment plan with the patient. The patient was provided an opportunity to ask questions and all were answered. The patient agreed with the plan and demonstrated an understanding of the instructions.   The patient was advised to call back or seek an in-person evaluation if the symptoms worsen or if the condition fails to improve as anticipated.  I provided 45 minutes of non-face-to-face time during this encounter.  THERAPIST PROGRESS NOTE  Session Time: 9:00 AM to 9:45 AM  Participation Level: Active  Behavioral Response: CasualAlertEuthymic  Type of Therapy: Individual Therapy  Treatment Goals addressed:  Work on coping with schizophrenic episodes and paranoia, insight building for patient to make healthy choices for herself, coping Interventions: Solution Focused, Strength-based, Supportive, and Other: coping  Summary: Adriana Fernandez is a 50 y.o. female who presents with no complaints. Tell therapist what is keeping her calm the neighborhood cat. Knows was hyper last time. Wants therapist to write a letter for emotional support animal. They cuddle and tranquility comes over her and helps calm down. So sweet. Neighborhood cat feed her and pet her. Relax her and keep her so calm. Lives in neighborhood but like her to stay so why ask for letter. Husband may move in and if he does he won't want an animal so has to see.  Reviewed difference between service animal and emotional support animal patient  will see if her landlord what is allowed get back to therapist. Therapist asked patient to describe more the cat and she says the animal is like a Morris cat. Will send therapist a picture. Patient went on to say that she likes therapy likes to talk about things. The cat is wild neighborhood cat she bites but playing. She is sweet and keeps her calm. Sweet doesn't attack people. Calls her Princess. Discussed her relationship with husband and trying. She feels they need marriage counseling. Shared abut her symptoms right now she likes her voices they are they say nice stuff calmed down a lot.  Therapist noted her impression with patient's good mood that this would be the case.  Noted the stressing is helpful for managing symptoms per therapist Patient says trying not to stress. Adriana Fernandez part-time and working at [employer]. Therapist pointed out nice to make extra money. Doesn't sell a lot thinks maybe because of pandemic.  Going back to conversation about husband she relates let him decide says at the same time he wants his own place. She is not moving. Content with her place. Thanksgiving kitty was with her and she was fine. Talked about plans for Christmas relax and watch Christmas movies and will be him or the kitty. Says gets by. Good to have an emotional support with paranoid schizophrenia. Loves her job at [employer], loves her apartment loves her bills. Loves her family and true friends, loves her kitty. Her love is godly love. Hers is agape love.  Therapist noted indications of elevation of mood per patient statements.  Therapist reviewed symptoms, facilitated expression of thoughts and feelings talked about  in this session patient wanting a letter for her emotional support animal.  Episode learn more about her animal and why comfort to her agreed with patient with her paranoid schizophrenia would have a calming effect.  Clarified the difference between a service animal and emotional support animal so  patient make sure that therapist can write the letter if it is a service animal has to be specially trained and cannot be a cat.  Noted calming effects of an animal they can pick up your emotions easily which is helpful and soothing her mental health, offer unconditional love.  Noted things in life seem to be going okay for patient as she says she is managing.  Noted less hyper today and that is a positive more calm.  Explored area of her life and how things are going as far as work relationships things seem to be going okay a big factor is patient's voices right now are not bothering her which helps calm her symptoms.  Noted in session patient pointing out helpfulness of therapy as a place where she can talk about things so that indicates actively working on therapeutic goals as this is a outlet for patient to work through things.  Noted video contact was disruptive at times which make communication a little less clear but still managed through session.  Therapist provided active listening open questions, supportive interventions. Suicidal/Homicidal: No  Plan: Return again in 6 weeks.(First available with patient scheduling holidays) 2.  Patient touch base with therapist about letter for emotional support animal, work with patient and maintaining stability with symptoms, stress management, coping  Diagnosis: Axis I: paranoid schizophrenia    Axis II: No diagnosis    Coolidge Breeze, LCSW 01/23/2021

## 2021-01-30 DIAGNOSIS — F333 Major depressive disorder, recurrent, severe with psychotic symptoms: Secondary | ICD-10-CM | POA: Diagnosis not present

## 2021-02-20 ENCOUNTER — Other Ambulatory Visit: Payer: Self-pay | Admitting: Infectious Disease

## 2021-02-20 DIAGNOSIS — B20 Human immunodeficiency virus [HIV] disease: Secondary | ICD-10-CM

## 2021-02-20 NOTE — Telephone Encounter (Signed)
Front desk to call and schedule follow up. Spoke with patient, she's aware she needs appointment for future refills.   Sandie Ano, RN

## 2021-03-06 ENCOUNTER — Encounter (HOSPITAL_COMMUNITY): Payer: Self-pay | Admitting: Licensed Clinical Social Worker

## 2021-03-06 ENCOUNTER — Ambulatory Visit (INDEPENDENT_AMBULATORY_CARE_PROVIDER_SITE_OTHER): Payer: BC Managed Care – PPO | Admitting: Licensed Clinical Social Worker

## 2021-03-06 DIAGNOSIS — F200 Paranoid schizophrenia: Secondary | ICD-10-CM
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
## 2021-03-26 ENCOUNTER — Ambulatory Visit: Payer: BC Managed Care – PPO | Admitting: Infectious Disease

## 2021-03-26 NOTE — Progress Notes (Deleted)
Chief compliant: followup for HIV on meds (an "elite controller")   Subjective:    Patient ID: Adriana Fernandez, female    DOB: 1970/09/03, 51 y.o.   MRN: [id number]  HPI  51   year old [place] American lady with HIV who is an ELITE controller with undetectable viral load and healthy CD4 count since 1992 when she was diagnosed.   She was enrolled in ACTG trial for this but then could not stay in trial due to drugs she needed for her schizophrenia..  We have subsequently had her on BIKTARVY   Likes this medication quite a lot.  I did discuss with her the idea of potentially having her on less drugs since he is in late control anyway and switching her to [facility] but she would prefer to stay on [place] for now.  She has had to [place] dose is the most recent one being in April and I offered her a third dose of [place] today but she declined being apprehensive about side effects from the vaccine since she had flulike symptoms after her second dose.  I have encouraged her still to get a third dose but she will think about it for now.    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)     Past Surgical History:  Procedure Laterality Date   CHOLECYSTECTOMY N/A 06/22/2016   Procedure: LAPAROSCOPIC CHOLECYSTECTOMY WITH INTRAOPERATIVE CHOLANGIOGRAM;  Surgeon: Alphonsa Overall, MD;  Location: [facility];  Service: General;  Laterality: N/A;   ERCP N/A 06/21/2016   Procedure: ENDOSCOPIC RETROGRADE CHOLANGIOPANCREATOGRAPHY (ERCP);  Surgeon: Gatha Mayer, MD;  Location: [facility];  Service: Endoscopy;  Laterality: N/A;    Family History  Problem Relation Age of Onset   GER disease Mother     Hypertension Mother    Sickle cell anemia Brother    Diabetes Maternal Aunt    Diabetes Maternal Grandmother    Breast cancer Maternal Aunt    Throat cancer Paternal Grandmother       Social History   Socioeconomic History   Marital status: Divorced    Spouse name: Not on file   Number of children: Not on file   Years of education: Not on file   Highest education level: Not on file  Occupational History   Not on file  Tobacco Use   Smoking status: Never   Smokeless tobacco: Never  Substance and Sexual Activity   Alcohol use: No   Drug use: No   Sexual activity: Yes    Partners: Male    Birth control/protection: I.U.D.  Other Topics Concern   Not on file  Social History Narrative   Not on file   Social Determinants of Health   Financial Resource Strain: Not on file  Food Insecurity: Not on file  Transportation Needs: Not on file  Physical Activity: Not on file  Stress: Not on file  Social Connections: Not on file    Allergies  Allergen Reactions   Dilaudid [Hydromorphone Hcl] Itching   Latex      Current Outpatient Medications:    atenolol-chlorthalidone (TENORETIC) 50-25 MG tablet, Take 1 tablet by mouth  daily., Disp: , Rfl:    benztropine (COGENTIN) 0.5 MG tablet, Take 0.5 mg by mouth 2 (two) times daily., Disp: , Rfl:    BIKTARVY 50-200-25 MG TABS tablet, TAKE 1 TABLET BY MOUTH 1 TIME A DAY., Disp: 30 tablet, Rfl: 0   buPROPion (WELLBUTRIN SR) 150 MG 12 hr tablet, Take 1 tablet (150 mg total) by mouth 2 (two) times daily., Disp: 60 tablet, Rfl: 0   dicyclomine (BENTYL) 20 MG tablet, Take 1 tablet (20 mg total) by mouth 2 (two) times daily. (Patient not taking: Reported on 08/04/2017), Disp: 20 tablet, Rfl: 0   esomeprazole (NEXIUM) 40 MG capsule, Take 40 mg by mouth 2 (two) times daily., Disp: , Rfl:    fluconazole (DIFLUCAN) 150 MG tablet, Take 150 mg by mouth once., Disp: , Rfl:    ibuprofen (ADVIL,MOTRIN) 600 MG tablet, Take 1  tablet (600 mg total) by mouth every 6 (six) hours as needed. (Patient not taking: Reported on 05/12/2017), Disp: 30 tablet, Rfl: 0   LATUDA 20 MG TABS tablet, TAKE 1 TABLET BY MOUTH AT BEDTIME WITY H350 CALORIES OF FOOD DO NOT DRIVE OR OPERATE MACHINERY. DO NOT USE ALCOHOL, Disp: , Rfl:    metroNIDAZOLE (METROGEL) 0.75 % vaginal gel, SMARTSIG:1 Vaginal 4-5 Times Daily, Disp: , Rfl:    ondansetron (ZOFRAN ODT) 8 MG disintegrating tablet, 8mg  ODT q8 hours prn nausea (Patient not taking: Reported on 05/12/2017), Disp: 12 tablet, Rfl: 0   paliperidone (INVEGA) 3 MG 24 hr tablet, Take 3 mg by mouth daily. , Disp: , Rfl:    phentermine (ADIPEX-P) 37.5 MG tablet, TAKE 1 2 (ONE HALF) TABLET BY MOUTH ONCE DAILY, Disp: , Rfl:    RisperiDONE (RISPERDAL PO), Take by mouth., Disp: , Rfl:    valACYclovir (VALTREX) 1000 MG tablet, Take 1,000 mg by mouth daily., Disp: , Rfl:     Review of Systems  Constitutional:  Negative for activity change, appetite change, chills, diaphoresis, fatigue, fever and unexpected weight change.  HENT:  Negative for congestion, rhinorrhea, sinus pressure, sneezing, sore throat and trouble swallowing.   Eyes:  Negative for photophobia and visual disturbance.  Respiratory:  Negative for cough, chest tightness, shortness of breath, wheezing and stridor.   Cardiovascular:  Negative for chest pain, palpitations and leg swelling.  Gastrointestinal:  Negative for abdominal distention, abdominal pain, anal bleeding, blood in stool, constipation, diarrhea, nausea and vomiting.  Genitourinary:  Negative for difficulty urinating, dysuria, flank pain and hematuria.  Musculoskeletal:  Negative for arthralgias, back pain, gait problem, joint swelling and myalgias.  Skin:  Negative for color change, pallor, rash and wound.  Neurological:  Negative for dizziness, tremors, weakness and light-headedness.  Hematological:  Negative for adenopathy. Does not bruise/bleed easily.   Psychiatric/Behavioral:  Negative for agitation, behavioral problems, confusion, decreased concentration, dysphoric mood, hallucinations, self-injury, sleep disturbance and suicidal ideas. The patient is not nervous/anxious and is not hyperactive.        Objective:   Physical Exam Vitals and nursing note reviewed.  Constitutional:      General: She is not in acute distress.    Appearance: She is well-developed. She is not diaphoretic.  HENT:     Head: Normocephalic and atraumatic.     Mouth/Throat:     Pharynx: No oropharyngeal exudate.  Eyes:     General: No scleral icterus.    Conjunctiva/sclera: Conjunctivae normal.     Pupils: Pupils are equal, round, and reactive to light.  Neck:     Vascular:  No JVD.  Cardiovascular:     Rate and Rhythm: Normal rate and regular rhythm.  Pulmonary:     Effort: Pulmonary effort is normal. No respiratory distress.     Breath sounds: No wheezing.  Chest:     Chest wall: No tenderness.  Abdominal:     General: There is no distension.  Musculoskeletal:        General: No tenderness.     Cervical back: Normal range of motion and neck supple.  Lymphadenopathy:     Cervical: No cervical adenopathy.  Skin:    General: Skin is warm and dry.     Coloration: Skin is not pale.     Findings: No erythema.  Neurological:     General: No focal deficit present.     Mental Status: She is alert and oriented to person, place, and time.     Motor: No abnormal muscle tone.     Coordination: Coordination normal.     Deep Tendon Reflexes: Reflexes are normal and symmetric.  Psychiatric:        Mood and Affect: Mood normal.        Speech: Speech normal.        Behavior: Behavior normal.        Thought Content: Thought content normal.        Judgment: Judgment normal.          Assessment & Plan:  HIV: elite controller. [place]  Likes being on  BIKTARVY.  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

## 2021-03-27 DIAGNOSIS — B20 Human immunodeficiency virus [HIV] disease: Secondary | ICD-10-CM | POA: Diagnosis not present

## 2021-03-27 DIAGNOSIS — Z3202 Encounter for pregnancy test, result negative: Secondary | ICD-10-CM | POA: Diagnosis not present

## 2021-03-27 DIAGNOSIS — N912 Amenorrhea, unspecified: Secondary | ICD-10-CM | POA: Diagnosis not present

## 2021-03-27 DIAGNOSIS — R87618 Other abnormal cytological findings on specimens from cervix uteri: Secondary | ICD-10-CM | POA: Diagnosis not present

## 2021-03-27 DIAGNOSIS — R87810 Cervical high risk human papillomavirus (HPV) DNA test positive: Secondary | ICD-10-CM | POA: Diagnosis not present

## 2021-04-03 ENCOUNTER — Ambulatory Visit (HOSPITAL_COMMUNITY): Payer: BC Managed Care – PPO | Admitting: Licensed Clinical Social Worker

## 2021-04-03 DIAGNOSIS — F333 Major depressive disorder, recurrent, severe with psychotic symptoms: Secondary | ICD-10-CM | POA: Diagnosis not present

## 2021-04-06 ENCOUNTER — Ambulatory Visit (INDEPENDENT_AMBULATORY_CARE_PROVIDER_SITE_OTHER): Payer: BC Managed Care – PPO | Admitting: Licensed Clinical Social Worker

## 2021-04-06 DIAGNOSIS — F200 Paranoid schizophrenia: Secondary | ICD-10-CM
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
## 2021-04-08 DIAGNOSIS — N912 Amenorrhea, unspecified: Secondary | ICD-10-CM | POA: Insufficient documentation

## 2021-04-09 DIAGNOSIS — I10 Essential (primary) hypertension: Secondary | ICD-10-CM | POA: Diagnosis not present

## 2021-04-09 DIAGNOSIS — E559 Vitamin D deficiency, unspecified: Secondary | ICD-10-CM | POA: Diagnosis not present

## 2021-04-09 DIAGNOSIS — R1013 Epigastric pain: Secondary | ICD-10-CM | POA: Diagnosis not present

## 2021-04-09 DIAGNOSIS — E6609 Other obesity due to excess calories: Secondary | ICD-10-CM | POA: Diagnosis not present

## 2021-04-09 DIAGNOSIS — Z131 Encounter for screening for diabetes mellitus: Secondary | ICD-10-CM | POA: Diagnosis not present

## 2021-04-09 DIAGNOSIS — Z1329 Encounter for screening for other suspected endocrine disorder: Secondary | ICD-10-CM | POA: Diagnosis not present

## 2021-04-09 DIAGNOSIS — Z0001 Encounter for general adult medical examination with abnormal findings: Secondary | ICD-10-CM | POA: Diagnosis not present

## 2021-04-09 DIAGNOSIS — Z136 Encounter for screening for cardiovascular disorders: Secondary | ICD-10-CM | POA: Diagnosis not present

## 2021-04-12 DIAGNOSIS — F333 Major depressive disorder, recurrent, severe with psychotic symptoms: Secondary | ICD-10-CM | POA: Diagnosis not present

## 2021-04-20 ENCOUNTER — Other Ambulatory Visit: Payer: Self-pay | Admitting: Infectious Disease

## 2021-04-20 DIAGNOSIS — B20 Human immunodeficiency virus [HIV] disease: Secondary | ICD-10-CM

## 2021-04-20 NOTE — Telephone Encounter (Signed)
Medication last dispensed 2/13, patient has not been seen since 12/2019. Additional refills will be provided at 3/13 appt.   Sandie Ano, RN

## 2021-05-03 ENCOUNTER — Ambulatory Visit (INDEPENDENT_AMBULATORY_CARE_PROVIDER_SITE_OTHER): Payer: BC Managed Care – PPO | Admitting: Licensed Clinical Social Worker

## 2021-05-03 ENCOUNTER — Other Ambulatory Visit: Payer: Self-pay | Admitting: Infectious Disease

## 2021-05-03 DIAGNOSIS — F200 Paranoid schizophrenia: Secondary | ICD-10-CM

## 2021-05-03 DIAGNOSIS — B20 Human immunodeficiency virus [HIV] disease: Secondary | ICD-10-CM
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
## 2021-05-03 NOTE — Telephone Encounter (Signed)
Appointment 3/10 ?

## 2021-05-07 ENCOUNTER — Ambulatory Visit: Payer: BC Managed Care – PPO | Admitting: Infectious Disease

## 2021-05-07 ENCOUNTER — Encounter: Payer: Self-pay | Admitting: Infectious Disease

## 2021-05-07 ENCOUNTER — Other Ambulatory Visit: Payer: Self-pay

## 2021-05-07 VITALS — BP 111/76 | HR 76 | Temp 97.3°F | Wt 221.0 lb

## 2021-05-07 DIAGNOSIS — B20 Human immunodeficiency virus [HIV] disease: Secondary | ICD-10-CM

## 2021-05-07 DIAGNOSIS — D573 Sickle-cell trait: Secondary | ICD-10-CM | POA: Diagnosis not present

## 2021-05-07 DIAGNOSIS — Z9049 Acquired absence of other specified parts of digestive tract: Secondary | ICD-10-CM | POA: Insufficient documentation

## 2021-05-07 DIAGNOSIS — R87618 Other abnormal cytological findings on specimens from cervix uteri: Secondary | ICD-10-CM | POA: Diagnosis not present

## 2021-05-07 DIAGNOSIS — E559 Vitamin D deficiency, unspecified: Secondary | ICD-10-CM | POA: Insufficient documentation

## 2021-05-07 DIAGNOSIS — Z113 Encounter for screening for infections with a predominantly sexual mode of transmission: Secondary | ICD-10-CM | POA: Diagnosis not present

## 2021-05-07 DIAGNOSIS — Z6831 Body mass index (BMI) 31.0-31.9, adult: Secondary | ICD-10-CM | POA: Insufficient documentation

## 2021-05-07 DIAGNOSIS — I10 Essential (primary) hypertension: Secondary | ICD-10-CM | POA: Insufficient documentation

## 2021-05-07 DIAGNOSIS — R7303 Prediabetes: Secondary | ICD-10-CM | POA: Insufficient documentation

## 2021-05-07 MED ORDER — DOVATO 50-300 MG PO TABS: 1.0000 | ORAL_TABLET | Freq: Every day | ORAL | 11 refills | Status: DC

## 2021-05-07 NOTE — Progress Notes (Signed)
?Chief compliant: Loose bowel movements ? ?Subjective:  ? ? Patient ID: Adriana Fernandez, female    DOB: May 02, 1970, 51 y.o.   MRN: [id number] ? ?HPI ? 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 ? ? She was enrolled in ACTG trial for this but then could not stay in trial due to drugs she needed for her schizophrenia.. ? ?We have subsequently had her on BIKTARVY  ?She would like BIKTARVY quite a bit though I discussed the idea of simplifying her to Dovato to reduce number of medications in her. ? ?Since I last saw her she had worsening of loose bowel movements which she then attributed to [facility] and stopped taking the medication. ? ?She now feels that this problem with loose bowel movements more likely related to her cholecystectomy and I think that is quite reasonable and more likely. ? ?For however to change her Dovato in this situation in case this also helps with any side effects that she could be having from them though I doubt that GI problems have anything to do with BIKTARVY. ? ?He is apparently scheduled for tubal ligation and hysterectomy with gynecology. ? ? ? ? ? ? ?Past Medical History:  ?Diagnosis Date  ? Depression   ? Elevated LFTs   ? Gallstones   ? HIV disease ([place])   ? 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 Hypertension   ? Paranoid schizophrenia ([place])   ? Psychosis ([place])   ? Schizophrenia ([place])   ? Sickle cell trait ([place])   ? ? ?Past Surgical History:  ?Procedure Laterality Date  ? CHOLECYSTECTOMY N/A 06/22/2016  ? Procedure: LAPAROSCOPIC CHOLECYSTECTOMY WITH INTRAOPERATIVE CHOLANGIOGRAM;  Surgeon: Alphonsa Overall, MD;  Location: [facility];  Service: General;  Laterality: N/A;  ? ERCP N/A 06/21/2016  ? Procedure: ENDOSCOPIC RETROGRADE CHOLANGIOPANCREATOGRAPHY (ERCP);  Surgeon: Gatha Mayer, MD;  Location: [facility];  Service: Endoscopy;  Laterality: N/A;  ? ? ?Family History  ?Problem Relation Age of Onset  ? GER disease Mother   ? Hypertension Mother   ? Sickle cell anemia Brother   ? Diabetes Maternal Aunt   ? Diabetes Maternal Grandmother   ? Breast cancer Maternal Aunt   ? Throat cancer Paternal Grandmother   ? ? ?  ?Social History  ? ?Socioeconomic History  ? Marital status: Divorced  ?  Spouse name: Not on file  ? Number of children: Not on file  ? Years of education: Not on file  ? Highest education level: Not on file  ?Occupational History  ? Not on file  ?Tobacco Use  ? Smoking status: Never  ? Smokeless tobacco: Never  ?Substance and Sexual Activity  ? Alcohol use: No  ? Drug use: No  ? Sexual activity: Yes  ?  Partners: Male  ?  Birth control/protection: I.U.D.  ?Other Topics Concern  ? Not on file  ?Social History Narrative  ? Not on file  ? ?Social Determinants of Health  ? ?Financial Resource Strain: Not on file  ?Food Insecurity: Not on file  ?Transportation Needs: Not on file  ?Physical Activity: Not on file  ?Stress: Not on file  ?Social Connections: Not on file  ? ? ?Allergies  ?Allergen Reactions  ? Dilaudid [Hydromorphone Hcl] Itching  ? Latex   ? ? ? ?Current Outpatient  Medications:  ?  atenolol-chlorthalidone (TENORETIC) 50-25 MG tablet, Take 1 tablet by mouth daily., Disp: , Rfl:  ?  benztropine (COGENTIN) 0.5 MG tablet, Take 0.5 mg by mouth 2 (two) times daily., Disp: , Rfl:  ?  BIKTARVY 50-200-25 MG TABS tablet, TAKE 1 TABLET BY MOUTH 1 TIME A DAY., Disp: 30 tablet, Rfl: 0 ?  buPROPion (WELLBUTRIN SR) 150 MG 12 hr tablet, Take 1 tablet (150 mg total) by mouth 2 (two) times daily., Disp: 60 tablet, Rfl: 0 ?  dicyclomine (BENTYL) 20 MG tablet, Take 1 tablet (20 mg total) by mouth 2 (two) times daily. (Patient not taking: Reported on 08/04/2017), Disp: 20 tablet, Rfl: 0 ?  esomeprazole (NEXIUM) 40 MG capsule, Take 40 mg by mouth 2 (two) times daily., Disp: , Rfl:  ?  fluconazole (DIFLUCAN) 150 MG  tablet, Take 150 mg by mouth once., Disp: , Rfl:  ?  ibuprofen (ADVIL,MOTRIN) 600 MG tablet, Take 1 tablet (600 mg total) by mouth every 6 (six) hours as needed. (Patient not taking: Reported on 05/12/2017), Disp: 30 tablet, Rfl: 0 ?  LATUDA 20 MG TABS tablet, TAKE 1 TABLET BY MOUTH AT BEDTIME WITY H350 CALORIES OF FOOD DO NOT DRIVE OR OPERATE MACHINERY. DO NOT USE ALCOHOL, Disp: , Rfl:  ?  metroNIDAZOLE (METROGEL) 0.75 % vaginal gel, SMARTSIG:1 Vaginal 4-5 Times Daily, Disp: , Rfl:  ?  ondansetron (ZOFRAN ODT) 8 MG disintegrating tablet, 8mg  ODT q8 hours prn nausea (Patient not taking: Reported on 05/12/2017), Disp: 12 tablet, Rfl: 0 ?  paliperidone (INVEGA) 3 MG 24 hr tablet, Take 3 mg by mouth daily. , Disp: , Rfl:  ?  phentermine (ADIPEX-P) 37.5 MG tablet, TAKE 1 2 (ONE HALF) TABLET BY MOUTH ONCE DAILY, Disp: , Rfl:  ?  RisperiDONE (RISPERDAL PO), Take by mouth., Disp: , Rfl:  ?  valACYclovir (VALTREX) 1000 MG tablet, Take 1,000 mg by mouth daily., Disp: , Rfl:  ? ? ? ?Review of Systems  ?Constitutional:  Negative for activity change, appetite change, chills, diaphoresis, fatigue, fever and unexpected weight change.  ?HENT:  Negative for congestion, rhinorrhea, sinus pressure, sneezing, sore throat and trouble swallowing.   ?Eyes:  Negative for photophobia and visual disturbance.  ?Respiratory:  Negative for cough, chest tightness, shortness of breath, wheezing and stridor.   ?Cardiovascular:  Negative for chest pain, palpitations and leg swelling.  ?Gastrointestinal:  Positive for diarrhea. Negative for abdominal distention, abdominal pain, anal bleeding, blood in stool, constipation, nausea and vomiting.  ?Genitourinary:  Negative for difficulty urinating, dysuria, flank pain and hematuria.  ?Musculoskeletal:  Negative for arthralgias, back pain, gait problem, joint swelling and myalgias.  ?Skin:  Negative for color change, pallor, rash and wound.  ?Neurological:  Negative for dizziness, tremors, weakness and  light-headedness.  ?Hematological:  Negative for adenopathy. Does not bruise/bleed easily.  ?Psychiatric/Behavioral:  Negative for agitation, behavioral problems, confusion, decreased concentration, dysphoric mood and sleep disturbance.   ? ?   ?Objective:  ? Physical Exam ?Vitals reviewed.  ?Constitutional:   ?   Appearance: Normal appearance.  ?HENT:  ?   Head: Normocephalic and atraumatic.  ?   Nose: Nose normal. No congestion or rhinorrhea.  ?Cardiovascular:  ?   Rate and Rhythm: Normal rate and regular rhythm.  ?Pulmonary:  ?   Effort: Pulmonary effort is normal. No respiratory distress.  ?   Breath sounds: No wheezing.  ?Abdominal:  ?   General: There is no distension.  ?Musculoskeletal:     ?  General: Normal range of motion.  ?Skin: ?   General: Skin is warm and dry.  ?Neurological:  ?   General: No focal deficit present.  ?   Mental Status: She is alert and oriented to person, place, and time.  ?Psychiatric:     ?   Mood and Affect: Mood normal.     ?   Behavior: Behavior normal.     ?   Thought Content: Thought content normal.     ?   Judgment: Judgment normal.  ? ? ? ? ? ?   ?Assessment & Plan:  ? ?HIV an elite controller ? ?I will recheck her viral load which still should be suppressed given her elite controller status CD4 CBC CMP. ? ?We are switching her over to [facility] and I have sent this to her mail order pharmacy and given her a [place] co-pay assist card.  I like to see her back in 1 month's time. ? ?Note there is data suggest that treating elite controller is antivirals reduce the inflammatory burden that is carried by their ongoing active suppression of HIV with their immune system. ? ? ? ?Loose stools with history of cholecystectomy: Hopefully this can improve potentially she may benefit by seeing dietician ? ?History of abnormal Pap smear: Apparently more recently was normal going to have her ectomy and bilateral salpingo-oophorectomy. ? ? ? ? ? ?

## 2021-05-08 DIAGNOSIS — Z01818 Encounter for other preprocedural examination: Secondary | ICD-10-CM | POA: Diagnosis not present

## 2021-05-08 DIAGNOSIS — R87810 Cervical high risk human papillomavirus (HPV) DNA test positive: Secondary | ICD-10-CM | POA: Diagnosis not present

## 2021-05-08 DIAGNOSIS — Z975 Presence of (intrauterine) contraceptive device: Secondary | ICD-10-CM | POA: Diagnosis not present

## 2021-05-08 DIAGNOSIS — R7303 Prediabetes: Secondary | ICD-10-CM | POA: Diagnosis not present

## 2021-05-08 DIAGNOSIS — Z9049 Acquired absence of other specified parts of digestive tract: Secondary | ICD-10-CM | POA: Diagnosis not present

## 2021-05-10 LAB — LIPID PANEL
Cholesterol: 115 mg/dL (ref <200)
HDL: 38 mg/dL — ABNORMAL LOW (ref >=50)
LDL Cholesterol (Calc): 57 mg/dL (calc)
Non-HDL Cholesterol (Calc): 77 mg/dL (calc) (ref <130)
Total CHOL/HDL Ratio: 3 (calc) (ref <5.0)
Triglycerides: 122 mg/dL (ref <150)

## 2021-05-10 LAB — COMPLETE METABOLIC PANEL WITH GFR
AG Ratio: 1.3 (calc) (ref 1.0–2.5)
ALT: 14 U/L (ref 6–29)
AST: 14 U/L (ref 10–35)
Albumin: 4.3 g/dL (ref 3.6–5.1)
Alkaline phosphatase (APISO): 94 U/L (ref 31–125)
BUN: 11 mg/dL (ref 7–25)
CO2: 29 mmol/L (ref 20–32)
Calcium: 9.6 mg/dL (ref 8.6–10.2)
Chloride: 103 mmol/L (ref 98–110)
Creat: 0.88 mg/dL (ref 0.50–0.99)
Globulin: 3.3 g/dL (calc) (ref 1.9–3.7)
Glucose, Bld: 86 mg/dL (ref 65–99)
Potassium: 3.3 mmol/L — ABNORMAL LOW (ref 3.5–5.3)
Sodium: 141 mmol/L (ref 135–146)
Total Bilirubin: 0.4 mg/dL (ref 0.2–1.2)
Total Protein: 7.6 g/dL (ref 6.1–8.1)
eGFR: 81 mL/min/{1.73_m2} (ref >=60)

## 2021-05-10 LAB — CBC WITH DIFFERENTIAL/PLATELET
Absolute Monocytes: 641 cells/uL (ref 200–950)
Basophils Absolute: 43 cells/uL (ref 0–200)
Basophils Relative: 0.7 %
Eosinophils Absolute: 18 cells/uL (ref 15–500)
Eosinophils Relative: 0.3 %
HCT: 39.4 % (ref 35.0–45.0)
Hemoglobin: 13.2 g/dL (ref 11.7–15.5)
Lymphs Abs: 1983 cells/uL (ref 850–3900)
MCH: 30.8 pg (ref 27.0–33.0)
MCHC: 33.5 g/dL (ref 32.0–36.0)
MCV: 91.8 fL (ref 80.0–100.0)
MPV: 11.2 fL (ref 7.5–12.5)
Monocytes Relative: 10.5 %
Neutro Abs: 3416 cells/uL (ref 1500–7800)
Neutrophils Relative %: 56 %
Platelets: 263 10*3/uL (ref 140–400)
RBC: 4.29 10*6/uL (ref 3.80–5.10)
RDW: 13.4 % (ref 11.0–15.0)
Total Lymphocyte: 32.5 %
WBC: 6.1 10*3/uL (ref 3.8–10.8)

## 2021-05-10 LAB — T-HELPER CELLS (CD4) COUNT (NOT AT ARMC)
Absolute CD4: 1045 cells/uL (ref 490–1740)
CD4 T Helper %: 50 % (ref 30–61)
Total lymphocyte count: 2094 cells/uL (ref 850–3900)

## 2021-05-10 LAB — RPR: RPR Ser Ql: NONREACTIVE

## 2021-05-10 LAB — HIV-1 RNA QUANT-NO REFLEX-BLD
HIV 1 RNA Quant: NOT DETECTED Copies/mL
HIV-1 RNA Quant, Log: NOT DETECTED Log cps/mL

## 2021-05-10 LAB — C. TRACHOMATIS/N. GONORRHOEAE RNA
C. trachomatis RNA, TMA: NOT DETECTED
N. gonorrhoeae RNA, TMA: NOT DETECTED

## 2021-05-16 DIAGNOSIS — T8332XA Displacement of intrauterine contraceptive device, initial encounter: Secondary | ICD-10-CM | POA: Diagnosis not present

## 2021-05-16 DIAGNOSIS — D573 Sickle-cell trait: Secondary | ICD-10-CM | POA: Diagnosis not present

## 2021-05-16 DIAGNOSIS — Z30432 Encounter for removal of intrauterine contraceptive device: Secondary | ICD-10-CM | POA: Diagnosis not present

## 2021-05-16 DIAGNOSIS — N736 Female pelvic peritoneal adhesions (postinfective): Secondary | ICD-10-CM | POA: Diagnosis not present

## 2021-05-16 DIAGNOSIS — Z302 Encounter for sterilization: Secondary | ICD-10-CM | POA: Diagnosis not present

## 2021-05-16 DIAGNOSIS — B20 Human immunodeficiency virus [HIV] disease: Secondary | ICD-10-CM | POA: Diagnosis not present

## 2021-05-30 ENCOUNTER — Ambulatory Visit: Payer: BC Managed Care – PPO | Admitting: Infectious Disease

## 2021-05-30 DIAGNOSIS — F333 Major depressive disorder, recurrent, severe with psychotic symptoms: Secondary | ICD-10-CM | POA: Diagnosis not present

## 2021-06-05 ENCOUNTER — Ambulatory Visit (INDEPENDENT_AMBULATORY_CARE_PROVIDER_SITE_OTHER): Payer: BC Managed Care – PPO | Admitting: Licensed Clinical Social Worker

## 2021-06-05 DIAGNOSIS — F200 Paranoid schizophrenia: Secondary | ICD-10-CM
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
## 2021-06-12 DIAGNOSIS — E559 Vitamin D deficiency, unspecified: Secondary | ICD-10-CM | POA: Diagnosis not present

## 2021-06-12 DIAGNOSIS — I10 Essential (primary) hypertension: Secondary | ICD-10-CM | POA: Diagnosis not present

## 2021-06-12 DIAGNOSIS — E782 Mixed hyperlipidemia: Secondary | ICD-10-CM | POA: Diagnosis not present

## 2021-06-12 DIAGNOSIS — R1013 Epigastric pain: Secondary | ICD-10-CM | POA: Diagnosis not present

## 2021-06-13 ENCOUNTER — Other Ambulatory Visit: Payer: Self-pay

## 2021-06-13 ENCOUNTER — Other Ambulatory Visit (HOSPITAL_COMMUNITY)
Admission: RE | Admit: 2021-06-13 | Discharge: 2021-06-13 | Disposition: A | Discharge status: Home / Self Care | Payer: BC Managed Care – PPO | Source: Ambulatory Visit | Attending: Infectious Disease | Admitting: Infectious Disease

## 2021-06-13 ENCOUNTER — Encounter: Payer: Self-pay | Admitting: Infectious Disease

## 2021-06-13 ENCOUNTER — Ambulatory Visit: Payer: BC Managed Care – PPO | Admitting: Infectious Disease

## 2021-06-13 VITALS — BP 130/86 | HR 82 | Temp 98.5°F | Resp 16 | Ht 67.0 in | Wt 222.0 lb

## 2021-06-13 DIAGNOSIS — F200 Paranoid schizophrenia: Secondary | ICD-10-CM

## 2021-06-13 DIAGNOSIS — Z113 Encounter for screening for infections with a predominantly sexual mode of transmission: Secondary | ICD-10-CM

## 2021-06-13 DIAGNOSIS — B20 Human immunodeficiency virus [HIV] disease: Secondary | ICD-10-CM | POA: Diagnosis not present

## 2021-06-13 DIAGNOSIS — I10 Essential (primary) hypertension: Secondary | ICD-10-CM | POA: Diagnosis not present

## 2021-06-13 DIAGNOSIS — Z9049 Acquired absence of other specified parts of digestive tract: Secondary | ICD-10-CM

## 2021-06-13 DIAGNOSIS — F32A Depression, unspecified: Secondary | ICD-10-CM | POA: Diagnosis not present

## 2021-06-13 MED ORDER — DOVATO 50-300 MG PO TABS: 1.0000 | ORAL_TABLET | Freq: Every day | ORAL | 11 refills | Status: DC
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
## 2021-06-14 ENCOUNTER — Telehealth: Payer: Self-pay

## 2021-06-14 ENCOUNTER — Other Ambulatory Visit: Payer: Self-pay | Admitting: Infectious Disease

## 2021-06-14 DIAGNOSIS — B20 Human immunodeficiency virus [HIV] disease: Secondary | ICD-10-CM

## 2021-06-14 LAB — URINE CYTOLOGY ANCILLARY ONLY
Chlamydia: NEGATIVE
Comment: NEGATIVE
Comment: NORMAL
Neisseria Gonorrhea: NEGATIVE

## 2021-06-14 MED ORDER — POTASSIUM CHLORIDE CRYS ER 20 MEQ PO TBCR: 40.0000 meq | EXTENDED_RELEASE_TABLET | Freq: Two times a day (BID) | ORAL | 0 refills | Status: DC

## 2021-06-14 NOTE — Telephone Encounter (Signed)
Patient advised of low potassium levels and new orders for potassium supplement. Patient will need lab order to drop off at [facility] as she can not come in next week due to her work schedule for repeat CMP. Medication sent to pharmacy.  ?

## 2021-06-14 NOTE — Addendum Note (Signed)
Addended by: Valarie Cones on: 06/14/2021 02:03 PM ? ? Modules accepted: Orders ? ?

## 2021-06-14 NOTE — Telephone Encounter (Signed)
-----   Message from Randall Hiss, MD sent at 06/14/2021  6:42 AM EDT ----- ?Regarding: FW: ?I?ve already received TWO pages in middle of the night in this patient we saw yesterday at 9 am. Can we call in 40 meq of KCL. For her to take twice daily for a week and then repeat potassium in a week? ?----- Message ----- ?From: Interface, [facility] Lab Results In ?Sent: 06/13/2021   4:49 PM EDT ?To: Randall Hiss, MD ? ?

## 2021-06-16 LAB — COMPLETE METABOLIC PANEL WITH GFR
AG Ratio: 1.3 (calc) (ref 1.0–2.5)
ALT: 16 U/L (ref 6–29)
AST: 15 U/L (ref 10–35)
Albumin: 3.9 g/dL (ref 3.6–5.1)
Alkaline phosphatase (APISO): 106 U/L (ref 31–125)
BUN: 8 mg/dL (ref 7–25)
CO2: 29 mmol/L (ref 20–32)
Calcium: 9 mg/dL (ref 8.6–10.2)
Chloride: 103 mmol/L (ref 98–110)
Creat: 0.86 mg/dL (ref 0.50–0.99)
Globulin: 3 g/dL (calc) (ref 1.9–3.7)
Glucose, Bld: 101 mg/dL — ABNORMAL HIGH (ref 65–99)
Potassium: 2.6 mmol/L — CL (ref 3.5–5.3)
Sodium: 141 mmol/L (ref 135–146)
Total Bilirubin: 0.3 mg/dL (ref 0.2–1.2)
Total Protein: 6.9 g/dL (ref 6.1–8.1)
eGFR: 83 mL/min/{1.73_m2} (ref >=60)

## 2021-06-16 LAB — CBC WITH DIFFERENTIAL/PLATELET
Absolute Monocytes: 562 cells/uL (ref 200–950)
Basophils Absolute: 32 cells/uL (ref 0–200)
Basophils Relative: 0.6 %
Eosinophils Absolute: 38 cells/uL (ref 15–500)
Eosinophils Relative: 0.7 %
HCT: 35.8 % (ref 35.0–45.0)
Hemoglobin: 12 g/dL (ref 11.7–15.5)
Lymphs Abs: 1841 cells/uL (ref 850–3900)
MCH: 31.8 pg (ref 27.0–33.0)
MCHC: 33.5 g/dL (ref 32.0–36.0)
MCV: 95 fL (ref 80.0–100.0)
MPV: 10.8 fL (ref 7.5–12.5)
Monocytes Relative: 10.4 %
Neutro Abs: 2927 cells/uL (ref 1500–7800)
Neutrophils Relative %: 54.2 %
Platelets: 275 10*3/uL (ref 140–400)
RBC: 3.77 10*6/uL — ABNORMAL LOW (ref 3.80–5.10)
RDW: 14.5 % (ref 11.0–15.0)
Total Lymphocyte: 34.1 %
WBC: 5.4 10*3/uL (ref 3.8–10.8)

## 2021-06-16 LAB — HELPER T-LYMPH-CD4 (ARMC ONLY)
% CD 4 Pos. Lymph.: 63.7 % — ABNORMAL HIGH (ref 30.8–58.5)
Absolute CD 4 Helper: 1274 /uL (ref 359–1519)
Basophils Absolute: 0.1 10*3/uL (ref 0.0–0.2)
Basos: 1 %
EOS (ABSOLUTE): 0 10*3/uL (ref 0.0–0.4)
Eos: 1 %
Hematocrit: 36.8 % (ref 34.0–46.6)
Hemoglobin: 12.1 g/dL (ref 11.1–15.9)
Immature Grans (Abs): 0 10*3/uL (ref 0.0–0.1)
Immature Granulocytes: 0 %
Lymphocytes Absolute: 2 10*3/uL (ref 0.7–3.1)
Lymphs: 36 %
MCH: 31.2 pg (ref 26.6–33.0)
MCHC: 32.9 g/dL (ref 31.5–35.7)
MCV: 95 fL (ref 79–97)
Monocytes Absolute: 0.5 10*3/uL (ref 0.1–0.9)
Monocytes: 8 %
Neutrophils Absolute: 3 10*3/uL (ref 1.4–7.0)
Neutrophils: 54 %
Platelets: 297 10*3/uL (ref 150–450)
RBC: 3.88 x10E6/uL (ref 3.77–5.28)
RDW: 13.8 % (ref 11.7–15.4)
WBC: 5.5 10*3/uL (ref 3.4–10.8)

## 2021-06-16 LAB — LIPID PANEL
Cholesterol: 104 mg/dL (ref <200)
HDL: 38 mg/dL — ABNORMAL LOW (ref >=50)
LDL Cholesterol (Calc): 48 mg/dL (calc)
Non-HDL Cholesterol (Calc): 66 mg/dL (calc) (ref <130)
Total CHOL/HDL Ratio: 2.7 (calc) (ref <5.0)
Triglycerides: 97 mg/dL (ref <150)

## 2021-06-16 LAB — RPR: RPR Ser Ql: NONREACTIVE

## 2021-06-16 LAB — HIV-1 RNA QUANT-NO REFLEX-BLD
HIV 1 RNA Quant: NOT DETECTED copies/mL
HIV-1 RNA Quant, Log: NOT DETECTED Log copies/mL

## 2021-06-19 LAB — T-HELPER CELLS (CD4) COUNT (NOT AT ARMC)

## 2021-06-22 DIAGNOSIS — E559 Vitamin D deficiency, unspecified: Secondary | ICD-10-CM | POA: Diagnosis not present

## 2021-06-22 DIAGNOSIS — E039 Hypothyroidism, unspecified: Secondary | ICD-10-CM | POA: Diagnosis not present

## 2021-06-22 DIAGNOSIS — G43919 Migraine, unspecified, intractable, without status migrainosus: Secondary | ICD-10-CM | POA: Diagnosis not present

## 2021-06-22 DIAGNOSIS — R1013 Epigastric pain: Secondary | ICD-10-CM | POA: Diagnosis not present

## 2021-06-22 DIAGNOSIS — I10 Essential (primary) hypertension: Secondary | ICD-10-CM | POA: Diagnosis not present

## 2021-06-22 DIAGNOSIS — E782 Mixed hyperlipidemia: Secondary | ICD-10-CM | POA: Diagnosis not present

## 2021-07-06 DIAGNOSIS — F333 Major depressive disorder, recurrent, severe with psychotic symptoms: Secondary | ICD-10-CM | POA: Diagnosis not present

## 2021-07-10 ENCOUNTER — Ambulatory Visit (HOSPITAL_COMMUNITY): Payer: BC Managed Care – PPO | Admitting: Licensed Clinical Social Worker

## 2021-07-10 NOTE — Progress Notes (Signed)
Therapist met with patient for session she was driving and therapist said can't do a session when driving for safety reasons. Patient said she was almost home so therapist waited about 5 minutes and sent a message through My Chart. Patient did not get back on the call.  ?

## 2021-07-13 DIAGNOSIS — F333 Major depressive disorder, recurrent, severe with psychotic symptoms: Secondary | ICD-10-CM | POA: Diagnosis not present

## 2021-07-27 DIAGNOSIS — F333 Major depressive disorder, recurrent, severe with psychotic symptoms: Secondary | ICD-10-CM | POA: Diagnosis not present

## 2021-08-22 ENCOUNTER — Ambulatory Visit (HOSPITAL_COMMUNITY): Payer: BC Managed Care – PPO | Admitting: Licensed Clinical Social Worker

## 2021-08-22 DIAGNOSIS — F333 Major depressive disorder, recurrent, severe with psychotic symptoms: Secondary | ICD-10-CM | POA: Diagnosis not present

## 2021-08-22 NOTE — Progress Notes (Signed)
Therapist contacted patient through My Chart for session and she did not respond session is a no show ?

## 2021-08-24 ENCOUNTER — Ambulatory Visit (INDEPENDENT_AMBULATORY_CARE_PROVIDER_SITE_OTHER): Payer: BC Managed Care – PPO | Admitting: Licensed Clinical Social Worker

## 2021-08-24 DIAGNOSIS — F200 Paranoid schizophrenia: Secondary | ICD-10-CM | POA: Diagnosis not present
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
## 2021-09-20 DIAGNOSIS — F333 Major depressive disorder, recurrent, severe with psychotic symptoms: Secondary | ICD-10-CM | POA: Diagnosis not present

## 2021-09-24 DIAGNOSIS — Z79899 Other long term (current) drug therapy: Secondary | ICD-10-CM | POA: Diagnosis not present

## 2021-09-25 DIAGNOSIS — F333 Major depressive disorder, recurrent, severe with psychotic symptoms: Secondary | ICD-10-CM | POA: Diagnosis not present

## 2021-10-12 ENCOUNTER — Ambulatory Visit (INDEPENDENT_AMBULATORY_CARE_PROVIDER_SITE_OTHER): Payer: BC Managed Care – PPO | Admitting: Licensed Clinical Social Worker

## 2021-10-12 DIAGNOSIS — F200 Paranoid schizophrenia: Secondary | ICD-10-CM | POA: Diagnosis not present
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
## 2021-11-09 DIAGNOSIS — F333 Major depressive disorder, recurrent, severe with psychotic symptoms: Secondary | ICD-10-CM | POA: Diagnosis not present

## 2021-12-07 ENCOUNTER — Ambulatory Visit (INDEPENDENT_AMBULATORY_CARE_PROVIDER_SITE_OTHER): Payer: BC Managed Care – PPO | Admitting: Licensed Clinical Social Worker

## 2021-12-07 DIAGNOSIS — F200 Paranoid schizophrenia: Secondary | ICD-10-CM
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
## 2021-12-14 DIAGNOSIS — F333 Major depressive disorder, recurrent, severe with psychotic symptoms: Secondary | ICD-10-CM | POA: Diagnosis not present

## 2022-01-10 ENCOUNTER — Ambulatory Visit (INDEPENDENT_AMBULATORY_CARE_PROVIDER_SITE_OTHER): Payer: BC Managed Care – PPO | Admitting: Licensed Clinical Social Worker

## 2022-01-10 DIAGNOSIS — F200 Paranoid schizophrenia: Secondary | ICD-10-CM | POA: Diagnosis not present
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
## 2022-01-15 DIAGNOSIS — F333 Major depressive disorder, recurrent, severe with psychotic symptoms: Secondary | ICD-10-CM | POA: Diagnosis not present

## 2022-02-13 ENCOUNTER — Encounter (HOSPITAL_COMMUNITY): Payer: Self-pay

## 2022-02-13 ENCOUNTER — Ambulatory Visit (HOSPITAL_COMMUNITY): Payer: BC Managed Care – PPO | Admitting: Licensed Clinical Social Worker

## 2022-02-13 NOTE — Progress Notes (Signed)
Technological difficulties video connection did not work.  Talked to patient about phone visit she may have to pay patient does not want to have to pay so canceled visit

## 2022-03-05 DIAGNOSIS — F333 Major depressive disorder, recurrent, severe with psychotic symptoms: Secondary | ICD-10-CM | POA: Diagnosis not present

## 2022-03-06 ENCOUNTER — Other Ambulatory Visit: Payer: Self-pay | Admitting: Internal Medicine

## 2022-03-06 DIAGNOSIS — Z1231 Encounter for screening mammogram for malignant neoplasm of breast: Secondary | ICD-10-CM

## 2022-03-15 DIAGNOSIS — J22 Unspecified acute lower respiratory infection: Secondary | ICD-10-CM | POA: Diagnosis not present

## 2022-03-15 DIAGNOSIS — U071 COVID-19: Secondary | ICD-10-CM | POA: Diagnosis not present

## 2022-03-22 DIAGNOSIS — E039 Hypothyroidism, unspecified: Secondary | ICD-10-CM | POA: Diagnosis not present

## 2022-03-22 DIAGNOSIS — U071 COVID-19: Secondary | ICD-10-CM | POA: Diagnosis not present

## 2022-03-22 DIAGNOSIS — E782 Mixed hyperlipidemia: Secondary | ICD-10-CM | POA: Diagnosis not present

## 2022-03-22 DIAGNOSIS — I10 Essential (primary) hypertension: Secondary | ICD-10-CM | POA: Diagnosis not present

## 2022-03-22 DIAGNOSIS — R1013 Epigastric pain: Secondary | ICD-10-CM | POA: Diagnosis not present

## 2022-03-22 DIAGNOSIS — E559 Vitamin D deficiency, unspecified: Secondary | ICD-10-CM | POA: Diagnosis not present

## 2022-03-27 DIAGNOSIS — E782 Mixed hyperlipidemia: Secondary | ICD-10-CM | POA: Diagnosis not present

## 2022-03-27 DIAGNOSIS — E039 Hypothyroidism, unspecified: Secondary | ICD-10-CM | POA: Diagnosis not present

## 2022-03-27 DIAGNOSIS — E559 Vitamin D deficiency, unspecified: Secondary | ICD-10-CM | POA: Diagnosis not present

## 2022-04-29 ENCOUNTER — Ambulatory Visit
Admission: RE | Admit: 2022-04-29 | Discharge: 2022-04-29 | Disposition: A | Discharge status: Home / Self Care | Payer: BC Managed Care – PPO | Source: Ambulatory Visit | Attending: Internal Medicine | Admitting: Internal Medicine

## 2022-04-29 DIAGNOSIS — Z1231 Encounter for screening mammogram for malignant neoplasm of breast: Secondary | ICD-10-CM | POA: Diagnosis not present

## 2022-05-06 DIAGNOSIS — F333 Major depressive disorder, recurrent, severe with psychotic symptoms: Secondary | ICD-10-CM | POA: Diagnosis not present

## 2022-05-14 DIAGNOSIS — F333 Major depressive disorder, recurrent, severe with psychotic symptoms: Secondary | ICD-10-CM | POA: Diagnosis not present

## 2022-05-20 ENCOUNTER — Other Ambulatory Visit: Payer: Self-pay | Admitting: Infectious Disease

## 2022-05-20 DIAGNOSIS — B20 Human immunodeficiency virus [HIV] disease: Secondary | ICD-10-CM

## 2022-05-30 ENCOUNTER — Ambulatory Visit: Payer: BC Managed Care – PPO | Admitting: Infectious Disease

## 2022-05-31 ENCOUNTER — Telehealth: Payer: Self-pay

## 2022-05-31 NOTE — Telephone Encounter (Signed)
-----   Message from Sandie Ano, RN sent at 05/20/2022  1:53 PM EDT ----- Patient needs appointment, sending in 20 day supply of meds. Thanks!

## 2022-05-31 NOTE — Telephone Encounter (Signed)
Voice mail is full, patient needs to schedule a follow up with Dr. Daiva Eves

## 2022-05-31 NOTE — Telephone Encounter (Signed)
-----   Message from Megan D Burroughs, RN sent at 05/20/2022  1:53 PM EDT ----- Patient needs appointment, sending in 20 day supply of meds. Thanks!  

## 2022-05-31 NOTE — Telephone Encounter (Signed)
Patient returned call, will call back when she's ready to schedule office visit.

## 2022-06-21 ENCOUNTER — Other Ambulatory Visit: Payer: Self-pay

## 2022-06-21 ENCOUNTER — Other Ambulatory Visit: Payer: Self-pay | Admitting: Infectious Disease

## 2022-06-21 DIAGNOSIS — B20 Human immunodeficiency virus [HIV] disease: Secondary | ICD-10-CM

## 2022-06-21 MED ORDER — DOVATO 50-300 MG PO TABS
ORAL_TABLET | ORAL | 0 refills | Status: DC
Start: 2022-06-21 — End: 2022-08-12

## 2022-07-10 DIAGNOSIS — F333 Major depressive disorder, recurrent, severe with psychotic symptoms: Secondary | ICD-10-CM | POA: Diagnosis not present

## 2022-08-09 ENCOUNTER — Other Ambulatory Visit: Payer: Self-pay | Admitting: Infectious Disease

## 2022-08-09 DIAGNOSIS — B20 Human immunodeficiency virus [HIV] disease: Secondary | ICD-10-CM

## 2022-08-09 NOTE — Telephone Encounter (Signed)
Pt overdue for appt. Will send mychart message.

## 2022-09-03 ENCOUNTER — Ambulatory Visit: Payer: BC Managed Care – PPO | Admitting: Infectious Disease

## 2022-09-26 DIAGNOSIS — F333 Major depressive disorder, recurrent, severe with psychotic symptoms: Secondary | ICD-10-CM | POA: Diagnosis not present

## 2022-11-08 DIAGNOSIS — F333 Major depressive disorder, recurrent, severe with psychotic symptoms: Secondary | ICD-10-CM | POA: Diagnosis not present

## 2022-11-20 DIAGNOSIS — Z131 Encounter for screening for diabetes mellitus: Secondary | ICD-10-CM | POA: Diagnosis not present

## 2022-11-20 DIAGNOSIS — E039 Hypothyroidism, unspecified: Secondary | ICD-10-CM | POA: Diagnosis not present

## 2022-11-20 DIAGNOSIS — I10 Essential (primary) hypertension: Secondary | ICD-10-CM | POA: Diagnosis not present

## 2022-11-20 DIAGNOSIS — R1013 Epigastric pain: Secondary | ICD-10-CM | POA: Diagnosis not present

## 2022-11-20 DIAGNOSIS — E782 Mixed hyperlipidemia: Secondary | ICD-10-CM | POA: Diagnosis not present

## 2022-11-20 DIAGNOSIS — Z0000 Encounter for general adult medical examination without abnormal findings: Secondary | ICD-10-CM | POA: Diagnosis not present

## 2022-12-04 DIAGNOSIS — I10 Essential (primary) hypertension: Secondary | ICD-10-CM | POA: Diagnosis not present

## 2022-12-04 DIAGNOSIS — Z79899 Other long term (current) drug therapy: Secondary | ICD-10-CM | POA: Diagnosis not present

## 2022-12-04 DIAGNOSIS — E039 Hypothyroidism, unspecified: Secondary | ICD-10-CM | POA: Diagnosis not present

## 2022-12-04 DIAGNOSIS — R1013 Epigastric pain: Secondary | ICD-10-CM | POA: Diagnosis not present

## 2022-12-04 DIAGNOSIS — E782 Mixed hyperlipidemia: Secondary | ICD-10-CM | POA: Diagnosis not present

## 2022-12-09 DIAGNOSIS — F333 Major depressive disorder, recurrent, severe with psychotic symptoms: Secondary | ICD-10-CM | POA: Diagnosis not present

## 2022-12-18 DIAGNOSIS — F333 Major depressive disorder, recurrent, severe with psychotic symptoms: Secondary | ICD-10-CM | POA: Diagnosis not present

## 2022-12-26 DIAGNOSIS — F333 Major depressive disorder, recurrent, severe with psychotic symptoms: Secondary | ICD-10-CM | POA: Diagnosis not present

## 2023-01-02 DIAGNOSIS — Z01411 Encounter for gynecological examination (general) (routine) with abnormal findings: Secondary | ICD-10-CM | POA: Diagnosis not present

## 2023-01-02 DIAGNOSIS — B3731 Acute candidiasis of vulva and vagina: Secondary | ICD-10-CM | POA: Diagnosis not present

## 2023-01-02 DIAGNOSIS — R87618 Other abnormal cytological findings on specimens from cervix uteri: Secondary | ICD-10-CM | POA: Diagnosis not present

## 2023-01-02 DIAGNOSIS — B20 Human immunodeficiency virus [HIV] disease: Secondary | ICD-10-CM | POA: Diagnosis not present

## 2023-01-02 DIAGNOSIS — Z113 Encounter for screening for infections with a predominantly sexual mode of transmission: Secondary | ICD-10-CM | POA: Diagnosis not present

## 2023-01-03 DIAGNOSIS — F333 Major depressive disorder, recurrent, severe with psychotic symptoms: Secondary | ICD-10-CM | POA: Diagnosis not present

## 2023-01-07 DIAGNOSIS — Z113 Encounter for screening for infections with a predominantly sexual mode of transmission: Secondary | ICD-10-CM | POA: Diagnosis not present

## 2023-01-10 DIAGNOSIS — Z3046 Encounter for surveillance of implantable subdermal contraceptive: Secondary | ICD-10-CM | POA: Diagnosis not present

## 2023-01-21 ENCOUNTER — Ambulatory Visit: Payer: BC Managed Care – PPO | Admitting: Infectious Disease

## 2023-01-21 ENCOUNTER — Ambulatory Visit: Payer: BC Managed Care – PPO

## 2023-02-11 NOTE — Progress Notes (Unsigned)
HPI: Adriana Fernandez is a 52 y.o. female who presents to the [facility] pharmacy clinic for HIV follow-up.  Patient Active Problem List   Diagnosis Date Noted   S/P cholecystectomy 05/07/2021   Hypertension 05/07/2021   Body mass index (BMI) 31.0-31.9, adult 05/07/2021   Prediabetes 05/07/2021   Vitamin D deficiency 05/07/2021   Routine screening for STI (sexually transmitted infection) 05/07/2021   Amenorrhea 04/08/2021   Pap smear abnormality of cervix/human papillomavirus (HPV) positive 09/27/2020   Biliary colic 07/17/2017   Hypokalemia 06/20/2016   Cholelithiasis with acute cholecystitis 06/20/2016   Choledocholithiasis with acute cholecystitis with obstruction 06/20/2016   HSV infection 07/26/2014   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   BENZTROPINE (COGENTIN) 0.5 MG TABLET    Take 0.5 mg by mouth 2 (two) times daily.   BUPROPION (WELLBUTRIN SR) 150 MG 12 HR TABLET    Take 1 tablet (150 mg total) by mouth 2 (two) times daily.   DICYCLOMINE (BENTYL) 20 MG TABLET    Take 1 tablet (20 mg total) by mouth 2 (two) times daily.   DOVATO 50-300 MG TABLET    TAKE 1 TABLET BY MOUTH 1 TIME A DAY   ESOMEPRAZOLE (NEXIUM) 40 MG CAPSULE    Take 40 mg by mouth 2 (two) times daily.   FLUCONAZOLE (DIFLUCAN) 150 MG TABLET    Take 150 mg by mouth once.   IBUPROFEN (ADVIL,MOTRIN) 600 MG TABLET    Take 1 tablet (600 mg total) by mouth every 6 (six) hours as needed.   LATUDA 20 MG TABS TABLET    TAKE 1 TABLET BY MOUTH AT BEDTIME WITY H350  CALORIES OF FOOD DO NOT DRIVE OR OPERATE MACHINERY. DO NOT USE ALCOHOL   METRONIDAZOLE (METROGEL) 0.75 % VAGINAL GEL    SMARTSIG:1 Vaginal 4-5 Times Daily   ONDANSETRON (ZOFRAN ODT) 8 MG DISINTEGRATING TABLET    8mg  ODT q8 hours prn nausea   PALIPERIDONE (INVEGA) 3 MG 24 HR TABLET    Take 3 mg by mouth daily.    PHENTERMINE (ADIPEX-P) 37.5 MG TABLET    TAKE 1 2 (ONE HALF) TABLET BY MOUTH ONCE DAILY   POTASSIUM CHLORIDE SA (KLOR-CON M) 20 MEQ TABLET    Take 2 tablets (40 mEq total) by mouth 2 (two) times daily for 7 days.   RISPERIDONE (RISPERDAL PO)    Take by mouth.   VALACYCLOVIR (VALTREX) 1000 MG TABLET    Take 1,000 mg by mouth daily.  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  Other Topics Concern   Not on file  Social History Narrative   Not on file   Social Drivers of Health   Financial Resource Strain: Not on file  Food Insecurity: Not on file  Transportation Needs: Not on file  Physical Activity: Not  on file  Stress: Not on file  Social Connections: Not on file    Labs: Lab Results  Component Value Date   HIV1RNAQUANT NOT DETECTED 06/13/2021   HIV1RNAQUANT Not Detected 05/07/2021   HIV1RNAQUANT <20 01/11/2020   HIV1RNAVL <40 06/26/2011   CD4TABS 788 01/11/2020   CD4TABS 600 08/04/2017   CD4TABS 670 04/21/2017    RPR and STI Lab Results  Component Value Date   LABRPR NON-REACTIVE 06/13/2021   LABRPR NON-REACTIVE 05/07/2021   LABRPR NON-REACTIVE 01/11/2020   LABRPR NON-REACTIVE 11/10/2018   LABRPR NON-REACTIVE 08/04/2017    STI Results GC CT  06/13/2021  1:57 PM Negative  Negative   01/11/2020  8:27 AM Negative  Negative   11/10/2018 12:00 AM Negative  Negative   08/04/2017 12:00 AM Negative  Negative   04/21/2017 12:00 AM Negative  Negative   04/11/2016 12:00 AM Negative  Negative   09/29/2015 12:00 AM Negative  Negative   12/30/2014 12:00 AM Negative  Negative   07/26/2014 12:00 AM Negative  Negative     Hepatitis B Lab Results  Component Value Date   HEPBSAB NO 04/21/2006   HEPBSAG NEGATIVE 06/04/2011   Hepatitis C No results found for: "HEPCAB", "HCVRNAPCRQN" Hepatitis A No results found for: "HAV" Lipids: Lab Results  Component Value Date   CHOL 104 06/13/2021   TRIG 97 06/13/2021   HDL 38 (L) 06/13/2021   CHOLHDL 2.7 06/13/2021   VLDL 17 04/11/2016   LDLCALC 48 06/13/2021    Current HIV Regimen: Dovato (last filled in June)  Assessment: Alease presents to clinic today for HIV follow-up after multiple missed visits with Dr. Daiva Eves; she last followed with him in April 2023. States she missed visits because she did not have enough money for copays. She states she took a leave of absence for ~1 month due to depression in the last couple years. Per Dr. Daiva Eves, she is an elite controller as she has maintained a perfectly suppressed virus despite long periods off of medication. Last filled Dovato in June. 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 Reviewed the risk for increased inflammation when off of ART even with being an elite controller. Will check HIV RNA today along with routine labs including CMP, CBC, CD4 count, lipid profile, RPR, and urine cytologies. States she has been sexually active with a previous partner recently.   She states she stopped taking the Dovato because her diarrhea worsened when she initiated treatment. States she has chronic diarrhea from  IBS and previous gallbladder surgery but that it has been at its worse since starting Dovato. Even with stopping Dovato, it has continued to a somewhat lesser degree. We discussed transitioning back to [facility] as she seemed to tolerate it very well without any side effects. Reviewed that the diarrhea is likely multifactorial but that it could certainly be worsened by inflammation from HIV. She is motivated to take [place] daily should it possibly alleviate some of her diarrhea. States that Imodium does not help. She already follows with Dr. Marisue [facility]), so I encouraged her to set up another visit with them for further management. Will send Biktarvy prescription to [facility]; Toni Amend obtained a copay card for her today to ensure her copay will be $0. Provided her with 2 weeks of samples due to potential for shipping delay during the holidays. Will follow up with Dr. Daiva Eves in ~6 weeks.   Due for flu, COVID, Menveo, and Tdap vaccines. States she received her flu shot from [facility] this fall. Declines COVID but accepts Menveo and Tdap vaccines today. Will recheck hepatitis A and B immunity today before considering need for vaccination.   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 Daiva Eves on 04/01/23   Margarite Gouge, PharmD, CPP,  BCIDP, AAHIVP Clinical Pharmacist Practitioner Infectious Diseases Clinical Pharmacist [facility] Infect[facility]

## 2023-02-12 ENCOUNTER — Other Ambulatory Visit: Payer: Self-pay

## 2023-02-12 ENCOUNTER — Other Ambulatory Visit (HOSPITAL_COMMUNITY): Payer: Self-pay

## 2023-02-12 ENCOUNTER — Other Ambulatory Visit (HOSPITAL_COMMUNITY)
Admission: RE | Admit: 2023-02-12 | Discharge: 2023-02-12 | Disposition: A | Discharge status: Home / Self Care | Payer: BC Managed Care – PPO | Source: Ambulatory Visit | Attending: Infectious Disease | Admitting: Infectious Disease

## 2023-02-12 ENCOUNTER — Ambulatory Visit (INDEPENDENT_AMBULATORY_CARE_PROVIDER_SITE_OTHER): Payer: BC Managed Care – PPO | Admitting: Pharmacist

## 2023-02-12 ENCOUNTER — Telehealth: Payer: Self-pay

## 2023-02-12 DIAGNOSIS — B20 Human immunodeficiency virus [HIV] disease: Secondary | ICD-10-CM

## 2023-02-12 DIAGNOSIS — Z113 Encounter for screening for infections with a predominantly sexual mode of transmission: Secondary | ICD-10-CM | POA: Diagnosis not present

## 2023-02-12 DIAGNOSIS — Z23 Encounter for immunization: Secondary | ICD-10-CM

## 2023-02-12 MED ORDER — BICTEGRAVIR-EMTRICITAB-TENOFOV 50-200-25 MG PO TABS: 1.0000 | ORAL_TABLET | Freq: Every day | ORAL | 2 refills | Status: DC

## 2023-02-12 NOTE — Telephone Encounter (Signed)
[facility] Patient Advocate Encounter   Was successful in obtaining a [employer] copay card for [facility].  This copay card will make the patients copay $0.  I have spoken with the patient.    The billing information is as follows and has been shared with [facility].  RxBin: [id number] PCN: [id number] Member ID: [id number] Group ID: [id number]    Kae Heller, CPhT Specialty Pharmacy Patient [facility] for Infectious Disease Phone: [phone] Fax:  [phone]

## 2023-02-14 ENCOUNTER — Other Ambulatory Visit: Payer: Self-pay | Admitting: Pharmacist

## 2023-02-14 LAB — CBC WITH DIFFERENTIAL/PLATELET
Absolute Lymphocytes: 1685 {cells}/uL (ref 850–3900)
Absolute Monocytes: 565 {cells}/uL (ref 200–950)
Basophils Absolute: 30 {cells}/uL (ref 0–200)
Basophils Relative: 0.6 %
Eosinophils Absolute: 60 {cells}/uL (ref 15–500)
Eosinophils Relative: 1.2 %
HCT: 40.2 % (ref 35.0–45.0)
Hemoglobin: 13.2 g/dL (ref 11.7–15.5)
MCH: 29.1 pg (ref 27.0–33.0)
MCHC: 32.8 g/dL (ref 32.0–36.0)
MCV: 88.5 fL (ref 80.0–100.0)
MPV: 10.7 fL (ref 7.5–12.5)
Monocytes Relative: 11.3 %
Neutro Abs: 2660 {cells}/uL (ref 1500–7800)
Neutrophils Relative %: 53.2 %
Platelets: 231 10*3/uL (ref 140–400)
RBC: 4.54 10*6/uL (ref 3.80–5.10)
RDW: 13.8 % (ref 11.0–15.0)
Total Lymphocyte: 33.7 %
WBC: 5 10*3/uL (ref 3.8–10.8)

## 2023-02-14 LAB — COMPREHENSIVE METABOLIC PANEL WITH GFR
AG Ratio: 1.3 (calc) (ref 1.0–2.5)
ALT: 17 U/L (ref 6–29)
AST: 16 U/L (ref 10–35)
Albumin: 4.4 g/dL (ref 3.6–5.1)
Alkaline phosphatase (APISO): 114 U/L (ref 37–153)
BUN: 16 mg/dL (ref 7–25)
CO2: 30 mmol/L (ref 20–32)
Calcium: 9.8 mg/dL (ref 8.6–10.4)
Chloride: 102 mmol/L (ref 98–110)
Creat: 0.92 mg/dL (ref 0.50–1.03)
Globulin: 3.3 g/dL (ref 1.9–3.7)
Glucose, Bld: 101 mg/dL — ABNORMAL HIGH (ref 65–99)
Potassium: 3.8 mmol/L (ref 3.5–5.3)
Sodium: 139 mmol/L (ref 135–146)
Total Bilirubin: 0.4 mg/dL (ref 0.2–1.2)
Total Protein: 7.7 g/dL (ref 6.1–8.1)

## 2023-02-14 LAB — URINE CYTOLOGY ANCILLARY ONLY
Chlamydia: NEGATIVE
Comment: NEGATIVE
Comment: NORMAL
Neisseria Gonorrhea: NEGATIVE

## 2023-02-14 LAB — T-HELPER CELLS (CD4) COUNT (NOT AT ARMC)
Absolute CD4: 943 {cells}/uL (ref 490–1740)
CD4 T Helper %: 53 % (ref 30–61)
Total lymphocyte count: 1769 {cells}/uL (ref 850–3900)

## 2023-02-14 LAB — LIPID PANEL
Cholesterol: 96 mg/dL
HDL: 33 mg/dL — ABNORMAL LOW
LDL Cholesterol (Calc): 43 mg/dL
Non-HDL Cholesterol (Calc): 63 mg/dL
Total CHOL/HDL Ratio: 2.9 (calc)
Triglycerides: 113 mg/dL

## 2023-02-14 LAB — RPR: RPR Ser Ql: NONREACTIVE

## 2023-02-14 LAB — HIV-1 RNA QUANT-NO REFLEX-BLD
HIV 1 RNA Quant: NOT DETECTED {copies}/mL
HIV-1 RNA Quant, Log: NOT DETECTED {Log}

## 2023-02-14 LAB — HEPATITIS A ANTIBODY, TOTAL: Hepatitis A AB,Total: NONREACTIVE

## 2023-02-14 LAB — HEPATITIS B SURFACE ANTIBODY,QUALITATIVE: Hep B S Ab: REACTIVE — AB

## 2023-02-14 MED ORDER — BICTEGRAVIR-EMTRICITAB-TENOFOV 50-200-25 MG PO TABS: 1.0000 | ORAL_TABLET | Freq: Every day | ORAL | Status: AC

## 2023-02-14 NOTE — Progress Notes (Signed)
Medication Samples have been provided to the patient.  Drug name: Biktarvy        Strength: 50/200/25 mg       Qty: 14 tablets (2 bottles) LOT: CSCFVA   Exp.Date: 10/26  Dosing instructions: Take one tablet by mouth once daily  The patient has been instructed regarding the correct time, dose, and frequency of taking this medication, including desired effects and most common side effects.   Margarite Gouge, PharmD, CPP, BCIDP, AAHIVP Clinical Pharmacist Practitioner Infectious Diseases Clinical Pharmacist [facility] for Infectious Disease

## 2023-02-27 DIAGNOSIS — F250 Schizoaffective disorder, bipolar type: Secondary | ICD-10-CM | POA: Diagnosis not present

## 2023-03-11 DIAGNOSIS — K5730 Diverticulosis of large intestine without perforation or abscess without bleeding: Secondary | ICD-10-CM | POA: Diagnosis not present

## 2023-03-11 DIAGNOSIS — I10 Essential (primary) hypertension: Secondary | ICD-10-CM | POA: Diagnosis not present

## 2023-03-11 DIAGNOSIS — R194 Change in bowel habit: Secondary | ICD-10-CM | POA: Diagnosis not present

## 2023-03-11 DIAGNOSIS — K219 Gastro-esophageal reflux disease without esophagitis: Secondary | ICD-10-CM | POA: Diagnosis not present

## 2023-03-28 DIAGNOSIS — F250 Schizoaffective disorder, bipolar type: Secondary | ICD-10-CM | POA: Diagnosis not present

## 2023-03-31 ENCOUNTER — Encounter: Payer: Self-pay | Admitting: Infectious Disease

## 2023-03-31 DIAGNOSIS — Z7185 Encounter for immunization safety counseling: Secondary | ICD-10-CM

## 2023-03-31 HISTORY — DX: Encounter for immunization safety counseling: Z71.85

## 2023-03-31 NOTE — Progress Notes (Signed)
 Chief complaint follow-up for HIV disease on medications  Subjective:    Patient ID: Adriana Fernandez, female    DOB: 1970/12/31, 53 y.o.   MRN: [id number]  HPI  Discussed the use of AI scribe software for clinical note transcription with the patient, who gave verbal consent to proceed.  History of Present Illness   The patient, with a history of HIV and gallbladder removal, presents with ongoing diarrhea. She reports that the diarrhea has worsened since her gallbladder was removed. She initially attributed the diarrhea to her HIV medication, Dovato , but has since switched to Biktarvy  without resolution of symptoms. She is otherwise doing well with an undetectable viral load. She also reports a recent move to a new apartment closer to the clinic.       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   Hypertension    Paranoid schizophrenia (HCC)    Psychosis (HCC)    Schizophrenia (HCC)    Sickle cell trait (HCC)    Vaccine counseling 03/31/2023    Past Surgical History:  Procedure Laterality Date   CHOLECYSTECTOMY N/A 06/22/2016   Procedure: LAPAROSCOPIC CHOLECYSTECTOMY WITH INTRAOPERATIVE CHOLANGIOGRAM;  Surgeon: Alm Angle, MD;  Location: [facility];  Service: General;  Laterality: N/A;   ERCP N/A 06/21/2016   Procedure: ENDOSCOPIC RETROGRADE CHOLANGIOPANCREATOGRAPHY (ERCP);  Surgeon: Lupita FORBES Commander, MD;  Location: [facility];  Service: Endoscopy;  Laterality: N/A;    Family History  Problem Relation Age of Onset   GER disease Mother    Hypertension Mother    Sickle cell anemia Brother    Diabetes Maternal Aunt    Diabetes Maternal Grandmother    Breast cancer Maternal Aunt    Throat cancer Paternal Grandmother       Social History   Socioeconomic History   Marital status: Divorced    Spouse  name: Not on file   Number of children: Not on file   Years of education: Not on file   Highest education level: Not on file  Occupational History   Not on file  Tobacco Use   Smoking status: Never   Smokeless tobacco: Never  Substance and Sexual Activity   Alcohol use: No   Drug use: No   Sexual activity: Yes    Partners: Male    Birth control/protection: I.U.D.  Other Topics Concern   Not on file  Social History Narrative   Not on file   Social Drivers of Health   Financial Resource Strain: Not on file  Food Insecurity: Not on file  Transportation Needs: Not on file  Physical Activity: Not on file  Stress: Not on file  Social Connections: Not on file    Allergies  Allergen Reactions   Dilaudid  [Hydromorphone  Hcl] Itching   Latex      Current Outpatient Medications:    atenolol-chlorthalidone (TENORETIC) 50-25 MG tablet, Take 1 tablet by mouth daily., Disp: , Rfl:    bictegravir-emtricitabine-tenofovir AF (BIKTARVY ) 50-200-25 MG TABS tablet, Take 1 tablet by mouth daily., Disp: 30 tablet, Rfl: 2   buPROPion  (WELLBUTRIN  SR) 150 MG 12 hr tablet, Take 1 tablet (150 mg total) by mouth 2 (two) times daily., Disp: 60 tablet, Rfl: 0   esomeprazole (NEXIUM) 40 MG capsule, Take 40 mg by mouth 2 (two) times  daily., Disp: , Rfl:    levothyroxine (SYNTHROID) 25 MCG tablet, Take 25 mcg by mouth daily before breakfast., Disp: , Rfl:    lurasidone (LATUDA) 40 MG TABS tablet, Take 40 mg by mouth daily with breakfast., Disp: , Rfl:    phentermine (ADIPEX-P) 37.5 MG tablet, TAKE 1 2 (ONE HALF) TABLET BY MOUTH ONCE DAILY, Disp: , Rfl:    simvastatin (ZOCOR) 20 MG tablet, Take 20 mg by mouth daily., Disp: , Rfl:    valACYclovir  (VALTREX ) 1000 MG tablet, Take 1,000 mg by mouth daily., Disp: , Rfl:    Review of Systems  Constitutional:  Negative for activity change, appetite change, chills, diaphoresis, fatigue, fever and unexpected weight change.  HENT:  Negative for congestion,  rhinorrhea, sinus pressure, sneezing, sore throat and trouble swallowing.   Eyes:  Negative for photophobia and visual disturbance.  Respiratory:  Negative for cough, chest tightness, shortness of breath, wheezing and stridor.   Cardiovascular:  Negative for chest pain, palpitations and leg swelling.  Gastrointestinal:  Positive for diarrhea. Negative for abdominal distention, abdominal pain, anal bleeding, blood in stool, constipation, nausea and vomiting.  Genitourinary:  Negative for difficulty urinating, dysuria, flank pain and hematuria.  Musculoskeletal:  Negative for arthralgias, back pain, gait problem, joint swelling and myalgias.  Skin:  Negative for color change, pallor, rash and wound.  Neurological:  Negative for dizziness, tremors, weakness and light-headedness.  Hematological:  Negative for adenopathy. Does not bruise/bleed easily.  Psychiatric/Behavioral:  Negative for agitation, behavioral problems, confusion, decreased concentration, dysphoric mood and sleep disturbance.        Objective:   Physical Exam Constitutional:      General: She is not in acute distress.    Appearance: Normal appearance. She is well-developed. She is not ill-appearing or diaphoretic.  HENT:     Head: Normocephalic and atraumatic.     Right Ear: Hearing and external ear normal.     Left Ear: Hearing and external ear normal.     Nose: No nasal deformity or rhinorrhea.  Eyes:     General: No scleral icterus.    Conjunctiva/sclera: Conjunctivae normal.     Right eye: Right conjunctiva is not injected.     Left eye: Left conjunctiva is not injected.     Pupils: Pupils are equal, round, and reactive to light.  Neck:     Vascular: No JVD.  Cardiovascular:     Rate and Rhythm: Normal rate and regular rhythm.     Heart sounds: S1 normal and S2 normal.  Pulmonary:     Effort: Pulmonary effort is normal. No respiratory distress.     Breath sounds: No wheezing.  Abdominal:     General: Bowel  sounds are normal. There is no distension.     Palpations: Abdomen is soft.     Tenderness: There is no abdominal tenderness.  Musculoskeletal:        General: Normal range of motion.     Right shoulder: Normal.     Left shoulder: Normal.     Cervical back: Normal range of motion and neck supple.     Right hip: Normal.     Left hip: Normal.     Right knee: Normal.     Left knee: Normal.  Lymphadenopathy:     Head:     Right side of head: No submandibular, preauricular or posterior auricular adenopathy.     Left side of head: No submandibular, preauricular or posterior auricular adenopathy.  Cervical: No cervical adenopathy.     Right cervical: No superficial or deep cervical adenopathy.    Left cervical: No superficial or deep cervical adenopathy.  Skin:    General: Skin is warm and dry.     Coloration: Skin is not pale.     Findings: No abrasion, bruising, ecchymosis, erythema, lesion or rash.     Nails: There is no clubbing.  Neurological:     General: No focal deficit present.     Mental Status: She is alert and oriented to person, place, and time.     Sensory: No sensory deficit.     Coordination: Coordination normal.     Gait: Gait normal.  Psychiatric:        Attention and Perception: She is attentive.        Mood and Affect: Mood normal.        Speech: Speech normal.        Behavior: Behavior normal. Behavior is cooperative.        Thought Content: Thought content normal.        Judgment: Judgment normal.           Assessment & Plan:   Assessment and Plan    HIV Elite controller status with undetectable viral load. Transitioned from Dovato  to Biktarvy  without issue. Discussed similarities between the two medications and patient preference to continue Biktarvy . -Continue Biktarvy . -Check viral load.  Diarrhea Ongoing issue, worsened after gallbladder removal. No current plan for management discussed. -No change in management at this  time.  Vaccinations Discussed need for shingles vaccine and COVID-19 vaccine. Patient agreed to shingles vaccine but declined COVID-19 vaccine. -Schedule nursing visit for shingles vaccine administration. -Continue to recommend COVID-19 vaccine at future visits.  Gynecological care Patient has been receiving care at a distant location and expressed interest in local care. -Referral to local OB-GYN for routine care.  Follow-up Discussed frequency of visits and agreed on a 75-month interval. -Schedule follow-up visit in 10 months.   Shizophrenia; on chronic medications for this     Hyperlipidemia; need to make sure she is on a statin at next visit  Hypothyroidism: on levothyroxine

## 2023-04-01 ENCOUNTER — Other Ambulatory Visit (HOSPITAL_COMMUNITY): Payer: Self-pay

## 2023-04-01 ENCOUNTER — Ambulatory Visit: Payer: BC Managed Care – PPO | Admitting: Infectious Disease

## 2023-04-01 ENCOUNTER — Other Ambulatory Visit: Payer: Self-pay

## 2023-04-01 ENCOUNTER — Encounter: Payer: Self-pay | Admitting: Infectious Disease

## 2023-04-01 ENCOUNTER — Telehealth: Payer: Self-pay

## 2023-04-01 VITALS — BP 131/83 | HR 68 | Temp 98.1°F | Ht 67.0 in | Wt 241.0 lb

## 2023-04-01 DIAGNOSIS — Z7185 Encounter for immunization safety counseling: Secondary | ICD-10-CM

## 2023-04-01 DIAGNOSIS — F200 Paranoid schizophrenia: Secondary | ICD-10-CM | POA: Diagnosis not present

## 2023-04-01 DIAGNOSIS — E785 Hyperlipidemia, unspecified: Secondary | ICD-10-CM

## 2023-04-01 DIAGNOSIS — R87618 Other abnormal cytological findings on specimens from cervix uteri: Secondary | ICD-10-CM | POA: Diagnosis not present

## 2023-04-01 DIAGNOSIS — E039 Hypothyroidism, unspecified: Secondary | ICD-10-CM

## 2023-04-01 DIAGNOSIS — Z113 Encounter for screening for infections with a predominantly sexual mode of transmission: Secondary | ICD-10-CM | POA: Diagnosis not present

## 2023-04-01 DIAGNOSIS — B20 Human immunodeficiency virus [HIV] disease: Secondary | ICD-10-CM

## 2023-04-01 MED ORDER — BICTEGRAVIR-EMTRICITAB-TENOFOV 50-200-25 MG PO TABS: 1.0000 | ORAL_TABLET | Freq: Every day | ORAL | 2 refills | Status: DC

## 2023-04-01 NOTE — Telephone Encounter (Signed)
 Per [facility] pharmacy team, Shingrix vaccine is covered at $0 under patient's pharmacy benefits. She will need to have this administered at a pharmacy though.   Called [facility] to let her know and cancel nurse visit, no answer and unable to leave message.   Koree Staheli D Kelon Easom, RN

## 2023-04-03 ENCOUNTER — Ambulatory Visit: Payer: BC Managed Care – PPO

## 2023-04-03 LAB — HIV-1 RNA QUANT-NO REFLEX-BLD
HIV 1 RNA Quant: 25 {copies}/mL — ABNORMAL HIGH
HIV-1 RNA Quant, Log: 1.4 {Log} — ABNORMAL HIGH

## 2023-04-21 ENCOUNTER — Other Ambulatory Visit: Payer: Self-pay | Admitting: Pharmacist

## 2023-04-21 DIAGNOSIS — B20 Human immunodeficiency virus [HIV] disease: Secondary | ICD-10-CM

## 2023-05-20 DIAGNOSIS — I10 Essential (primary) hypertension: Secondary | ICD-10-CM | POA: Diagnosis not present

## 2023-05-20 DIAGNOSIS — F250 Schizoaffective disorder, bipolar type: Secondary | ICD-10-CM | POA: Diagnosis not present

## 2023-05-20 DIAGNOSIS — E559 Vitamin D deficiency, unspecified: Secondary | ICD-10-CM | POA: Diagnosis not present

## 2023-05-20 DIAGNOSIS — E782 Mixed hyperlipidemia: Secondary | ICD-10-CM | POA: Diagnosis not present

## 2023-05-20 DIAGNOSIS — E039 Hypothyroidism, unspecified: Secondary | ICD-10-CM | POA: Diagnosis not present

## 2023-05-20 DIAGNOSIS — R1013 Epigastric pain: Secondary | ICD-10-CM | POA: Diagnosis not present

## 2023-06-25 NOTE — Progress Notes (Signed)
 The ASCVD Risk score (Arnett DK, et al., 2019) failed to calculate for the following reasons:   The valid total cholesterol range is 130 to 320 mg/dL  Arlon Bergamo, BSN, RN

## 2023-07-18 DIAGNOSIS — F250 Schizoaffective disorder, bipolar type: Secondary | ICD-10-CM | POA: Diagnosis not present

## 2023-08-12 DIAGNOSIS — Z113 Encounter for screening for infections with a predominantly sexual mode of transmission: Secondary | ICD-10-CM | POA: Diagnosis not present

## 2023-08-12 DIAGNOSIS — B9689 Other specified bacterial agents as the cause of diseases classified elsewhere: Secondary | ICD-10-CM | POA: Diagnosis not present

## 2023-08-12 DIAGNOSIS — N760 Acute vaginitis: Secondary | ICD-10-CM | POA: Diagnosis not present

## 2023-08-13 DIAGNOSIS — F250 Schizoaffective disorder, bipolar type: Secondary | ICD-10-CM | POA: Diagnosis not present

## 2023-09-10 DIAGNOSIS — F250 Schizoaffective disorder, bipolar type: Secondary | ICD-10-CM | POA: Diagnosis not present

## 2023-10-31 ENCOUNTER — Other Ambulatory Visit: Payer: Self-pay | Admitting: Internal Medicine

## 2023-10-31 DIAGNOSIS — Z1231 Encounter for screening mammogram for malignant neoplasm of breast: Secondary | ICD-10-CM

## 2023-11-03 ENCOUNTER — Ambulatory Visit
Admission: RE | Admit: 2023-11-03 | Discharge: 2023-11-03 | Disposition: A | Discharge status: Home / Self Care | Payer: MEDICAID | Source: Ambulatory Visit | Attending: Internal Medicine | Admitting: Internal Medicine

## 2023-11-03 DIAGNOSIS — Z1231 Encounter for screening mammogram for malignant neoplasm of breast: Secondary | ICD-10-CM

## 2023-12-03 ENCOUNTER — Other Ambulatory Visit: Payer: Self-pay | Admitting: Infectious Disease

## 2023-12-03 DIAGNOSIS — B20 Human immunodeficiency virus [HIV] disease: Secondary | ICD-10-CM

## 2023-12-12 NOTE — Progress Notes (Signed)
 ------------------------------------------------------------------------------- Attestation signed by Gunnar Chris Daniels, MD at 12/13/2023  7:37 AM I have reviewed this patient's clinical documents.  I agree with the plan proposed by the working APP.   Gunnar HILARIO Daniels, MD  -------------------------------------------------------------------------------     GASTROENTEROLOGY OUTPATIENT CONSULTATION NOTE  REFERRING PHYSICIAN: Catalina Bare, MD  CC: Chronic diarrhea   HISTORY OF PRESENT ILLNESS: Adriana Fernandez is a 53 y.o. female with a significant PMHx including GERD, choledocholithiasis w/ obstruction and cholecystitis s/p CCY, HIV, depression, and schizophrenia who presents to the office today for further evaluation of the above. Their chart reviewed and we last saw pt in office on 07/17/17 (see note for more detail) - noted then to have increasing issues with diarrhea since her CCY 05/2016. Previously followed by Dr. Gunnar Daniels. Given epigastric pain and concern over possible PUD at that time pt was advised to undergo EGD (completed by undersigned 06/2017 - showing chronic gastritis - no H. Pylori infection), continue Nexium and Carafate , and to implement daily fiber and to consider possible bile acid sequestrant in the future.    Today, patient notes continued issues with loose stools ranging from [employer] type that she will go on average 4 times a day (worse depending on what she eats and better when fasting) additionally notes increasing fecal urgency and incontinence issues. Prior to her CCY in 2018 pt notes that she had intermittent issues with diarrhea for which she would take imodium for with benefit noted. Since her CCY pt notes iodium lost its effect, with short term relief noted with use of Ibgard along with Nexium, now taking OTC prilosec, with good control of her GERD. Notes colnonscopy in 02/2019 through Dr. Renaye Sous - (records release filled out and signed by pt) - with pt due for another in 10  years. Additionally, pt notes normal infectious stool studeies and pancreatic elastace through Dr. Nola office (will attain those records as well). Has yet to trial a bile acid sequestrant.  Notes that she lost her job with [employer] (copy) due to increased fecal incontinence issues and missing work. Denies issues with nocturnal stools, tenesmus, extraintestinal symptoms associated with IBD, overt GI bleeding, changes to medications, or recent antibiotic use. Noting that her stools are more loose and tend to float. She denies having any acute upper or lower GI complaints. The patient denies having any abdominal pain, nausea, vomiting. No heartburn, dysphagia. Weight and appetite stable. No AsA/NSAID use. No change in bowel habits, noting consistent diarrhea/loose stools per above. Denies constipation rectal bleeding or melena.    Labs 02/12/23: CBC, CMP, and lipids: reviewed and unremarkable  ALLERGIES: Allergies[1]  MEDICATIONS: Current Medications[2]  PAST MEDICAL HISTORY: Problem List[3]  PAST SURGICAL HISTORY: Surgical History[4]  SOCIAL HISTORY: Tobacco Use History[5] Social History   Substance and Sexual Activity  Alcohol Use No   Social History   Substance and Sexual Activity  Drug Use No    FAMILY HISTORY: Negative for colon cancer, colon polyps, ulcerative colitis, Crohn's disease, liver disease.  REVIEW OF SYSTEMS: A complete ROS of negative except those stated in HPI.  LABS: Pertinent labs per HPI  IMAGING:  Pertinent GI imaging per HPI  VITAL SIGNS:  Height: 1.702 m (5' 7.01) (12/12/2023 11:14 AM) Weight: 120 kg (264 lb) (12/12/2023 11:14 AM)  Body mass index is 41.34 kg/m.  Vitals:   12/12/23 1114  BP: 120/80  Resp: 14  Temp: 97.4 F (36.3 C)    PHYSICAL EXAM: Well developed, well  nourished.  No acute distress.   Skin warm and dry.  HEENT: Normocephalic, atraumatic. No scleral icterus.  No oropharyngeal lesions, mucosa  pink and moist. Neck: Normal to inspection. Supple. Lungs:Respiratory effort unlabored. Clear to auscultation.  Cardiac: Rhythm regular with no murmur Abdomen:  Active bowel sounds,soft, nondistended, nontender  Extremities without  clubbing, cyanosis, edema.  Musculoskeletal:  No gross motor deficits. Neurological: Alert and oriented x 3. Mood and affect appropriate.   ASSESSMENT: 1. S/P cholecystectomy   2. Incontinence of feces with fecal urgency   3. Gastroesophageal reflux disease, unspecified whether esophagitis present     No orders of the defined types were placed in this encounter.   PLAN: 1.) Records release for [facility] records completed today. Explained to pt that pending the results, we may need to perform a colonoscopy again (with bxs for [facility] if not done) this year vs. Waiting until 2031 when due. Also, explained that we would need to possibly have her complete stool studies if not completed recently (pancreatic fecal elastase, infectious stool studies). 2.) Pt will trial bile acid sequestrant (colestipol 2 grams BID) - Rx sent 3.) Advised pt to increase her daily fiber intake (either through diet or supplemental) 4.) Pending her response to the above along with records attained - explained to pt that we may need her to complete additional blood work, as well as further causes for her fecal incontinence. 5.) Will plan to f/u in 6 months or sooner if needed.   Thank you for allowing us  to participate in the care of this patient.        [1] Allergies Allergen Reactions  . Hydromorphone  Hcl Itching  . Latex Rash  [2]  Current Outpatient Medications:  .  acetaminophen  (TYLENOL ) 325 mg tablet, Take 650 mg by mouth every 6 (six) hours., Disp: , Rfl:  .  atenoloL-chlorthalidone (TENORETIC) 50-25 mg per tablet, Take 1 tablet by mouth Once Daily., Disp: , Rfl:  .  buPROPion  (WELLBUTRIN  SR) 150 mg 12 hr tablet, Take 150 mg by mouth Once Daily. Indications: depression  (Patient not taking: Reported on 08/12/2023), Disp: 30 tablet, Rfl: 0 .  colestipol (COLESTID) 1 gram tablet, Take 2 tablets (2 g total) by mouth 2 (two) times a day., Disp: 360 tablet, Rfl: 0 .  Dovato  50-300 mg tab per tablet, Take 1 tablet by mouth Once Daily., Disp: , Rfl:  .  esomeprazole (NexIUM) 40 mg DR capsule, 40 mg daily before breakfast., Disp: , Rfl: 0 .  fluconazole  (Diflucan ) 150 mg tablet, Take 1 tablet (150 mg total) by mouth every third day., Disp: 3 tablet, Rfl: 0 .  haloperidoL (HALDOL) 0.5 mg tablet, Take 0.5 mg by mouth nightly. (Patient not taking: Reported on 08/12/2023), Disp: , Rfl:  .  ibuprofen  (MOTRIN ) 600 mg tablet, Take 600 mg by mouth every 6 (six) hours as needed., Disp: 30 tablet, Rfl: 0 .  levothyroxine (SYNTHROID) 25 mcg tablet, , Disp: , Rfl:  .  lurasidone (LATUDA) 20 mg tablet, TAKE 1 TABLET AT BEDTIME WITH 350CAL OF FOOD, NO DRIVING OR OPERATING MACHINERY OR ALCOHOL, Disp: , Rfl:  .  metroNIDAZOLE  (METROGEL ) 0.75 % (37.5mg /5 gram) vaginal gel, Insert 1 Application into the vagina 2 (two) times weekly., Disp: , Rfl: 7 .  omeprazole (PriLOSEC) 40 mg DR capsule, Take 40 mg by mouth Once Daily., Disp: , Rfl:  .  oxyCODONE (ROXICODONE) 5 mg immediate release tablet, 5 mg every 4 (four) hours as needed for up to 5  doses. (Patient not taking: Reported on 08/12/2023), Disp: 5 tablet, Rfl: 0 .  phentermine 37.5 mg tab, Take 37.5 mg by mouth daily before breakfast. (Patient not taking: Reported on 08/12/2023), Disp: , Rfl:  .  simvastatin (ZOCOR) 20 mg tablet, take 1 tablet by mouth every day at bedtime for 90 days, Disp: , Rfl:  .  terconazole (TERAZOL 7) 0.4 % crea vaginal cream, Insert 1 applicator into the vagina nightly as needed., Disp: , Rfl:  .  valACYclovir  (VALTREX ) 1 gram tablet, Take 1,000 mg by mouth Once Daily., Disp: , Rfl:  [3] Patient Active Problem List Diagnosis  . Biliary colic  . Choledocholithiasis with acute cholecystitis with obstruction  .  Depression  . Human immunodeficiency virus (HIV) disease (HCC)  . Paranoid schizophrenia (HCC)  . Sickle cell trait (HCC)  . Pap smear abnormality of cervix/human papillomavirus (HPV) positive  . Encounter for tubal ligation counseling  . HSV-1 (herpes simplex virus 1) infection  . Migraine without aura, not refractory  . Amenorrhea  . Vitamin D deficiency  . S/P cholecystectomy  . Right lower quadrant pain  . Prediabetes  . Morbid obesity (HCC)  . Migraine  . Hypertension  . Gastroesophageal reflux disease  . Change in bowel habit  . Diarrhea  . Anxiety  . Alkaline phosphatase raised  . Status post bilateral salpingectomy  . Nexplanon removal  [4] Past Surgical History: Procedure Laterality Date  . CHOLECYSTECTOMY  2018   Procedure: CHOLECYSTECTOMY  . INTRAUTERINE DEVICE REMOVAL  05/16/2021   Procedure: EXAM UNDER ANESTHESIA; REMOVAL OF INTRAUTERINE DEVICE;  Surgeon: Darryle Devonna Pun, MD;  Location: [facility];  Service: Gynecology;;  . SALPINGECTOMY Bilateral 05/16/2021   Procedure: SALPINGECTOMY LAPAROSCOPIC; LYSIS OF ADHESIONS;  Surgeon: Darryle Devonna Pun, MD;  Location: [facility];  Service: Gynecology;  Laterality: Bilateral;  [5] Social History Tobacco Use  Smoking Status Never  . Passive exposure: Never  Smokeless Tobacco Never

## 2024-01-26 NOTE — Progress Notes (Deleted)
 Subjective:  Chief complaint: follow-up for HIV disease on medications   Patient ID: Adriana Fernandez, female    DOB: [birth year], 53 y.o.   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   Hypertension    Paranoid schizophrenia (HCC)    Psychosis (HCC)    Schizophrenia (HCC)    Sickle cell trait    Vaccine counseling 03/31/2023    Past Surgical History:  Procedure Laterality Date   CHOLECYSTECTOMY N/A 06/22/2016   Procedure: LAPAROSCOPIC CHOLECYSTECTOMY WITH INTRAOPERATIVE CHOLANGIOGRAM;  Surgeon: Alm Angle, MD;  Location: [facility];  Service: General;  Laterality: N/A;   ERCP N/A 06/21/2016   Procedure: ENDOSCOPIC RETROGRADE CHOLANGIOPANCREATOGRAPHY (ERCP);  Surgeon: Lupita FORBES Commander, MD;  Location: [facility];  Service: Endoscopy;  Laterality: N/A;    Family History  Problem Relation Age of Onset   GER disease Mother    Hypertension Mother    Sickle cell anemia Brother    Diabetes Maternal Aunt    Diabetes Maternal Grandmother    Breast cancer Maternal Aunt    Throat cancer Paternal Grandmother       Social History   Socioeconomic History   Marital status: Divorced    Spouse name: Not on file   Number of children: Not on file   Years of education: Not on file   Highest education level: Not on file  Occupational History   Not on file  Tobacco Use   Smoking status: Never   Smokeless tobacco: Never  Substance and Sexual Activity   Alcohol use: No   Drug use: No   Sexual activity: Yes    Partners: Male    Birth control/protection: I.U.D.  Other Topics Concern   Not on file  Social History Narrative   Not on file   Social Drivers of Health   Financial Resource Strain: Not on file  Food Insecurity: Not on file  Transportation Needs: Not on file  Physical  Activity: Not on file  Stress: Not on file  Social Connections: Not on file    Allergies  Allergen Reactions   Dilaudid  [Hydromorphone  Hcl] Itching   Latex      Current Outpatient Medications:    atenolol-chlorthalidone (TENORETIC) 50-25 MG tablet, Take 1 tablet by mouth daily., Disp: , Rfl:    BIKTARVY  50-200-25 MG TABS tablet, TAKE 1 TABLET BY MOUTH 1 TIME A DAY, Disp: 30 tablet, Rfl: 2   buPROPion  (WELLBUTRIN  SR) 150 MG 12 hr tablet, Take 1 tablet (150 mg total) by mouth 2 (two) times daily., Disp: 60 tablet, Rfl: 0   esomeprazole (NEXIUM) 40 MG capsule, Take 40 mg by mouth 2 (two) times daily., Disp: , Rfl:    levothyroxine (SYNTHROID) 25 MCG tablet, Take 25 mcg by mouth daily before breakfast., Disp: , Rfl:    lurasidone (LATUDA) 40 MG TABS tablet, Take 40 mg by mouth daily with breakfast., Disp: , Rfl:    phentermine (ADIPEX-P) 37.5 MG tablet, TAKE 1 2 (ONE HALF) TABLET BY MOUTH ONCE DAILY (Patient not taking: Reported on 04/01/2023), Disp: , Rfl:    simvastatin (ZOCOR) 20 MG tablet, Take 20 mg by mouth daily., Disp: , Rfl:    valACYclovir  (VALTREX ) 1000 MG tablet, Take 1,000 mg by mouth daily., Disp: , Rfl:  Review of Systems     Objective:   Physical Exam        Assessment & Plan:

## 2024-01-27 ENCOUNTER — Ambulatory Visit: Payer: BC Managed Care – PPO | Admitting: Infectious Disease

## 2024-01-27 DIAGNOSIS — E559 Vitamin D deficiency, unspecified: Secondary | ICD-10-CM

## 2024-01-27 DIAGNOSIS — Z113 Encounter for screening for infections with a predominantly sexual mode of transmission: Secondary | ICD-10-CM

## 2024-01-27 DIAGNOSIS — R7303 Prediabetes: Secondary | ICD-10-CM

## 2024-01-27 DIAGNOSIS — R87618 Other abnormal cytological findings on specimens from cervix uteri: Secondary | ICD-10-CM

## 2024-01-27 DIAGNOSIS — Z7185 Encounter for immunization safety counseling: Secondary | ICD-10-CM

## 2024-01-27 DIAGNOSIS — B009 Herpesviral infection, unspecified: Secondary | ICD-10-CM

## 2024-01-27 DIAGNOSIS — I10 Essential (primary) hypertension: Secondary | ICD-10-CM

## 2024-01-27 DIAGNOSIS — B20 Human immunodeficiency virus [HIV] disease: Secondary | ICD-10-CM

## 2024-03-01 ENCOUNTER — Other Ambulatory Visit: Payer: Self-pay | Admitting: Infectious Disease

## 2024-03-01 DIAGNOSIS — B20 Human immunodeficiency virus [HIV] disease: Secondary | ICD-10-CM

## 2024-03-31 ENCOUNTER — Other Ambulatory Visit: Payer: Self-pay | Admitting: Infectious Disease

## 2024-03-31 DIAGNOSIS — B20 Human immunodeficiency virus [HIV] disease: Secondary | ICD-10-CM

## 2024-03-31 NOTE — Telephone Encounter (Signed)
 Due for appt
